# Patient Record
Sex: Female | Born: 1980 | Race: White | Hispanic: No | Smoking: Never smoker
Health system: Southern US, Community
[De-identification: ages and names within clinical notes are randomized; demographics above are authoritative.]

## PROBLEM LIST (undated history)

## (undated) DIAGNOSIS — F32A Depression, unspecified: Secondary | ICD-10-CM

## (undated) DIAGNOSIS — F329 Major depressive disorder, single episode, unspecified: Secondary | ICD-10-CM

## (undated) DIAGNOSIS — K219 Gastro-esophageal reflux disease without esophagitis: Secondary | ICD-10-CM

## (undated) DIAGNOSIS — R87619 Unspecified abnormal cytological findings in specimens from cervix uteri: Secondary | ICD-10-CM

## (undated) DIAGNOSIS — Z8742 Personal history of other diseases of the female genital tract: Secondary | ICD-10-CM

## (undated) DIAGNOSIS — IMO0002 Reserved for concepts with insufficient information to code with codable children: Secondary | ICD-10-CM

## (undated) HISTORY — DX: Gastro-esophageal reflux disease without esophagitis: K21.9

## (undated) HISTORY — DX: Depression, unspecified: F32.A

## (undated) HISTORY — DX: Major depressive disorder, single episode, unspecified: F32.9

## (undated) HISTORY — PX: CHOLECYSTECTOMY: SHX55

---

## 2010-08-02 ENCOUNTER — Other Ambulatory Visit (HOSPITAL_COMMUNITY)
Admission: RE | Admit: 2010-08-02 | Discharge: 2010-08-02 | Disposition: A | Discharge status: Home / Self Care | Payer: 59 | Source: Ambulatory Visit | Attending: Obstetrics and Gynecology | Admitting: Obstetrics and Gynecology

## 2010-08-02 DIAGNOSIS — Z113 Encounter for screening for infections with a predominantly sexual mode of transmission: Secondary | ICD-10-CM | POA: Insufficient documentation

## 2010-08-02 DIAGNOSIS — Z01419 Encounter for gynecological examination (general) (routine) without abnormal findings: Secondary | ICD-10-CM | POA: Insufficient documentation

## 2010-08-02 LAB — HIV ANTIBODY (ROUTINE TESTING W REFLEX): HIV: NONREACTIVE

## 2010-08-02 LAB — RPR: RPR: NONREACTIVE

## 2010-08-02 LAB — ABO/RH

## 2010-08-02 LAB — RUBELLA ANTIBODY, IGM: Rubella: IMMUNE

## 2011-02-21 ENCOUNTER — Other Ambulatory Visit: Payer: Self-pay | Admitting: Obstetrics and Gynecology

## 2011-02-23 ENCOUNTER — Encounter (HOSPITAL_COMMUNITY): Payer: Self-pay

## 2011-02-25 ENCOUNTER — Inpatient Hospital Stay (HOSPITAL_COMMUNITY): Payer: 59 | Admitting: Anesthesiology

## 2011-02-25 ENCOUNTER — Encounter (HOSPITAL_COMMUNITY): Payer: Self-pay

## 2011-02-25 ENCOUNTER — Inpatient Hospital Stay (HOSPITAL_COMMUNITY)
Admission: AD | Admit: 2011-02-25 | Discharge: 2011-03-01 | DRG: 765 | Disposition: A | Discharge status: Home / Self Care | Payer: 59 | Source: Ambulatory Visit | Attending: Obstetrics and Gynecology | Admitting: Obstetrics and Gynecology

## 2011-02-25 ENCOUNTER — Inpatient Hospital Stay (HOSPITAL_COMMUNITY): Admission: RE | Admit: 2011-02-25 | Payer: 59 | Source: Ambulatory Visit

## 2011-02-25 ENCOUNTER — Other Ambulatory Visit: Payer: Self-pay | Admitting: Obstetrics and Gynecology

## 2011-02-25 ENCOUNTER — Encounter (HOSPITAL_COMMUNITY)
Admission: AD | Disposition: A | Discharge status: Home / Self Care | Payer: Self-pay | Source: Ambulatory Visit | Attending: Obstetrics and Gynecology

## 2011-02-25 ENCOUNTER — Encounter (HOSPITAL_COMMUNITY): Payer: Self-pay | Admitting: *Deleted

## 2011-02-25 ENCOUNTER — Encounter (HOSPITAL_COMMUNITY): Payer: Self-pay | Admitting: Pharmacist

## 2011-02-25 ENCOUNTER — Encounter (HOSPITAL_COMMUNITY): Payer: Self-pay | Admitting: Anesthesiology

## 2011-02-25 DIAGNOSIS — O321XX0 Maternal care for breech presentation, not applicable or unspecified: Principal | ICD-10-CM | POA: Diagnosis present

## 2011-02-25 DIAGNOSIS — O139 Gestational [pregnancy-induced] hypertension without significant proteinuria, unspecified trimester: Secondary | ICD-10-CM | POA: Diagnosis present

## 2011-02-25 HISTORY — DX: Reserved for concepts with insufficient information to code with codable children: IMO0002

## 2011-02-25 HISTORY — DX: Personal history of other diseases of the female genital tract: Z87.42

## 2011-02-25 HISTORY — DX: Unspecified abnormal cytological findings in specimens from cervix uteri: R87.619

## 2011-02-25 LAB — CBC
HCT: 33.9 % — ABNORMAL LOW (ref 36.0–46.0)
Hemoglobin: 11.2 g/dL — ABNORMAL LOW (ref 12.0–15.0)
MCHC: 33 g/dL (ref 30.0–36.0)
Platelets: 242 10*3/uL (ref 150–400)
RBC: 3.92 MIL/uL (ref 3.87–5.11)
RDW: 14.6 % (ref 11.5–15.5)
WBC: 8.4 10*3/uL (ref 4.0–10.5)
WBC: 18.1 10*3/uL — ABNORMAL HIGH (ref 4.0–10.5)

## 2011-02-25 LAB — URINALYSIS, ROUTINE W REFLEX MICROSCOPIC
Bilirubin Urine: NEGATIVE
Ketones, ur: 40 mg/dL — AB
Nitrite: NEGATIVE
Urobilinogen, UA: 0.2 mg/dL (ref 0.0–1.0)

## 2011-02-25 LAB — URINE MICROSCOPIC-ADD ON

## 2011-02-25 LAB — COMPREHENSIVE METABOLIC PANEL
ALT: 69 U/L — ABNORMAL HIGH (ref 0–35)
Alkaline Phosphatase: 152 U/L — ABNORMAL HIGH (ref 39–117)
BUN: 12 mg/dL (ref 6–23)
CO2: 23 mEq/L (ref 19–32)
Chloride: 102 mEq/L (ref 96–112)
GFR calc Af Amer: >90 mL/min (ref >90)
GFR calc non Af Amer: >90 mL/min (ref >90)
Glucose, Bld: 92 mg/dL (ref 70–99)
Potassium: 3.7 mEq/L (ref 3.5–5.1)
Sodium: 135 mEq/L (ref 135–145)
Total Bilirubin: 0.3 mg/dL (ref 0.3–1.2)

## 2011-02-25 LAB — TYPE AND SCREEN: Antibody Screen: NEGATIVE

## 2011-02-25 LAB — URIC ACID: Uric Acid, Serum: 6.8 mg/dL (ref 2.4–7.0)

## 2011-02-25 SURGERY — Surgical Case
Anesthesia: Spinal | Site: Abdomen | Wound class: Clean Contaminated

## 2011-02-25 MED ORDER — METOCLOPRAMIDE HCL 5 MG/ML IJ SOLN: 10.0000 mg | Freq: Three times a day (TID) | INTRAMUSCULAR | Status: DC | PRN

## 2011-02-25 MED ORDER — CEFAZOLIN SODIUM 1-5 GM-% IV SOLN
1.0000 g | INTRAVENOUS | Status: AC
  Administered 2011-02-25: 1 g via INTRAVENOUS
  Filled 2011-02-25: qty 50

## 2011-02-25 MED ORDER — EPHEDRINE SULFATE 50 MG/ML IJ SOLN
INTRAMUSCULAR | Status: DC | PRN
  Administered 2011-02-25: 10 mg via INTRAVENOUS
  Administered 2011-02-25: 5 mg via INTRAVENOUS
  Administered 2011-02-25: 10 mg via INTRAVENOUS
  Administered 2011-02-25: 5 mg via INTRAVENOUS

## 2011-02-25 MED ORDER — ONDANSETRON HCL 4 MG/2ML IJ SOLN
INTRAMUSCULAR | Status: AC
  Filled 2011-02-25: qty 2

## 2011-02-25 MED ORDER — PHENYLEPHRINE HCL 10 MG/ML IJ SOLN
INTRAMUSCULAR | Status: DC | PRN
  Administered 2011-02-25: 80 ug via INTRAVENOUS
  Administered 2011-02-25 (×3): 40 ug via INTRAVENOUS

## 2011-02-25 MED ORDER — FENTANYL CITRATE 0.05 MG/ML IJ SOLN
INTRAMUSCULAR | Status: AC
  Administered 2011-02-25: 50 ug via INTRAVENOUS
  Filled 2011-02-25: qty 2

## 2011-02-25 MED ORDER — SCOPOLAMINE 1 MG/3DAYS TD PT72
MEDICATED_PATCH | TRANSDERMAL | Status: AC
  Filled 2011-02-25: qty 1

## 2011-02-25 MED ORDER — OXYTOCIN 10 UNIT/ML IJ SOLN
INTRAMUSCULAR | Status: AC
  Filled 2011-02-25: qty 2

## 2011-02-25 MED ORDER — DIPHENHYDRAMINE HCL 50 MG/ML IJ SOLN: 12.5000 mg | INTRAMUSCULAR | Status: DC | PRN

## 2011-02-25 MED ORDER — PHENYLEPHRINE 40 MCG/ML (10ML) SYRINGE FOR IV PUSH (FOR BLOOD PRESSURE SUPPORT)
PREFILLED_SYRINGE | INTRAVENOUS | Status: AC
  Filled 2011-02-25: qty 5

## 2011-02-25 MED ORDER — OXYCODONE-ACETAMINOPHEN 5-325 MG PO TABS
1.0000 | ORAL_TABLET | ORAL | Status: DC | PRN
  Administered 2011-02-26: 1 via ORAL
  Administered 2011-02-26: 2 via ORAL
  Administered 2011-02-26: 1 via ORAL
  Administered 2011-02-27 – 2011-02-28 (×6): 2 via ORAL
  Administered 2011-02-28 (×2): 1 via ORAL
  Administered 2011-03-01 (×2): 2 via ORAL
  Filled 2011-02-25: qty 1
  Filled 2011-02-25: qty 2
  Filled 2011-02-25: qty 1
  Filled 2011-02-25: qty 2
  Filled 2011-02-25: qty 1
  Filled 2011-02-25 (×2): qty 2
  Filled 2011-02-25: qty 1
  Filled 2011-02-25 (×2): qty 2
  Filled 2011-02-25 (×2): qty 1
  Filled 2011-02-25 (×2): qty 2

## 2011-02-25 MED ORDER — OXYTOCIN 20 UNITS IN LACTATED RINGERS INFUSION - SIMPLE
125.0000 mL/h | INTRAVENOUS | Status: AC
  Administered 2011-02-26: 125 mL/h via INTRAVENOUS
  Filled 2011-02-25 (×2): qty 1000

## 2011-02-25 MED ORDER — LACTATED RINGERS IV SOLN
INTRAVENOUS | Status: DC | PRN
  Administered 2011-02-25: 14:00:00 via INTRAVENOUS

## 2011-02-25 MED ORDER — KETOROLAC TROMETHAMINE 30 MG/ML IJ SOLN
30.0000 mg | Freq: Four times a day (QID) | INTRAMUSCULAR | Status: AC | PRN
  Filled 2011-02-25: qty 1

## 2011-02-25 MED ORDER — DIPHENHYDRAMINE HCL 25 MG PO CAPS
25.0000 mg | ORAL_CAPSULE | ORAL | Status: DC | PRN
  Administered 2011-02-26: 25 mg via ORAL
  Filled 2011-02-25: qty 1

## 2011-02-25 MED ORDER — HYDROMORPHONE HCL PF 1 MG/ML IJ SOLN
0.5000 mg | INTRAMUSCULAR | Status: AC
  Administered 2011-02-25 (×2): 0.5 mg via INTRAVENOUS

## 2011-02-25 MED ORDER — MEPERIDINE HCL 25 MG/ML IJ SOLN
INTRAMUSCULAR | Status: AC
  Administered 2011-02-25: 6.25 mg via INTRAVENOUS
  Filled 2011-02-25: qty 1

## 2011-02-25 MED ORDER — KETOROLAC TROMETHAMINE 60 MG/2ML IM SOLN
60.0000 mg | Freq: Once | INTRAMUSCULAR | Status: AC | PRN
  Administered 2011-02-25: 60 mg via INTRAMUSCULAR

## 2011-02-25 MED ORDER — SIMETHICONE 80 MG PO CHEW: 80.0000 mg | CHEWABLE_TABLET | ORAL | Status: DC | PRN

## 2011-02-25 MED ORDER — LACTATED RINGERS IV SOLN: INTRAVENOUS | Status: DC

## 2011-02-25 MED ORDER — LANOLIN HYDROUS EX OINT: 1.0000 "application " | TOPICAL_OINTMENT | CUTANEOUS | Status: DC | PRN

## 2011-02-25 MED ORDER — HYDROMORPHONE HCL PF 1 MG/ML IJ SOLN
INTRAMUSCULAR | Status: AC
  Administered 2011-02-25: 0.5 mg via INTRAVENOUS
  Filled 2011-02-25: qty 1

## 2011-02-25 MED ORDER — MORPHINE SULFATE (PF) 0.5 MG/ML IJ SOLN
INTRAMUSCULAR | Status: DC | PRN
  Administered 2011-02-25: .1 mg via INTRATHECAL

## 2011-02-25 MED ORDER — SCOPOLAMINE 1 MG/3DAYS TD PT72
1.0000 | MEDICATED_PATCH | Freq: Once | TRANSDERMAL | Status: AC
  Administered 2011-02-25: 1.5 mg via TRANSDERMAL

## 2011-02-25 MED ORDER — TETANUS-DIPHTH-ACELL PERTUSSIS 5-2.5-18.5 LF-MCG/0.5 IM SUSP
0.5000 mL | Freq: Once | INTRAMUSCULAR | Status: AC
  Administered 2011-02-26: 0.5 mL via INTRAMUSCULAR
  Filled 2011-02-25 (×2): qty 0.5

## 2011-02-25 MED ORDER — DIPHENHYDRAMINE HCL 50 MG/ML IJ SOLN: 25.0000 mg | INTRAMUSCULAR | Status: DC | PRN

## 2011-02-25 MED ORDER — BUPIVACAINE IN DEXTROSE 0.75-8.25 % IT SOLN
INTRATHECAL | Status: DC | PRN
  Administered 2011-02-25: 11.75 mg via INTRATHECAL

## 2011-02-25 MED ORDER — OXYTOCIN 20 UNITS IN LACTATED RINGERS INFUSION - SIMPLE
INTRAVENOUS | Status: DC | PRN
  Administered 2011-02-25: 20 [IU] via INTRAVENOUS

## 2011-02-25 MED ORDER — OXYTOCIN 20 UNITS IN LACTATED RINGERS INFUSION - SIMPLE
INTRAVENOUS | Status: AC
  Administered 2011-02-25: 20 [IU] via INTRAVENOUS
  Filled 2011-02-25: qty 1000

## 2011-02-25 MED ORDER — PRENATAL MULTIVITAMIN CH
1.0000 | ORAL_TABLET | Freq: Every day | ORAL | Status: DC
  Administered 2011-02-26 – 2011-03-01 (×4): 1 via ORAL
  Filled 2011-02-25 (×4): qty 1

## 2011-02-25 MED ORDER — SIMETHICONE 80 MG PO CHEW
80.0000 mg | CHEWABLE_TABLET | Freq: Three times a day (TID) | ORAL | Status: DC
  Administered 2011-02-25 – 2011-03-01 (×13): 80 mg via ORAL

## 2011-02-25 MED ORDER — DIBUCAINE 1 % RE OINT: 1.0000 "application " | TOPICAL_OINTMENT | RECTAL | Status: DC | PRN

## 2011-02-25 MED ORDER — KETOROLAC TROMETHAMINE 60 MG/2ML IM SOLN
INTRAMUSCULAR | Status: AC
  Administered 2011-02-25: 60 mg via INTRAMUSCULAR
  Filled 2011-02-25: qty 2

## 2011-02-25 MED ORDER — ONDANSETRON HCL 4 MG/2ML IJ SOLN: 4.0000 mg | INTRAMUSCULAR | Status: DC | PRN

## 2011-02-25 MED ORDER — IBUPROFEN 600 MG PO TABS
600.0000 mg | ORAL_TABLET | Freq: Four times a day (QID) | ORAL | Status: DC
  Administered 2011-02-26 – 2011-03-01 (×15): 600 mg via ORAL
  Filled 2011-02-25 (×11): qty 1
  Filled 2011-02-25: qty 2
  Filled 2011-02-25 (×2): qty 1

## 2011-02-25 MED ORDER — ONDANSETRON HCL 4 MG/2ML IJ SOLN: 4.0000 mg | Freq: Three times a day (TID) | INTRAMUSCULAR | Status: DC | PRN

## 2011-02-25 MED ORDER — SENNOSIDES-DOCUSATE SODIUM 8.6-50 MG PO TABS
2.0000 | ORAL_TABLET | Freq: Every day | ORAL | Status: DC
  Administered 2011-02-25 – 2011-02-28 (×4): 2 via ORAL

## 2011-02-25 MED ORDER — MEPERIDINE HCL 25 MG/ML IJ SOLN
6.2500 mg | INTRAMUSCULAR | Status: DC | PRN
  Administered 2011-02-25: 6.25 mg via INTRAVENOUS

## 2011-02-25 MED ORDER — CEFAZOLIN SODIUM 1-5 GM-% IV SOLN
1.0000 g | Freq: Four times a day (QID) | INTRAVENOUS | Status: AC
  Administered 2011-02-25 – 2011-02-26 (×2): 1 g via INTRAVENOUS
  Filled 2011-02-25 (×2): qty 50

## 2011-02-25 MED ORDER — EPHEDRINE 5 MG/ML INJ
INTRAVENOUS | Status: AC
  Filled 2011-02-25: qty 10

## 2011-02-25 MED ORDER — SODIUM CHLORIDE 0.9 % IJ SOLN
3.0000 mL | INTRAMUSCULAR | Status: DC | PRN
  Administered 2011-02-26: 3 mL via INTRAVENOUS

## 2011-02-25 MED ORDER — ONDANSETRON HCL 4 MG PO TABS: 4.0000 mg | ORAL_TABLET | ORAL | Status: DC | PRN

## 2011-02-25 MED ORDER — CITRIC ACID-SODIUM CITRATE 334-500 MG/5ML PO SOLN
30.0000 mL | Freq: Once | ORAL | Status: AC
  Administered 2011-02-25: 30 mL via ORAL
  Filled 2011-02-25: qty 15

## 2011-02-25 MED ORDER — CEFAZOLIN SODIUM 1-5 GM-% IV SOLN
1.0000 g | Freq: Four times a day (QID) | INTRAVENOUS | Status: DC
  Filled 2011-02-25: qty 50

## 2011-02-25 MED ORDER — FAMOTIDINE IN NACL 20-0.9 MG/50ML-% IV SOLN
20.0000 mg | Freq: Once | INTRAVENOUS | Status: AC
  Administered 2011-02-25: 20 mg via INTRAVENOUS
  Filled 2011-02-25: qty 50

## 2011-02-25 MED ORDER — ZOLPIDEM TARTRATE 5 MG PO TABS: 5.0000 mg | ORAL_TABLET | Freq: Every evening | ORAL | Status: DC | PRN

## 2011-02-25 MED ORDER — FENTANYL CITRATE 0.05 MG/ML IJ SOLN
INTRAMUSCULAR | Status: DC | PRN
  Administered 2011-02-25: 15 ug via INTRATHECAL

## 2011-02-25 MED ORDER — SODIUM CHLORIDE 0.9 % IV SOLN
1.0000 ug/kg/h | INTRAVENOUS | Status: DC | PRN
  Filled 2011-02-25: qty 2.5

## 2011-02-25 MED ORDER — ONDANSETRON HCL 4 MG/2ML IJ SOLN
INTRAMUSCULAR | Status: DC | PRN
  Administered 2011-02-25: 4 mg via INTRAVENOUS

## 2011-02-25 MED ORDER — MENTHOL 3 MG MT LOZG: 1.0000 | LOZENGE | OROMUCOSAL | Status: DC | PRN

## 2011-02-25 MED ORDER — DIPHENHYDRAMINE HCL 25 MG PO CAPS: 25.0000 mg | ORAL_CAPSULE | Freq: Four times a day (QID) | ORAL | Status: DC | PRN

## 2011-02-25 MED ORDER — NALOXONE HCL 0.4 MG/ML IJ SOLN: 0.4000 mg | INTRAMUSCULAR | Status: DC | PRN

## 2011-02-25 MED ORDER — IBUPROFEN 600 MG PO TABS: 600.0000 mg | ORAL_TABLET | Freq: Four times a day (QID) | ORAL | Status: DC | PRN

## 2011-02-25 MED ORDER — WITCH HAZEL-GLYCERIN EX PADS: 1.0000 "application " | MEDICATED_PAD | CUTANEOUS | Status: DC | PRN

## 2011-02-25 MED ORDER — NALBUPHINE HCL 10 MG/ML IJ SOLN: 5.0000 mg | INTRAMUSCULAR | Status: DC | PRN

## 2011-02-25 MED ORDER — FENTANYL CITRATE 0.05 MG/ML IJ SOLN
25.0000 ug | INTRAMUSCULAR | Status: DC | PRN
  Administered 2011-02-25 (×2): 50 ug via INTRAVENOUS

## 2011-02-25 MED ORDER — MAGNESIUM SULFATE 40 G IN LACTATED RINGERS - SIMPLE
2.0000 g/h | INTRAVENOUS | Status: DC
  Filled 2011-02-25 (×2): qty 500

## 2011-02-25 MED ORDER — MAGNESIUM SULFATE BOLUS VIA INFUSION
4.0000 g | Freq: Once | INTRAVENOUS | Status: AC
  Administered 2011-02-25: 4 g via INTRAVENOUS
  Filled 2011-02-25: qty 500

## 2011-02-25 SURGICAL SUPPLY — 27 items
CLOTH BEACON ORANGE TIMEOUT ST (SAFETY) ×2 IMPLANT
CONTAINER PREFILL 10% NBF 15ML (MISCELLANEOUS) IMPLANT
DRESSING TELFA 8X3 (GAUZE/BANDAGES/DRESSINGS) IMPLANT
DRSG COVADERM 4X10 (GAUZE/BANDAGES/DRESSINGS) ×2 IMPLANT
DRSG PAD ABDOMINAL 8X10 ST (GAUZE/BANDAGES/DRESSINGS) ×2 IMPLANT
ELECT REM PT RETURN 9FT ADLT (ELECTROSURGICAL) ×2
ELECTRODE REM PT RTRN 9FT ADLT (ELECTROSURGICAL) ×1 IMPLANT
EXTRACTOR VACUUM M CUP 4 TUBE (SUCTIONS) IMPLANT
GAUZE SPONGE 4X4 12PLY STRL LF (GAUZE/BANDAGES/DRESSINGS) ×2 IMPLANT
GLOVE BIO SURGEON STRL SZ8 (GLOVE) ×4 IMPLANT
GOWN PREVENTION PLUS LG XLONG (DISPOSABLE) ×2 IMPLANT
KIT ABG SYR 3ML LUER SLIP (SYRINGE) ×2 IMPLANT
NEEDLE HYPO 25X5/8 SAFETYGLIDE (NEEDLE) ×2 IMPLANT
NS IRRIG 1000ML POUR BTL (IV SOLUTION) ×2 IMPLANT
PACK C SECTION WH (CUSTOM PROCEDURE TRAY) ×2 IMPLANT
PAD ABD 7.5X8 STRL (GAUZE/BANDAGES/DRESSINGS) IMPLANT
SLEEVE SCD COMPRESS KNEE MED (MISCELLANEOUS) IMPLANT
STAPLER VISISTAT 35W (STAPLE) IMPLANT
STRIP CLOSURE SKIN 1/4X4 (GAUZE/BANDAGES/DRESSINGS) ×2 IMPLANT
SUT MNCRL 0 VIOLET CTX 36 (SUTURE) ×4 IMPLANT
SUT MONOCRYL 0 CTX 36 (SUTURE) ×4
SUT PDS AB 0 CTX 60 (SUTURE) ×2 IMPLANT
SUT PLAIN 0 NONE (SUTURE) IMPLANT
TAPE CLOTH SURG 4X10 WHT LF (GAUZE/BANDAGES/DRESSINGS) ×2 IMPLANT
TOWEL OR 17X24 6PK STRL BLUE (TOWEL DISPOSABLE) ×4 IMPLANT
TRAY FOLEY CATH 14FR (SET/KITS/TRAYS/PACK) ×2 IMPLANT
WATER STERILE IRR 1000ML POUR (IV SOLUTION) ×2 IMPLANT

## 2011-02-25 NOTE — Anesthesia Procedure Notes (Signed)
Spinal  Patient location during procedure: OR Start time: 02/25/2011 1:50 PM Staffing Performed by: anesthesiologist  Preanesthetic Checklist Completed: patient identified, site marked, surgical consent, pre-op evaluation, timeout performed, IV checked, risks and benefits discussed and monitors and equipment checked Spinal Block Patient position: sitting Prep: site prepped and draped and DuraPrep Patient monitoring: heart rate, cardiac monitor, continuous pulse ox and blood pressure Approach: midline Location: L3-4 Injection technique: single-shot Needle Needle type: Sprotte  Needle gauge: 24 G Needle length: 9 cm Assessment Sensory level: T4 Additional Notes Clear free flow CSF on first attempt.  Patient tolerated procedure well.  Jasmine December, MD

## 2011-02-25 NOTE — Addendum Note (Signed)
Addendum  created 02/25/11 1737 by Aadit Hagood L. Rodman Pickle, MD   Modules edited:Orders, PRL Based Order Sets

## 2011-02-25 NOTE — Brief Op Note (Signed)
02/25/2011  2:29 PM  PATIENT:  Jessica Barron  31 y.o. female  PRE-OPERATIVE DIAGNOSIS:  Breech in Labor  POST-OPERATIVE DIAGNOSIS:  Breech in Labor  PROCEDURE:  Procedure(s): Low Transverse CESAREAN SECTION  SURGEON:  Surgeon(s): Roselle Locus II, MD  PHYSICIAN ASSISTANT:   ASSISTANTS: none   ANESTHESIA:   spinal  EBL:  Total I/O In: 1200 [I.V.:1200] Out: 800 [Urine:50; Blood:750]  BLOOD ADMINISTERED:none  DRAINS: Urinary Catheter (Foley)   LOCAL MEDICATIONS USED:  NONE  SPECIMEN:  Source of Specimen:  placenta  DISPOSITION OF SPECIMEN:  PATHOLOGY  COUNTS:  YES  TOURNIQUET:  * No tourniquets in log *  DICTATION: .Other Dictation: Dictation Number [id number]  PLAN OF CARE: Admit to inpatient   PATIENT DISPOSITION:  PACU - hemodynamically stable.   Delay start of Pharmacological VTE agent (>24hrs) due to surgical blood loss or risk of bleeding:  {YES/NO/NOT APPLICABLE:20182

## 2011-02-25 NOTE — Transfer of Care (Signed)
Immediate Anesthesia Transfer of Care Note  Patient: Jessica Barron  Procedure(s) Performed:  CESAREAN SECTION  Patient Location: PACU  Anesthesia Type: Spinal  Level of Consciousness: awake, alert  and oriented  Airway & Oxygen Therapy: Patient Spontanous Breathing  Post-op Assessment: Report given to PACU RN and Post -op Vital signs reviewed and stable  Post vital signs: Reviewed and stable  Complications: No apparent anesthesia complications

## 2011-02-25 NOTE — Anesthesia Postprocedure Evaluation (Signed)
Anesthesia Post Note  Patient: Jessica Barron  Procedure(s) Performed:  CESAREAN SECTION  Anesthesia type: Spinal  Patient location: PACU  Post pain: Pain level controlled  Post assessment: Post-op Vital signs reviewed  Last Vitals:  Filed Vitals:   02/25/11 1500  BP: 138/74  Pulse: 73  Temp:   Resp: 16    Post vital signs: Reviewed  Level of consciousness: awake  Complications: No apparent anesthesia complications

## 2011-02-25 NOTE — H&P (Signed)
Jessica Barron is a 31 y.o. female presenting for cervix at 6 cm per Dr Lynnell Dike exam in office today.  No ROM/CNS change/bleeding/epigastric pain.  Pt known to be breech on multiple exams in office and today with Dr Vincente Poli. Maternal Medical History:  Contractions: Frequency: irregular.   Perceived severity is moderate.    Fetal activity: Perceived fetal activity is normal.      OB History    Grav Para Term Preterm Abortions TAB SAB Ect Mult Living   1 0 0 0 0 0 0 0 0 0      Past Medical History  Diagnosis Date  . Abnormal Pap smear     repeat WNL  . History of PCOS   . Breech presentation current preg edc 03/10/11   Past Surgical History  Procedure Date  . Cholecystectomy    Family History: family history is negative for Anesthesia problems. Social History:  reports that she has never smoked. She does not have any smokeless tobacco history on file. She reports that she does not drink alcohol or use illicit drugs.  Review of Systems  Eyes: Negative for blurred vision.  Gastrointestinal: Negative for abdominal pain.  Neurological: Negative for headaches.    Dilation: 6 Exam by:: in office by Dr. Eustaquio Boyden Blood pressure 147/86, pulse 88, last menstrual period 06/03/2010. Maternal Exam:  Uterine Assessment: Contraction strength is moderate.  Contraction frequency is irregular.   Abdomen: Patient reports no abdominal tenderness.   Fetal Exam Fetal Monitor Review: Pattern: accelerations present.       Physical Exam  Cardiovascular: Normal rate and regular rhythm.   Respiratory: Effort normal and breath sounds normal.  Neurological: She has normal reflexes.    Prenatal labs: ABO, Rh: O/Positive/-- (07/09 1049) Antibody:   Rubella: Immune (07/09 1049) RPR: Nonreactive (07/09 1049)  HBsAg: Negative (07/09 1049)  HIV: Non-reactive (07/09 1049)  GBS: Negative (01/11 0000)   Assessment/Plan: 31 yo G1P0 at 57 1/7 weeks with breech and 6 cm cervix.  D/W pt/husband  surgery and risks-infection, bleeding / transfusion-HIV/Hep, organ damage, DVT/PE, pneumonia.  All questions answered.  Infant Doane II,Elizabeht Suto E 02/25/2011, 1:29 PM

## 2011-02-25 NOTE — Anesthesia Preprocedure Evaluation (Signed)
Anesthesia Evaluation  Patient identified by MRN, date of birth, ID band Patient awake    Reviewed: Allergy & Precautions, H&P , NPO status , Patient's Chart, lab work & pertinent test results, reviewed documented beta blocker date and time   History of Anesthesia Complications Negative for: history of anesthetic complications  Airway Mallampati: II TM Distance: >3 FB Neck ROM: full    Dental  (+) Teeth Intact   Pulmonary sleep apnea ("mild". no CPAP) ,  clear to auscultation        Cardiovascular neg cardio ROS regular Normal    Neuro/Psych Negative Neurological ROS  Negative Psych ROS   GI/Hepatic negative GI ROS, Neg liver ROS,   Endo/Other  Morbid obesityPCOS  Renal/GU negative Renal ROS  Genitourinary negative   Musculoskeletal   Abdominal   Peds  Hematology negative hematology ROS (+)   Anesthesia Other Findings Ate at 7 am  Reproductive/Obstetrics (+) Pregnancy (breech, in labor)                           Anesthesia Physical Anesthesia Plan  ASA: III and Emergent  Anesthesia Plan: Spinal   Post-op Pain Management:    Induction:   Airway Management Planned:   Additional Equipment:   Intra-op Plan:   Post-operative Plan:   Informed Consent: I have reviewed the patients History and Physical, chart, labs and discussed the procedure including the risks, benefits and alternatives for the proposed anesthesia with the patient or authorized representative who has indicated his/her understanding and acceptance.   Dental Advisory Given  Plan Discussed with: Surgeon and CRNA  Anesthesia Plan Comments:         Anesthesia Quick Evaluation

## 2011-02-25 NOTE — Progress Notes (Signed)
No HA/vision change/epigastric pain  BPs steadily rising in PACU  Blood pressure 158/89, pulse 86, temperature 98.2 F (36.8 C), temperature source Oral, resp. rate 20, height 5\' 2"  (1.575 m), weight 124.286 kg (274 lb), last menstrual period 06/03/2010, SpO2 98.00%, unknown if currently breastfeeding.  Lungs CTA Abd no epigastric tenderness DTR  2+, no clonus  Results for orders placed during the hospital encounter of 02/25/11 (from the past 24 hour(s))  CBC     Status: Normal   Collection Time   02/25/11 12:39 PM      Component Value Range   WBC 8.4  4.0 - 10.5 (K/uL)   RBC 4.26  3.87 - 5.11 (MIL/uL)   Hemoglobin 12.3  12.0 - 15.0 (g/dL)   HCT 98.1  19.1 - 47.8 (%)   MCV 86.6  78.0 - 100.0 (fL)   MCH 28.9  26.0 - 34.0 (pg)   MCHC 33.3  30.0 - 36.0 (g/dL)   RDW 29.5  62.1 - 30.8 (%)   Platelets 242  150 - 400 (K/uL)  TYPE AND SCREEN     Status: Normal   Collection Time   02/25/11 12:39 PM      Component Value Range   ABO/RH(D) O POS     Antibody Screen NEG     Sample Expiration 02/28/2011    ABO/RH     Status: Normal   Collection Time   02/25/11 12:39 PM      Component Value Range   ABO/RH(D) O POS    URINALYSIS, ROUTINE W REFLEX MICROSCOPIC     Status: Abnormal   Collection Time   02/25/11  5:00 PM      Component Value Range   Color, Urine AMBER (*) YELLOW    APPearance HAZY (*) CLEAR    Specific Gravity, Urine 1.025  1.005 - 1.030    pH 6.5  5.0 - 8.0    Glucose, UA NEGATIVE  NEGATIVE (mg/dL)   Hgb urine dipstick MODERATE (*) NEGATIVE    Bilirubin Urine NEGATIVE  NEGATIVE    Ketones, ur 40 (*) NEGATIVE (mg/dL)   Protein, ur 657 (*) NEGATIVE (mg/dL)   Urobilinogen, UA 0.2  0.0 - 1.0 (mg/dL)   Nitrite NEGATIVE  NEGATIVE    Leukocytes, UA NEGATIVE  NEGATIVE   URINE MICROSCOPIC-ADD ON     Status: Abnormal   Collection Time   02/25/11  5:00 PM      Component Value Range   Squamous Epithelial / LPF MANY (*) RARE    WBC, UA 7-10  <3 (WBC/hpf)   RBC / HPF 21-50  <3  (RBC/hpf)   Bacteria, UA MANY (*) RARE    Urine-Other MUCOUS PRESENT    CBC     Status: Abnormal   Collection Time   02/25/11  5:01 PM      Component Value Range   WBC 18.1 (*) 4.0 - 10.5 (K/uL)   RBC 3.92  3.87 - 5.11 (MIL/uL)   Hemoglobin 11.2 (*) 12.0 - 15.0 (g/dL)   HCT 84.6 (*) 96.2 - 46.0 (%)   MCV 86.5  78.0 - 100.0 (fL)   MCH 28.6  26.0 - 34.0 (pg)   MCHC 33.0  30.0 - 36.0 (g/dL)   RDW 95.2  84.1 - 32.4 (%)   Platelets 219  150 - 400 (K/uL)  COMPREHENSIVE METABOLIC PANEL     Status: Abnormal   Collection Time   02/25/11  5:01 PM      Component Value Range  Sodium 135  135 - 145 (mEq/L)   Potassium 3.7  3.5 - 5.1 (mEq/L)   Chloride 102  96 - 112 (mEq/L)   CO2 23  19 - 32 (mEq/L)   Glucose, Bld 92  70 - 99 (mg/dL)   BUN 12  6 - 23 (mg/dL)   Creatinine, Ser 2.13  0.50 - 1.10 (mg/dL)   Calcium 9.3  8.4 - 08.6 (mg/dL)   Total Protein 5.6 (*) 6.0 - 8.3 (g/dL)   Albumin 2.4 (*) 3.5 - 5.2 (g/dL)   AST 48 (*) 0 - 37 (U/L)   ALT 69 (*) 0 - 35 (U/L)   Alkaline Phosphatase 152 (*) 39 - 117 (U/L)   Total Bilirubin 0.3  0.3 - 1.2 (mg/dL)   GFR calc non Af Amer >90  >90 (mL/min)   GFR calc Af Amer >90  >90 (mL/min)  URIC ACID     Status: Normal   Collection Time   02/25/11  5:01 PM      Component Value Range   Uric Acid, Serum 6.8  2.4 - 7.0 (mg/dL)  A: PP preeclampsia with elevated LFT and proteinuria  P: D/W pt/husband     Will begin magnesium sulfate seizure prophylaxis     Check labs in am

## 2011-02-26 LAB — COMPREHENSIVE METABOLIC PANEL
ALT: 65 U/L — ABNORMAL HIGH (ref 0–35)
ALT: 62 U/L — ABNORMAL HIGH (ref 0–35)
Albumin: 1.9 g/dL — ABNORMAL LOW (ref 3.5–5.2)
Alkaline Phosphatase: 133 U/L — ABNORMAL HIGH (ref 39–117)
Alkaline Phosphatase: 130 U/L — ABNORMAL HIGH (ref 39–117)
BUN: 8 mg/dL (ref 6–23)
CO2: 26 mEq/L (ref 19–32)
Calcium: 7.7 mg/dL — ABNORMAL LOW (ref 8.4–10.5)
Creatinine, Ser: 0.77 mg/dL (ref 0.50–1.10)
GFR calc Af Amer: >90 mL/min (ref >90)
Glucose, Bld: 76 mg/dL (ref 70–99)
Glucose, Bld: 88 mg/dL (ref 70–99)
Potassium: 4.1 mEq/L (ref 3.5–5.1)
Sodium: 134 mEq/L — ABNORMAL LOW (ref 135–145)
Total Bilirubin: 0.3 mg/dL (ref 0.3–1.2)
Total Protein: 4.6 g/dL — ABNORMAL LOW (ref 6.0–8.3)

## 2011-02-26 LAB — CBC
HCT: 29.8 % — ABNORMAL LOW (ref 36.0–46.0)
Hemoglobin: 9.9 g/dL — ABNORMAL LOW (ref 12.0–15.0)
Hemoglobin: 9.6 g/dL — ABNORMAL LOW (ref 12.0–15.0)
MCH: 28.3 pg (ref 26.0–34.0)
MCHC: 31.8 g/dL (ref 30.0–36.0)
MCV: 87.9 fL (ref 78.0–100.0)
RBC: 3.39 MIL/uL — ABNORMAL LOW (ref 3.87–5.11)

## 2011-02-26 LAB — URIC ACID: Uric Acid, Serum: 7 mg/dL (ref 2.4–7.0)

## 2011-02-26 MED ORDER — MAGNESIUM SULFATE 40 G IN LACTATED RINGERS - SIMPLE
2.0000 g/h | INTRAVENOUS | Status: DC
  Administered 2011-02-26: 2 g/h via INTRAVENOUS

## 2011-02-26 NOTE — Progress Notes (Signed)
Ambulated to restroom, pericare done, pads, panties and gown changed. Foley d/c'd. Assisted back to bed, encouraged pt to cough and deep breath. Husband and infant at bedside.

## 2011-02-26 NOTE — Op Note (Signed)
Jessica Barron, Jessica Barron             ACCOUNT NO.:  [id number]  MEDICAL RECORD NO.:  [id number]  LOCATION:  9156                          FACILITY:  [facility]  PHYSICIAN:  Guy Sandifer. Henderson Cloud, M.D. DATE OF BIRTH:  08-30-1980  DATE OF PROCEDURE:  02/25/2011 DATE OF DISCHARGE:                              OPERATIVE REPORT   PREOPERATIVE DIAGNOSES: 1. Breech. 2. In labor.  POSTOPERATIVE DIAGNOSES: 1. Breech. 2. In labor.  PROCEDURE:  Low-transverse cesarean section.  SURGEON:  Guy Sandifer. Henderson Cloud, MD  ANESTHESIA:  Spinal.  ANESTHESIOLOGIST:  Dana Allan, MD  ESTIMATED BLOOD LOSS:  750 mL.  SPECIMENS:  Placenta to pathology.  FINDINGS:  Viable female infant, Apgars of 9 and 9 at 1 and 5 minutes respectively.  Birth weight 9 pounds 8 ounces.  Arterial cord pH pending.  INDICATIONS AND CONSENT:  This patient is a 31 year old, G1, P0, at 62- 1/7th weeks with known breech presentation.  She has had some mild contractions.  Examination in the office today is consistent with 6 cm and breech.  She was transferred to the hospital.  She was seen to be contracting irregularly.  Recommendation for delivery was made. Cesarean section was discussed with the patient.  Potential risks and complications were discussed preoperatively including not limited to, infection, organ damage, bleeding requiring transfusion of blood products with HIV and hepatitis acquisition, DVT, PE, pneumonia.  All questions were answered and consent was signed on the chart.  PROCEDURE IN DETAIL:  The patient was taken to the operating room, where she was identified, spinal anesthetic was placed by Dr. Rodman Pickle, and she was placed in dorsal supine position with a 15-degree left lateral wedge.  Time-out undertaken.  She was prepped.  Foley catheter was placed and the bladder was drained.  She was draped in a sterile fashion.  After for adequate spinal anesthesia, skin was entered through a Pfannenstiel incision.  Dissection  carried out in layers to the peritoneum.  Peritoneum was incised and extended superiorly and inferiorly.  Vesicouterine peritoneum was taken down cephalolaterally. The bladder flap was developed.  The bladder blade was placed.  Uterus was incised in a low transverse manner and the uterine cavity was entered bluntly with a hemostat.  Uterine incision was extended cephalad with fingers.  Rupture of membranes reveals clear fluid.  Baby was then delivered from the double footling breech position without difficulty. Good cry and tone was noted.  Cord was clamped and cut.  The baby was handed to awaiting pediatrics team.  Placenta was manually delivered. Sent to pathology.  Uterus was clean.  Uterus was closed in 2 running locking imbricating layers of 0-Monocryl suture which achieves good hemostasis.  Tubes and ovaries were normal bilaterally.  Anterior peritoneum was closed in running fashion with 0-Monocryl suture which was also used to reapproximate the pyramidalis muscle in the midline. Anterior rectus fascia was closed in a running fashion with 0-looped PDS suture.  Subcutaneous tissue was reapproximated with interrupted plain suture and the skin was closed with clips.  All sponge, instrument, and needle counts were correct.  The patient was transferred to the recovery room in stable condition.     Guy Sandifer Henderson Cloud, M.D.  JET/MEDQ  D:  02/25/2011  T:  02/26/2011  Job:  [id number]

## 2011-02-26 NOTE — Progress Notes (Signed)
Transferred to Rm 135 via wheelchair, husband and infant with pt

## 2011-02-26 NOTE — Progress Notes (Signed)
POD #1 Good pain relief, Tolerating regular diet, no flatus yet  Blood pressure 131/71, pulse 81, temperature 98.1 F (36.7 C), temperature source Oral, resp. rate 20, height 5\' 2"  (1.575 m), weight 124.286 kg (274 lb), last menstrual period 06/03/2010, SpO2 98.00%, unknown if currently breastfeeding.  I/O 4495/2035  Lungs CTA Abd soft, some BS Ext PAS on DTR 1+  Magnesium Sulfate running  Results for orders placed during the hospital encounter of 02/25/11 (from the past 24 hour(s))  CBC     Status: Normal   Collection Time   02/25/11 12:39 PM      Component Value Range   WBC 8.4  4.0 - 10.5 (K/uL)   RBC 4.26  3.87 - 5.11 (MIL/uL)   Hemoglobin 12.3  12.0 - 15.0 (g/dL)   HCT 16.1  09.6 - 04.5 (%)   MCV 86.6  78.0 - 100.0 (fL)   MCH 28.9  26.0 - 34.0 (pg)   MCHC 33.3  30.0 - 36.0 (g/dL)   RDW 40.9  81.1 - 91.4 (%)   Platelets 242  150 - 400 (K/uL)  TYPE AND SCREEN     Status: Normal   Collection Time   02/25/11 12:39 PM      Component Value Range   ABO/RH(D) O POS     Antibody Screen NEG     Sample Expiration 02/28/2011    ABO/RH     Status: Normal   Collection Time   02/25/11 12:39 PM      Component Value Range   ABO/RH(D) O POS    URINALYSIS, ROUTINE W REFLEX MICROSCOPIC     Status: Abnormal   Collection Time   02/25/11  5:00 PM      Component Value Range   Color, Urine AMBER (*) YELLOW    APPearance HAZY (*) CLEAR    Specific Gravity, Urine 1.025  1.005 - 1.030    pH 6.5  5.0 - 8.0    Glucose, UA NEGATIVE  NEGATIVE (mg/dL)   Hgb urine dipstick MODERATE (*) NEGATIVE    Bilirubin Urine NEGATIVE  NEGATIVE    Ketones, ur 40 (*) NEGATIVE (mg/dL)   Protein, ur 782 (*) NEGATIVE (mg/dL)   Urobilinogen, UA 0.2  0.0 - 1.0 (mg/dL)   Nitrite NEGATIVE  NEGATIVE    Leukocytes, UA NEGATIVE  NEGATIVE   URINE MICROSCOPIC-ADD ON     Status: Abnormal   Collection Time   02/25/11  5:00 PM      Component Value Range   Squamous Epithelial / LPF MANY (*) RARE    WBC, UA 7-10  <3  (WBC/hpf)   RBC / HPF 21-50  <3 (RBC/hpf)   Bacteria, UA MANY (*) RARE    Urine-Other MUCOUS PRESENT    CBC     Status: Abnormal   Collection Time   02/25/11  5:01 PM      Component Value Range   WBC 18.1 (*) 4.0 - 10.5 (K/uL)   RBC 3.92  3.87 - 5.11 (MIL/uL)   Hemoglobin 11.2 (*) 12.0 - 15.0 (g/dL)   HCT 95.6 (*) 21.3 - 46.0 (%)   MCV 86.5  78.0 - 100.0 (fL)   MCH 28.6  26.0 - 34.0 (pg)   MCHC 33.0  30.0 - 36.0 (g/dL)   RDW 08.6  57.8 - 46.9 (%)   Platelets 219  150 - 400 (K/uL)  COMPREHENSIVE METABOLIC PANEL     Status: Abnormal   Collection Time   02/25/11  5:01 PM  Component Value Range   Sodium 135  135 - 145 (mEq/L)   Potassium 3.7  3.5 - 5.1 (mEq/L)   Chloride 102  96 - 112 (mEq/L)   CO2 23  19 - 32 (mEq/L)   Glucose, Bld 92  70 - 99 (mg/dL)   BUN 12  6 - 23 (mg/dL)   Creatinine, Ser 1.61  0.50 - 1.10 (mg/dL)   Calcium 9.3  8.4 - 09.6 (mg/dL)   Total Protein 5.6 (*) 6.0 - 8.3 (g/dL)   Albumin 2.4 (*) 3.5 - 5.2 (g/dL)   AST 48 (*) 0 - 37 (U/L)   ALT 69 (*) 0 - 35 (U/L)   Alkaline Phosphatase 152 (*) 39 - 117 (U/L)   Total Bilirubin 0.3  0.3 - 1.2 (mg/dL)   GFR calc non Af Amer >90  >90 (mL/min)   GFR calc Af Amer >90  >90 (mL/min)  URIC ACID     Status: Normal   Collection Time   02/25/11  5:01 PM      Component Value Range   Uric Acid, Serum 6.8  2.4 - 7.0 (mg/dL)  CBC     Status: Abnormal   Collection Time   02/26/11  5:07 AM      Component Value Range   WBC 8.7  4.0 - 10.5 (K/uL)   RBC 3.54 (*) 3.87 - 5.11 (MIL/uL)   Hemoglobin 9.9 (*) 12.0 - 15.0 (g/dL)   HCT 04.5 (*) 40.9 - 46.0 (%)   MCV 87.9  78.0 - 100.0 (fL)   MCH 28.0  26.0 - 34.0 (pg)   MCHC 31.8  30.0 - 36.0 (g/dL)   RDW 81.1  91.4 - 78.2 (%)   Platelets 199  150 - 400 (K/uL)  COMPREHENSIVE METABOLIC PANEL     Status: Abnormal   Collection Time   02/26/11  5:07 AM      Component Value Range   Sodium 134 (*) 135 - 145 (mEq/L)   Potassium 4.1  3.5 - 5.1 (mEq/L)   Chloride 101  96 - 112 (mEq/L)    CO2 25  19 - 32 (mEq/L)   Glucose, Bld 76  70 - 99 (mg/dL)   BUN 9  6 - 23 (mg/dL)   Creatinine, Ser 9.56  0.50 - 1.10 (mg/dL)   Calcium 8.1 (*) 8.4 - 10.5 (mg/dL)   Total Protein 4.6 (*) 6.0 - 8.3 (g/dL)   Albumin 1.9 (*) 3.5 - 5.2 (g/dL)   AST 50 (*) 0 - 37 (U/L)   ALT 65 (*) 0 - 35 (U/L)   Alkaline Phosphatase 133 (*) 39 - 117 (U/L)   Total Bilirubin 0.3  0.3 - 1.2 (mg/dL)   GFR calc non Af Amer >90  >90 (mL/min)   GFR calc Af Amer >90  >90 (mL/min)  URIC ACID     Status: Abnormal   Collection Time   02/26/11  5:07 AM      Component Value Range   Uric Acid, Serum 7.2 (*) 2.4 - 7.0 (mg/dL)  MAGNESIUM     Status: Abnormal   Collection Time   02/26/11  5:07 AM      Component Value Range   Magnesium 4.3 (*) 1.5 - 2.5 (mg/dL)  A: PP preeclampsia P: continue magnesium sulfate     Check labs this afternoon

## 2011-02-26 NOTE — Anesthesia Postprocedure Evaluation (Signed)
  Anesthesia Post-op Note  Patient: Jessica Barron  Procedure(s) Performed:  CESAREAN SECTION  Patient Location: Antenatal  Anesthesia Type: Spinal  Level of Consciousness: awake, alert  and oriented  Airway and Oxygen Therapy: Patient Spontanous Breathing  Post-op Pain: mild  Post-op Assessment: Patient's Cardiovascular Status Stable, Respiratory Function Stable, Patent Airway, No signs of Nausea or vomiting and Pain level controlled  Post-op Vital Signs: stable  Complications: No apparent anesthesia complications

## 2011-02-26 NOTE — Addendum Note (Signed)
Addendum  created 02/26/11 1322 by Lincoln Brigham, CRNA   Modules edited:Notes Section

## 2011-02-26 NOTE — Progress Notes (Signed)
Ambulated in room to doorway and back to chair, encouragement given. Husband and infant at bedside.

## 2011-02-26 NOTE — Progress Notes (Signed)
No HA  Blood pressure 144/80, pulse 93, temperature 98.2 F (36.8 C), temperature source Oral, resp. rate 20, height 5\' 2"  (1.575 m), weight 124.286 kg (274 lb), last menstrual period 06/03/2010, SpO2 97.00%, unknown if currently breastfeeding.  Excellent UO  Lungs CTA  Results for orders placed during the hospital encounter of 02/25/11 (from the past 24 hour(s))  CBC     Status: Abnormal   Collection Time   02/26/11  5:07 AM      Component Value Range   WBC 8.7  4.0 - 10.5 (K/uL)   RBC 3.54 (*) 3.87 - 5.11 (MIL/uL)   Hemoglobin 9.9 (*) 12.0 - 15.0 (g/dL)   HCT 72.5 (*) 36.6 - 46.0 (%)   MCV 87.9  78.0 - 100.0 (fL)   MCH 28.0  26.0 - 34.0 (pg)   MCHC 31.8  30.0 - 36.0 (g/dL)   RDW 44.0  34.7 - 42.5 (%)   Platelets 199  150 - 400 (K/uL)  COMPREHENSIVE METABOLIC PANEL     Status: Abnormal   Collection Time   02/26/11  5:07 AM      Component Value Range   Sodium 134 (*) 135 - 145 (mEq/L)   Potassium 4.1  3.5 - 5.1 (mEq/L)   Chloride 101  96 - 112 (mEq/L)   CO2 25  19 - 32 (mEq/L)   Glucose, Bld 76  70 - 99 (mg/dL)   BUN 9  6 - 23 (mg/dL)   Creatinine, Ser 9.56  0.50 - 1.10 (mg/dL)   Calcium 8.1 (*) 8.4 - 10.5 (mg/dL)   Total Protein 4.6 (*) 6.0 - 8.3 (g/dL)   Albumin 1.9 (*) 3.5 - 5.2 (g/dL)   AST 50 (*) 0 - 37 (U/L)   ALT 65 (*) 0 - 35 (U/L)   Alkaline Phosphatase 133 (*) 39 - 117 (U/L)   Total Bilirubin 0.3  0.3 - 1.2 (mg/dL)   GFR calc non Af Amer >90  >90 (mL/min)   GFR calc Af Amer >90  >90 (mL/min)  URIC ACID     Status: Abnormal   Collection Time   02/26/11  5:07 AM      Component Value Range   Uric Acid, Serum 7.2 (*) 2.4 - 7.0 (mg/dL)  MAGNESIUM     Status: Abnormal   Collection Time   02/26/11  5:07 AM      Component Value Range   Magnesium 4.3 (*) 1.5 - 2.5 (mg/dL)  CBC     Status: Abnormal   Collection Time   02/26/11  3:00 PM      Component Value Range   WBC 9.0  4.0 - 10.5 (K/uL)   RBC 3.39 (*) 3.87 - 5.11 (MIL/uL)   Hemoglobin 9.6 (*) 12.0 - 15.0 (g/dL)   HCT 38.7 (*) 56.4 - 46.0 (%)   MCV 87.9  78.0 - 100.0 (fL)   MCH 28.3  26.0 - 34.0 (pg)   MCHC 32.2  30.0 - 36.0 (g/dL)   RDW 33.2  95.1 - 88.4 (%)   Platelets 217  150 - 400 (K/uL)  COMPREHENSIVE METABOLIC PANEL     Status: Abnormal   Collection Time   02/26/11  3:00 PM      Component Value Range   Sodium 131 (*) 135 - 145 (mEq/L)   Potassium 4.0  3.5 - 5.1 (mEq/L)   Chloride 98  96 - 112 (mEq/L)   CO2 26  19 - 32 (mEq/L)   Glucose, Bld 88  70 -  99 (mg/dL)   BUN 8  6 - 23 (mg/dL)   Creatinine, Ser 1.61  0.50 - 1.10 (mg/dL)   Calcium 7.7 (*) 8.4 - 10.5 (mg/dL)   Total Protein 4.7 (*) 6.0 - 8.3 (g/dL)   Albumin 1.9 (*) 3.5 - 5.2 (g/dL)   AST 43 (*) 0 - 37 (U/L)   ALT 62 (*) 0 - 35 (U/L)   Alkaline Phosphatase 130 (*) 39 - 117 (U/L)   Total Bilirubin 0.3  0.3 - 1.2 (mg/dL)   GFR calc non Af Amer >90  >90 (mL/min)   GFR calc Af Amer >90  >90 (mL/min)  URIC ACID     Status: Normal   Collection Time   02/26/11  3:00 PM      Component Value Range   Uric Acid, Serum 7.0  2.4 - 7.0 (mg/dL)  A: preeclampsia resolving P: D/C magnesium at 7 pm     Check labs in am

## 2011-02-27 LAB — COMPREHENSIVE METABOLIC PANEL
ALT: 49 U/L — ABNORMAL HIGH (ref 0–35)
AST: 31 U/L (ref 0–37)
Alkaline Phosphatase: 117 U/L (ref 39–117)
CO2: 25 mEq/L (ref 19–32)
Calcium: 8 mg/dL — ABNORMAL LOW (ref 8.4–10.5)
Chloride: 106 mEq/L (ref 96–112)
GFR calc Af Amer: >90 mL/min (ref >90)
GFR calc non Af Amer: >90 mL/min (ref >90)
Glucose, Bld: 73 mg/dL (ref 70–99)
Potassium: 3.9 mEq/L (ref 3.5–5.1)
Sodium: 138 mEq/L (ref 135–145)
Total Bilirubin: 0.2 mg/dL — ABNORMAL LOW (ref 0.3–1.2)

## 2011-02-27 LAB — CBC
HCT: 29.4 % — ABNORMAL LOW (ref 36.0–46.0)
Hemoglobin: 9.3 g/dL — ABNORMAL LOW (ref 12.0–15.0)
MCH: 28.1 pg (ref 26.0–34.0)
MCHC: 31.6 g/dL (ref 30.0–36.0)
MCV: 88.8 fL (ref 78.0–100.0)

## 2011-02-27 MED ORDER — LABETALOL HCL 100 MG PO TABS
100.0000 mg | ORAL_TABLET | Freq: Two times a day (BID) | ORAL | Status: DC
  Administered 2011-02-27: 100 mg via ORAL
  Filled 2011-02-27 (×2): qty 1

## 2011-02-27 NOTE — Progress Notes (Signed)
Good pain control Tolerating regular diet, voiding, ambulating No flatus yet Baby in NICU for hypoglycemia  Blood pressure 155/92, pulse 93, temperature 98.8 F (37.1 C), temperature source Oral, resp. rate 20, height 5\' 2"  (1.575 m), weight 124.286 kg (274 lb), last menstrual period 06/03/2010, SpO2 97.00%, unknown if currently breastfeeding.  Abd soft, good BS     Dressing C&D  Results for orders placed during the hospital encounter of 02/25/11 (from the past 24 hour(s))  CBC     Status: Abnormal   Collection Time   02/26/11  3:00 PM      Component Value Range   WBC 9.0  4.0 - 10.5 (K/uL)   RBC 3.39 (*) 3.87 - 5.11 (MIL/uL)   Hemoglobin 9.6 (*) 12.0 - 15.0 (g/dL)   HCT 40.9 (*) 81.1 - 46.0 (%)   MCV 87.9  78.0 - 100.0 (fL)   MCH 28.3  26.0 - 34.0 (pg)   MCHC 32.2  30.0 - 36.0 (g/dL)   RDW 91.4  78.2 - 95.6 (%)   Platelets 217  150 - 400 (K/uL)  COMPREHENSIVE METABOLIC PANEL     Status: Abnormal   Collection Time   02/26/11  3:00 PM      Component Value Range   Sodium 131 (*) 135 - 145 (mEq/L)   Potassium 4.0  3.5 - 5.1 (mEq/L)   Chloride 98  96 - 112 (mEq/L)   CO2 26  19 - 32 (mEq/L)   Glucose, Bld 88  70 - 99 (mg/dL)   BUN 8  6 - 23 (mg/dL)   Creatinine, Ser 2.13  0.50 - 1.10 (mg/dL)   Calcium 7.7 (*) 8.4 - 10.5 (mg/dL)   Total Protein 4.7 (*) 6.0 - 8.3 (g/dL)   Albumin 1.9 (*) 3.5 - 5.2 (g/dL)   AST 43 (*) 0 - 37 (U/L)   ALT 62 (*) 0 - 35 (U/L)   Alkaline Phosphatase 130 (*) 39 - 117 (U/L)   Total Bilirubin 0.3  0.3 - 1.2 (mg/dL)   GFR calc non Af Amer >90  >90 (mL/min)   GFR calc Af Amer >90  >90 (mL/min)  URIC ACID     Status: Normal   Collection Time   02/26/11  3:00 PM      Component Value Range   Uric Acid, Serum 7.0  2.4 - 7.0 (mg/dL)  CBC     Status: Abnormal   Collection Time   02/27/11  5:22 AM      Component Value Range   WBC 9.8  4.0 - 10.5 (K/uL)   RBC 3.31 (*) 3.87 - 5.11 (MIL/uL)   Hemoglobin 9.3 (*) 12.0 - 15.0 (g/dL)   HCT 08.6 (*) 57.8 - 46.0 (%)     MCV 88.8  78.0 - 100.0 (fL)   MCH 28.1  26.0 - 34.0 (pg)   MCHC 31.6  30.0 - 36.0 (g/dL)   RDW 46.9  62.9 - 52.8 (%)   Platelets 222  150 - 400 (K/uL)  COMPREHENSIVE METABOLIC PANEL     Status: Abnormal   Collection Time   02/27/11  5:22 AM      Component Value Range   Sodium 138  135 - 145 (mEq/L)   Potassium 3.9  3.5 - 5.1 (mEq/L)   Chloride 106  96 - 112 (mEq/L)   CO2 25  19 - 32 (mEq/L)   Glucose, Bld 73  70 - 99 (mg/dL)   BUN 10  6 - 23 (mg/dL)   Creatinine,  Ser 0.82  0.50 - 1.10 (mg/dL)   Calcium 8.0 (*) 8.4 - 10.5 (mg/dL)   Total Protein 4.6 (*) 6.0 - 8.3 (g/dL)   Albumin 1.9 (*) 3.5 - 5.2 (g/dL)   AST 31  0 - 37 (U/L)   ALT 49 (*) 0 - 35 (U/L)   Alkaline Phosphatase 117  39 - 117 (U/L)   Total Bilirubin 0.2 (*) 0.3 - 1.2 (mg/dL)   GFR calc non Af Amer >90  >90 (mL/min)   GFR calc Af Amer >90  >90 (mL/min)  A: Preeclampsia resolving P: check LFT in am

## 2011-02-27 NOTE — Progress Notes (Signed)
Dr Henderson Cloud notified of pt elevated bp readings at 0524 and 0600; states to get repeat blood pressure at 0630 and call results  Jessica Barron

## 2011-02-27 NOTE — Progress Notes (Signed)
Patients infant transferred to NICU due to continued hypoglycemia Kimber Relic

## 2011-02-27 NOTE — Progress Notes (Signed)
Call from nurse  BP 165/102, repeat 153/92  No CNS change, no epigastric pain  Will begin labetolol 100mg , po bid

## 2011-02-27 NOTE — Progress Notes (Signed)
Dr Henderson Cloud notified of pt 0630 blood pressure results and pt denies c/o headache/abdominal pain; states vision is same as has been since admission. Jessica Barron

## 2011-02-28 ENCOUNTER — Encounter (HOSPITAL_COMMUNITY): Payer: Self-pay | Admitting: Obstetrics and Gynecology

## 2011-02-28 LAB — HEPATIC FUNCTION PANEL
Albumin: 2.1 g/dL — ABNORMAL LOW (ref 3.5–5.2)
Alkaline Phosphatase: 108 U/L (ref 39–117)
Bilirubin, Direct: <0.1 mg/dL (ref 0.0–0.3)
Total Bilirubin: 0.2 mg/dL — ABNORMAL LOW (ref 0.3–1.2)

## 2011-02-28 MED ORDER — LABETALOL HCL 200 MG PO TABS: 200.0000 mg | ORAL_TABLET | Freq: Two times a day (BID) | ORAL | Status: DC

## 2011-02-28 MED ORDER — LABETALOL HCL 200 MG PO TABS
200.0000 mg | ORAL_TABLET | Freq: Two times a day (BID) | ORAL | Status: DC
  Administered 2011-02-28 – 2011-03-01 (×3): 200 mg via ORAL
  Filled 2011-02-28 (×5): qty 1

## 2011-02-28 NOTE — Progress Notes (Signed)
Agree w/ abve.  Labetalol increased to 200mg  bid.

## 2011-02-28 NOTE — Progress Notes (Signed)
Subjective: Postpartum Day 3: Cesarean Delivery Patient reports tolerating PO, + flatus and no problems voiding.  Denies Ha, reports jerking intermittantly, thinks related to falling asleep. Denies ROQ pain, less pedal edema  Objective: Vital signs in last 24 hours: Temp:  [97.4 F (36.3 C)-98.3 F (36.8 C)] 97.4 F (36.3 C) (02/04 0500) Pulse Rate:  [78-98] 82  (02/04 0500) Resp:  [18-20] 18  (02/04 0500) BP: (138-178)/(86-102) 178/92 mmHg (02/04 0500) SpO2:  [97 %] 97 % (02/03 1004)  Physical Exam:  General: alert and cooperative Lochia: appropriate Uterine Fundus: firm Incision: healing well DVT Evaluation: No evidence of DVT seen on physical exam. DTR'S 1+ no clonus, no significant pedal edema   Basename 02/27/11 0522 02/26/11 1500  HGB 9.3* 9.6*  HCT 29.4* 29.8*    Assessment/Plan: Status post Cesarean section. Postoperative course complicated by pp [facility]  Continue current care.  Jayshawn Colston G 02/28/2011, 8:12 AM

## 2011-03-01 MED ORDER — LABETALOL HCL 200 MG PO TABS: 200.0000 mg | ORAL_TABLET | Freq: Two times a day (BID) | ORAL | Status: DC

## 2011-03-01 MED ORDER — IBUPROFEN 600 MG PO TABS: 600.0000 mg | ORAL_TABLET | Freq: Four times a day (QID) | ORAL | Status: AC

## 2011-03-01 MED ORDER — OXYCODONE-ACETAMINOPHEN 5-325 MG PO TABS: 1.0000 | ORAL_TABLET | ORAL | Status: AC | PRN

## 2011-03-01 NOTE — Progress Notes (Signed)
Subjective: Postpartum Day 4: Cesarean Delivery Patient reports incisional pain, tolerating PO, + BM and no problems voiding. Continues to have burning on R margin of the incisional site. No evidence of ecchymosis, erythema, or seroma.. Denies HA, blurred vision  Objective: Vital signs in last 24 hours: Temp:  [97.9 F (36.6 C)-98.3 F (36.8 C)] 97.9 F (36.6 C) (02/05 0549) Pulse Rate:  [90-96] 90  (02/05 0549) Resp:  [20] 20  (02/05 0549) BP: (139-159)/(82-95) 139/90 mmHg (02/05 0549) SpO2:  [96 %] 96 % (02/04 1400)  Physical Exam:  General: alert and cooperative Lochia: appropriate Uterine Fundus: firm Incision: healing well, staples removed and steristrips applied DVT Evaluation: No evidence of DVT seen on physical exam.   Basename 02/27/11 0522 02/26/11 1500  HGB 9.3* 9.6*  HCT 29.4* 29.8*    Assessment/Plan: Status post Cesarean section. Postoperative course complicated by pih  Discharge home with standard precautions and return to clinic in 2-3 days for bp check  CURTIS,CAROL G 03/01/2011, 9:01 AM

## 2011-03-01 NOTE — Discharge Summary (Signed)
Obstetric Discharge Summary Reason for Admission: onset of labor Prenatal Procedures: ultrasound Intrapartum Procedures: cesarean: low cervical, transverse Postpartum Procedures: none Complications-Operative and Postpartum: PIH Hemoglobin  Date Value Range Status  02/27/2011 9.3* 12.0-15.0 (Barron/dL) Final     HCT  Date Value Range Status  02/27/2011 29.4* 36.0-46.0 (%) Final    Discharge Diagnoses: Term Pregnancy-delivered  Discharge Information: Date: 03/01/2011 Activity: pelvic rest Diet: routine Medications: PNV, Ibuprofen, Percocet and labetalol Condition: stable Instructions: refer to practice specific booklet Discharge to: home   Newborn Data: Live born female  Birth Weight: 9 lb 8.6 oz (4325 Barron) APGAR: 9, 9  Home with mother.  Jessica Barron,Jessica Barron 03/01/2011, 9:22 AM

## 2011-03-03 ENCOUNTER — Encounter (HOSPITAL_COMMUNITY): Admission: RE | Payer: Self-pay | Source: Ambulatory Visit

## 2011-03-03 ENCOUNTER — Inpatient Hospital Stay (HOSPITAL_COMMUNITY): Admission: RE | Admit: 2011-03-03 | Payer: 59 | Source: Ambulatory Visit | Admitting: Obstetrics and Gynecology

## 2011-03-03 SURGERY — Surgical Case
Anesthesia: Regional

## 2013-03-19 ENCOUNTER — Emergency Department (HOSPITAL_COMMUNITY)
Admission: EM | Admit: 2013-03-19 | Discharge: 2013-03-20 | Disposition: A | Discharge status: Home / Self Care | Payer: BC Managed Care – PPO | Attending: Emergency Medicine | Admitting: Emergency Medicine

## 2013-03-19 ENCOUNTER — Encounter (HOSPITAL_COMMUNITY): Payer: Self-pay | Admitting: Emergency Medicine

## 2013-03-19 ENCOUNTER — Emergency Department (HOSPITAL_COMMUNITY): Payer: BC Managed Care – PPO

## 2013-03-19 DIAGNOSIS — W010XXA Fall on same level from slipping, tripping and stumbling without subsequent striking against object, initial encounter: Secondary | ICD-10-CM | POA: Insufficient documentation

## 2013-03-19 DIAGNOSIS — Y939 Activity, unspecified: Secondary | ICD-10-CM | POA: Insufficient documentation

## 2013-03-19 DIAGNOSIS — Y929 Unspecified place or not applicable: Secondary | ICD-10-CM | POA: Insufficient documentation

## 2013-03-19 DIAGNOSIS — S9000XA Contusion of unspecified ankle, initial encounter: Secondary | ICD-10-CM | POA: Insufficient documentation

## 2013-03-19 DIAGNOSIS — S93409A Sprain of unspecified ligament of unspecified ankle, initial encounter: Secondary | ICD-10-CM | POA: Insufficient documentation

## 2013-03-19 DIAGNOSIS — S93401A Sprain of unspecified ligament of right ankle, initial encounter: Secondary | ICD-10-CM

## 2013-03-19 DIAGNOSIS — Z79899 Other long term (current) drug therapy: Secondary | ICD-10-CM | POA: Insufficient documentation

## 2013-03-19 DIAGNOSIS — X500XXA Overexertion from strenuous movement or load, initial encounter: Secondary | ICD-10-CM | POA: Insufficient documentation

## 2013-03-19 DIAGNOSIS — Z8742 Personal history of other diseases of the female genital tract: Secondary | ICD-10-CM | POA: Insufficient documentation

## 2013-03-19 MED ORDER — IBUPROFEN 600 MG PO TABS: 600.0000 mg | ORAL_TABLET | Freq: Four times a day (QID) | ORAL | Status: DC | PRN

## 2013-03-19 MED ORDER — IBUPROFEN 400 MG PO TABS
600.0000 mg | ORAL_TABLET | Freq: Once | ORAL | Status: AC
  Administered 2013-03-19: 600 mg via ORAL
  Filled 2013-03-19: qty 2

## 2013-03-19 NOTE — ED Provider Notes (Signed)
CSN: [id number]     Arrival date & time 03/19/13  2238 History   First MD Initiated Contact with Patient 03/19/13 2241     Chief Complaint  Patient presents with  . Ankle Injury     (Consider location/radiation/quality/duration/timing/severity/associated sxs/prior Treatment) HPI Comments: Jessica Barron is a 33 y.o. Female presenting with right ankle pain which occurred suddenly when the patient tripped and fell, landing on her inverted right ankle just prior to arrival.   Pain is aching, constant and worse with palpation, movement and weight bearing.  The patient was able to weight bear immediately after the event.  There is no radiation of pain and the patient denies numbness distal to the injury site.  She has had no medicines or other treatment prior to arrival.     The history is provided by the patient.    Past Medical History  Diagnosis Date  . Abnormal Pap smear     repeat WNL  . History of PCOS   . Breech presentation current preg edc 03/10/11   Past Surgical History  Procedure Laterality Date  . Cholecystectomy    . Cesarean section  02/25/2011    Procedure: CESAREAN SECTION;  Surgeon: Leslie AndreaJames E Tomblin II, MD;  Location: [facility];  Service: Gynecology;  Laterality: N/A;   Family History  Problem Relation Age of Onset  . Anesthesia problems Neg Hx    History  Substance Use Topics  . Smoking status: Never Smoker   . Smokeless tobacco: Not on file  . Alcohol Use: No   OB History   Grav Para Term Preterm Abortions TAB SAB Ect Mult Living   1 1 1  0 0 0 0 0 0 1     Review of Systems  Musculoskeletal: Positive for arthralgias and joint swelling.  Skin: Negative for wound.  Neurological: Negative for weakness and numbness.      Allergies  Review of patient's allergies indicates no known allergies.  Home Medications   Current Outpatient Rx  Name  Route  Sig  Dispense  Refill  . Docosahexaenoic Acid (DHA OMEGA 3 PO)   Oral   Take 1 capsule by mouth daily.          Marland Kitchen. ibuprofen (ADVIL,MOTRIN) 600 MG tablet   Oral   Take 1 tablet (600 mg total) by mouth every 6 (six) hours as needed.   30 tablet   0   . EXPIRED: labetalol (NORMODYNE) 200 MG tablet   Oral   Take 1 tablet (200 mg total) by mouth 2 (two) times daily.   60 tablet   1   . Prenatal Vit-Fe Fumarate-FA (PRENATAL MULTIVITAMIN) TABS   Oral   Take 1 tablet by mouth daily.         . traMADol (ULTRAM) 50 MG tablet   Oral   Take 1 tablet (50 mg total) by mouth every 6 (six) hours as needed.   20 tablet   0    BP 135/61  Pulse 107  Temp(Src) 98.7 F (37.1 C) (Oral)  Resp 18  SpO2 100%  LMP 03/02/2013  Breastfeeding? No Physical Exam  Nursing note and vitals reviewed. Constitutional: She appears well-developed and well-nourished.  HENT:  Head: Normocephalic.  Cardiovascular: Normal rate and intact distal pulses.  Exam reveals no decreased pulses.   Pulses:      Dorsalis pedis pulses are 2+ on the right side, and 2+ on the left side.       Posterior tibial pulses are 2+  on the right side, and 2+ on the left side.  Musculoskeletal: She exhibits edema and tenderness.       Right ankle: She exhibits decreased range of motion, swelling and ecchymosis. She exhibits normal pulse. Tenderness. Lateral malleolus tenderness found. No head of 5th metatarsal and no proximal fibula tenderness found. Achilles tendon normal.  Neurological: She is alert. No sensory deficit.  Skin: Skin is warm, dry and intact.    ED Course  Procedures (including critical care time) Labs Review Labs Reviewed - No data to display Imaging Review Dg Ankle Complete Right  03/19/2013   CLINICAL DATA:  Right ankle injury and pain with swelling.  EXAM: RIGHT ANKLE - COMPLETE 3+ VIEW  COMPARISON:  None.  FINDINGS: A possible small bony fragment off of the lateral talus noted.  No other fracture, subluxation or dislocation noted.  Lateral soft tissue swelling is identified.  The ankle mortise is intact.   IMPRESSION: Possible tiny fracture of the lateral talus.   Electronically Signed   By: Laveda Abbe M.D.   On: 03/19/2013 23:31    EKG Interpretation   None       MDM   Final diagnoses:  Right ankle sprain    Patients labs and/or radiological studies were viewed and considered during the medical decision making and disposition process. Pt was placed in aso,  Crutches given.  Advised RICE,  Ibuprofen,  Tramadol prescribed.  Referral to ortho for recheck this week.    Burgess Amor, PA-C 03/20/13 0022

## 2013-03-19 NOTE — ED Notes (Signed)
Pt c/o right ankle pain. Pt states "I fell down on my ankle and I felt popping".

## 2013-03-19 NOTE — Discharge Instructions (Signed)

## 2013-03-20 MED ORDER — TRAMADOL HCL 50 MG PO TABS: 50.0000 mg | ORAL_TABLET | Freq: Four times a day (QID) | ORAL | Status: DC | PRN

## 2013-03-20 NOTE — ED Provider Notes (Signed)
Medical screening examination/treatment/procedure(s) were performed by non-physician practitioner and as supervising physician I was immediately available for consultation/collaboration.  Dione Boozeavid Anoushka Divito, MD 03/20/13 [phone]

## 2013-03-26 ENCOUNTER — Ambulatory Visit (INDEPENDENT_AMBULATORY_CARE_PROVIDER_SITE_OTHER): Payer: BC Managed Care – PPO | Admitting: Orthopedic Surgery

## 2013-03-26 VITALS — Ht 62.0 in | Wt 253.0 lb

## 2013-03-26 DIAGNOSIS — S93409A Sprain of unspecified ligament of unspecified ankle, initial encounter: Secondary | ICD-10-CM

## 2013-03-26 MED ORDER — TRAMADOL HCL 50 MG PO TABS: 50.0000 mg | ORAL_TABLET | Freq: Four times a day (QID) | ORAL | Status: DC | PRN

## 2013-03-26 MED ORDER — IBUPROFEN 600 MG PO TABS: 600.0000 mg | ORAL_TABLET | Freq: Four times a day (QID) | ORAL | Status: DC | PRN

## 2013-03-26 NOTE — Patient Instructions (Signed)
WBAT PROGRESSIVELY LOSE CRUTCHES

## 2013-03-27 ENCOUNTER — Encounter: Payer: Self-pay | Admitting: Orthopedic Surgery

## 2013-03-27 DIAGNOSIS — S93409A Sprain of unspecified ligament of unspecified ankle, initial encounter: Secondary | ICD-10-CM | POA: Insufficient documentation

## 2013-03-27 NOTE — Progress Notes (Signed)
Patient ID: Jessica OpitzRebecca Jakubiak, female   DOB: 11/29/1980, 33 y.o.   MRN: [id number]  Chief Complaint  Patient presents with  . Ankle Pain     ER Follow up Right ankle Sprain Possible tiny fracture of the lateral talus. Date of injury February 24    33 year old female stepped off of a small step rolled her right ankle heard a loud pop had immediate pain swelling and inability to weight-bear she was seen in the emergency room for x-rays. X-rays were negative for fracture. There is questionable tiny avulsion from the lateral process of the talus Ankle joint itself intact  She was placed in an ASO brace given crutches. She complains of severe pain and still can't weight-bear now that she is approximately a week out Review of systems is recorded in the scan documents, reviewed and signed The past, family history and social history have been reviewed and are recorded in the corresponding sections of epic   Vital signs:  Ht 5\' 2"  (1.575 m)  Wt 253 lb (114.76 kg)  BMI 46.26 kg/m2  LMP 03/02/2013  General the patient is well-developed and well-nourished grooming and hygiene are normal Oriented x3 Mood and affect normal Ambulation normal  Inspection of the right ankle severe pain swelling medial side nontender decreased range of motion especially dorsiflexion tour test inconclusive eversion strength weak secondary to pain retest on next visit Skin clean dry and intact  Cardiovascular exam is normal Sensory exam normal  X-ray show no fracture  Recommend Cam Walker weightbearing as tolerated with crutches, progressive weaning of the crutches as tolerated. Ankle exercises daily to prevent Achilles contracture  Followup 4-6 weeks

## 2013-03-28 ENCOUNTER — Ambulatory Visit: Payer: 59 | Admitting: Orthopedic Surgery

## 2013-04-01 LAB — HM PAP SMEAR

## 2013-05-07 ENCOUNTER — Ambulatory Visit: Payer: BC Managed Care – PPO | Admitting: Orthopedic Surgery

## 2013-05-27 ENCOUNTER — Other Ambulatory Visit: Payer: Self-pay | Admitting: Gynecology

## 2013-05-28 ENCOUNTER — Ambulatory Visit (INDEPENDENT_AMBULATORY_CARE_PROVIDER_SITE_OTHER): Payer: BC Managed Care – PPO | Admitting: Orthopedic Surgery

## 2013-05-28 ENCOUNTER — Encounter: Payer: Self-pay | Admitting: Orthopedic Surgery

## 2013-05-28 VITALS — BP 132/96 | Ht 62.0 in | Wt 256.0 lb

## 2013-05-28 DIAGNOSIS — S93409A Sprain of unspecified ligament of unspecified ankle, initial encounter: Secondary | ICD-10-CM

## 2013-05-28 DIAGNOSIS — M25579 Pain in unspecified ankle and joints of unspecified foot: Secondary | ICD-10-CM

## 2013-05-28 NOTE — Patient Instructions (Addendum)
You have received a steroid shot. 15% of patients experience increased pain at the injection site with in the next 24 hours. This is best treated with ice and tylenol extra strength 2 tabs every 8 hours. If you are still having pain please call the office.   Use ice as needed for swelling

## 2013-05-28 NOTE — Progress Notes (Signed)
Patient ID: Jessica OpitzRebecca Barron, female   DOB: 02/24/1980, 33 y.o.   MRN: [id number]  Chief Complaint  Patient presents with  . Follow-up    6 week recheck right ankle sprain DOI 03/19/13    HISTORY: 33 year old female had grade 2 ankle sprain back in February of this year still not resolved after almost 3 months. We placed her in a Cam Walker. She took medication. She did not have any therapy to this point. She's gradually wean himself out of the Cam Walker but if she walks on it or stays on it too long she has increased pain and swelling along the lateral gutter.  Review of systems negative  Exam this tenderness over the lateral gutter ankle drawer test is normal Achilles tendon is intact ankle range of motion equals the left side. She may have some mild weakness in the uterus. Her gait pattern is sluggish. Vital signs are stable. General appearance is normal, the patient is alert and oriented x3 with normal mood and affect. BP 132/96  Ht 5\' 2"  (1.575 m)  Wt 256 lb (116.121 kg)  BMI 46.81 kg/m2   Encounter Diagnoses  Name Primary?  . Sprain of ankle, unspecified site Yes  . 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 Time out confirmation. The point of maximum tenderness was identified and marked.  After a sterile alcohol prep, the site was anesthetized.  A 25 gauge needle was advanced into the area without difficulty.  The area was then injected with 1 ml of depomedrol. The area was cleansed with isopropyl alcohol and a dressing was applied.    Complications:  None; patient tolerated the procedure well.

## 2013-09-10 ENCOUNTER — Ambulatory Visit (INDEPENDENT_AMBULATORY_CARE_PROVIDER_SITE_OTHER): Payer: BC Managed Care – PPO | Admitting: Family Medicine

## 2013-09-10 ENCOUNTER — Encounter: Payer: Self-pay | Admitting: Family Medicine

## 2013-09-10 ENCOUNTER — Encounter (INDEPENDENT_AMBULATORY_CARE_PROVIDER_SITE_OTHER): Payer: Self-pay

## 2013-09-10 VITALS — BP 142/102 | HR 84 | Temp 98.2°F | Ht 62.0 in | Wt 256.8 lb

## 2013-09-10 DIAGNOSIS — Z6379 Other stressful life events affecting family and household: Secondary | ICD-10-CM

## 2013-09-10 DIAGNOSIS — E282 Polycystic ovarian syndrome: Secondary | ICD-10-CM

## 2013-09-10 NOTE — Progress Notes (Signed)
Pre visit review using our clinic review tool, if applicable. No additional management support is needed unless otherwise documented below in the visit note.  New patient.    Family member illness. Father with bladder cancer, recurrent.  Just had the f/u with ONC at [facility] and was told about recent developments. She is tearful episodically about this, worried.  dw pt.   H/o PCOS.  On OCPS per Gyn.  H/o obesity.  Now with regular menses.  Declined IUD prev.  Requesting records.    PMH and SH reviewed  ROS: See HPI, otherwise noncontributory.  Meds, vitals, and allergies reviewed.   GEN: nad, alert and oriented, obese, acne noted on face, hyperpigmented  Skin tag noted on L axilla HEENT: mucous membranes moist NECK: supple w/o LA CV: rrr.  PULM: ctab, no inc wob ABD: soft, +bs EXT: no edema SKIN: no acute rash

## 2013-09-10 NOTE — Patient Instructions (Signed)
I would get back on the multivitamin, then start the probiotic.  See about getting a list of counselors and I'll look at it.  We'll request your records.  Take care.  Glad to see you.

## 2013-09-11 ENCOUNTER — Encounter: Payer: Self-pay | Admitting: Family Medicine

## 2013-09-11 DIAGNOSIS — E282 Polycystic ovarian syndrome: Secondary | ICD-10-CM

## 2013-09-11 DIAGNOSIS — Z6379 Other stressful life events affecting family and household: Secondary | ICD-10-CM | POA: Insufficient documentation

## 2013-09-11 HISTORY — DX: Polycystic ovarian syndrome: E28.2

## 2013-09-11 NOTE — Assessment & Plan Note (Signed)
She'll check on covered counseling clinics.  Okay for outpatient f/u.

## 2013-09-11 NOTE — Assessment & Plan Note (Signed)
Requesting records.  D/w pt about gradual weight loss, continue OCPs for now.  >25 minutes spent in face to face time with patient, >50% spent in counselling or coordination of care.

## 2013-09-13 ENCOUNTER — Encounter: Payer: Self-pay | Admitting: Family Medicine

## 2013-09-15 ENCOUNTER — Encounter: Payer: Self-pay | Admitting: Family Medicine

## 2013-09-24 ENCOUNTER — Encounter: Payer: Self-pay | Admitting: Family Medicine

## 2013-10-08 ENCOUNTER — Encounter: Payer: Self-pay | Admitting: Family Medicine

## 2013-10-20 ENCOUNTER — Encounter: Payer: Self-pay | Admitting: Family Medicine

## 2013-10-20 DIAGNOSIS — E559 Vitamin D deficiency, unspecified: Secondary | ICD-10-CM

## 2013-11-25 ENCOUNTER — Encounter: Payer: Self-pay | Admitting: Family Medicine

## 2013-12-12 ENCOUNTER — Encounter: Payer: Self-pay | Admitting: Family Medicine

## 2013-12-13 ENCOUNTER — Encounter: Payer: Self-pay | Admitting: Family Medicine

## 2014-01-20 ENCOUNTER — Encounter: Payer: Self-pay | Admitting: Family Medicine

## 2014-02-11 ENCOUNTER — Encounter: Payer: Self-pay | Admitting: Family Medicine

## 2014-02-11 ENCOUNTER — Telehealth: Payer: Self-pay | Admitting: Family Medicine

## 2014-02-11 NOTE — Telephone Encounter (Signed)
Call pt. visit, sooner than she scheduled.  Thanks.

## 2014-02-11 NOTE — Telephone Encounter (Signed)
Appt re-scheduled 02/13/14 @ 4pm

## 2014-02-11 NOTE — Telephone Encounter (Signed)
LVM for pt to call back and ask for me to get scheduled at an earlier time as well as make 30 min per Dr. Para Marchuncan.  Also sent pt MyChart email.

## 2014-02-12 NOTE — Telephone Encounter (Signed)
Thanks

## 2014-02-13 ENCOUNTER — Encounter: Payer: Self-pay | Admitting: Family Medicine

## 2014-02-13 ENCOUNTER — Ambulatory Visit (INDEPENDENT_AMBULATORY_CARE_PROVIDER_SITE_OTHER): Payer: BLUE CROSS/BLUE SHIELD | Admitting: Family Medicine

## 2014-02-13 VITALS — BP 142/80 | HR 105 | Temp 98.6°F | Wt 257.0 lb

## 2014-02-13 DIAGNOSIS — F4321 Adjustment disorder with depressed mood: Secondary | ICD-10-CM

## 2014-02-13 MED ORDER — BUPROPION HCL ER (SR) 150 MG PO TB12: ORAL_TABLET | ORAL | Status: DC

## 2014-02-13 NOTE — Patient Instructions (Signed)
I would try the wellbutrin once a day for a few days then increase to twice a day.  Please update me in a few days.   Take care.  Glad to see you.

## 2014-02-13 NOTE — Progress Notes (Signed)
Pre visit review using our clinic review tool, if applicable. No additional management support is needed unless otherwise documented below in the visit note.  Grief.  Father died from cancer.  "No good days anymore."  "I cannot stop thinking about him."  No SI/HI.  She has things to look forward to (her kids), but she is clearly depressed.  She feels empty, meaningless.   Concentration changes- yes Slowed executive functions- yes Sleep changes- yes, insomnia, not getting to sleep until 3AM Tearful- yes Low mood- yes Anhedonia- yes Father would have had a birthday recently.   Was prev on lexapro in the past w/o over effect- she was blunted to the point of having less concern and accountability She has been more depressed than anxious.  Meds, vitals, and allergies reviewed.   ROS: See HPI.  Otherwise, noncontributory.  nad but flat affect ncat Speech wnl, judgement wnl No tremor

## 2014-02-14 ENCOUNTER — Encounter: Payer: Self-pay | Admitting: Family Medicine

## 2014-02-15 DIAGNOSIS — F4321 Adjustment disorder with depressed mood: Secondary | ICD-10-CM | POA: Insufficient documentation

## 2014-02-15 NOTE — Assessment & Plan Note (Signed)
Vs adjustment vs depression vs adjustment reaction.  Either way, she has not SI/HI and is still okay for outpatient f/u.  She has noted dec in day to day function and would need treatment.  She'll continue with journaling, consider counceling and start wellbutrin.  Routine cautions given.  No contraindication.  Okay to stat with 1 a day for 4 days, then inc to BID dosing.  She'll update me.  She agrees with plan. >25 minutes spent in face to face time with patient, >50% spent in counselling or coordination of care.

## 2014-02-21 ENCOUNTER — Ambulatory Visit: Payer: Self-pay | Admitting: Family Medicine

## 2014-02-21 ENCOUNTER — Encounter: Payer: Self-pay | Admitting: Family Medicine

## 2014-02-22 ENCOUNTER — Encounter: Payer: Self-pay | Admitting: Family Medicine

## 2014-03-11 ENCOUNTER — Encounter: Payer: Self-pay | Admitting: Family Medicine

## 2014-03-23 ENCOUNTER — Encounter: Payer: Self-pay | Admitting: Family Medicine

## 2014-04-17 ENCOUNTER — Encounter: Payer: Self-pay | Admitting: Family Medicine

## 2014-04-17 ENCOUNTER — Other Ambulatory Visit: Payer: Self-pay | Admitting: Family Medicine

## 2014-04-17 MED ORDER — BUPROPION HCL ER (SR) 150 MG PO TB12: 150.0000 mg | ORAL_TABLET | Freq: Every day | ORAL | Status: DC

## 2014-08-05 ENCOUNTER — Other Ambulatory Visit: Payer: Self-pay | Admitting: Family Medicine

## 2014-08-05 NOTE — Telephone Encounter (Signed)
Electronic refill request. Last Filled:   04/17/2014     #60 with 3 RF.  Does patient need recheck?  Please advise.

## 2014-08-06 ENCOUNTER — Encounter: Payer: Self-pay | Admitting: Family Medicine

## 2014-08-06 NOTE — Telephone Encounter (Signed)
Okay to continue, sent.  Thanks.  

## 2014-08-08 ENCOUNTER — Other Ambulatory Visit: Payer: Self-pay | Admitting: Family Medicine

## 2014-08-08 MED ORDER — CHOLECALCIFEROL 25 MCG (1000 UT) PO CAPS: 2000.0000 [IU] | ORAL_CAPSULE | Freq: Every day | ORAL | Status: DC

## 2014-08-08 MED ORDER — NORGESTIM-ETH ESTRAD TRIPHASIC 0.18/0.215/0.25 MG-35 MCG PO TABS: 1.0000 | ORAL_TABLET | Freq: Every day | ORAL | Status: DC

## 2014-08-29 ENCOUNTER — Ambulatory Visit (INDEPENDENT_AMBULATORY_CARE_PROVIDER_SITE_OTHER): Payer: 59 | Admitting: Family Medicine

## 2014-08-29 ENCOUNTER — Encounter: Payer: Self-pay | Admitting: Family Medicine

## 2014-08-29 VITALS — BP 142/94 | HR 103 | Temp 98.9°F | Wt 258.0 lb

## 2014-08-29 DIAGNOSIS — E559 Vitamin D deficiency, unspecified: Secondary | ICD-10-CM | POA: Diagnosis not present

## 2014-08-29 DIAGNOSIS — F4321 Adjustment disorder with depressed mood: Secondary | ICD-10-CM

## 2014-08-29 LAB — VITAMIN D 25 HYDROXY (VIT D DEFICIENCY, FRACTURES): VITD: 27.72 ng/mL — AB (ref 30.00–100.00)

## 2014-08-29 NOTE — Patient Instructions (Addendum)
Keep going with the higher dose of wellbutrin and update me as needed.  We may need to consider a low dose of a SSRI with it.   Go to the lab on the way out.  We'll contact you with your lab report. Take care.  Glad to see you.

## 2014-08-29 NOTE — Progress Notes (Signed)
Pre visit review using our clinic review tool, if applicable. No additional management support is needed unless otherwise documented below in the visit note.  Grief f/u.  She was noting that she was "miserable" when still on 1 tab of wellbutrin a day.  She is still working through the loss of her Dad.  Less enjoyment overall, since he died.  More worry, overly concerned about safety, less tolerance for uncertainty.  Still caring for her mother and extended family.   "My dad made me feel likely family and now he's gone."   She recently started back on the higher dose of wellbutrin, hasn't had time for effect yet.  No SI/HI.   She still wants to care for her daughter and that orients her day.   Due for recheck vit D.  See notes on labs.    Meds, vitals, and allergies reviewed.   ROS: See HPI.  Otherwise, noncontributory.  nad ncat Tearful but regains composure Mmm Neck supple, no LA rrr ctab Speech and judgement intact.

## 2014-09-01 ENCOUNTER — Other Ambulatory Visit: Payer: Self-pay | Admitting: Family Medicine

## 2014-09-01 DIAGNOSIS — E559 Vitamin D deficiency, unspecified: Secondary | ICD-10-CM | POA: Insufficient documentation

## 2014-09-02 NOTE — Assessment & Plan Note (Addendum)
Continue with BID wellbutrin and update me as needed. We may need to consider a low dose of a SSRI with it.  D/w pt.  She agrees.  No SI/HI, still okay for outpatient f/u.  >25 minutes spent in face to face time with patient, >50% spent in counselling or coordination of care.

## 2014-09-02 NOTE — Assessment & Plan Note (Signed)
See notes on labs. 

## 2014-09-10 ENCOUNTER — Encounter: Payer: Self-pay | Admitting: Family Medicine

## 2014-09-11 ENCOUNTER — Other Ambulatory Visit: Payer: Self-pay | Admitting: Family Medicine

## 2014-09-11 MED ORDER — ESCITALOPRAM OXALATE 5 MG PO TABS: 5.0000 mg | ORAL_TABLET | Freq: Every day | ORAL | Status: DC

## 2014-11-07 ENCOUNTER — Encounter: Payer: Self-pay | Admitting: Family Medicine

## 2014-11-07 ENCOUNTER — Encounter: Payer: BLUE CROSS/BLUE SHIELD | Admitting: Family Medicine

## 2014-11-11 ENCOUNTER — Other Ambulatory Visit: Payer: Self-pay | Admitting: Family Medicine

## 2014-11-13 ENCOUNTER — Other Ambulatory Visit: Payer: Self-pay | Admitting: Family Medicine

## 2014-11-13 MED ORDER — NORGESTIM-ETH ESTRAD TRIPHASIC 0.18/0.215/0.25 MG-25 MCG PO TABS: 1.0000 | ORAL_TABLET | Freq: Every day | ORAL | Status: DC

## 2015-01-09 ENCOUNTER — Encounter: Payer: Self-pay | Admitting: Family Medicine

## 2015-02-11 ENCOUNTER — Encounter: Payer: Self-pay | Admitting: Family Medicine

## 2015-02-12 ENCOUNTER — Other Ambulatory Visit: Payer: Self-pay | Admitting: Family Medicine

## 2015-02-12 MED ORDER — LEXAPRO 5 MG PO TABS: 7.5000 mg | ORAL_TABLET | Freq: Every day | ORAL | Status: DC

## 2015-02-22 ENCOUNTER — Other Ambulatory Visit: Payer: Self-pay | Admitting: Family Medicine

## 2015-02-22 ENCOUNTER — Encounter: Payer: Self-pay | Admitting: Family Medicine

## 2015-02-23 ENCOUNTER — Other Ambulatory Visit: Payer: Self-pay | Admitting: Family Medicine

## 2015-02-23 MED ORDER — NORGESTIM-ETH ESTRAD TRIPHASIC 0.18/0.215/0.25 MG-35 MCG PO TABS: 1.0000 | ORAL_TABLET | Freq: Every day | ORAL | Status: DC

## 2015-03-06 ENCOUNTER — Other Ambulatory Visit: Payer: 59

## 2015-03-18 ENCOUNTER — Other Ambulatory Visit: Payer: Self-pay | Admitting: Family Medicine

## 2015-03-18 ENCOUNTER — Encounter: Payer: Self-pay | Admitting: Family Medicine

## 2015-03-18 NOTE — Telephone Encounter (Signed)
See note from patient/pharmacy Last office visit 08/29/14 See allergy/contraindication Is it okay to refill?

## 2015-03-19 ENCOUNTER — Other Ambulatory Visit: Payer: Self-pay | Admitting: Family Medicine

## 2015-03-19 MED ORDER — BUPROPION HCL ER (SR) 150 MG PO TB12
150.0000 mg | ORAL_TABLET | Freq: Every day | ORAL | Status: DC
Start: 2015-03-19 — End: 2015-04-18

## 2015-03-19 NOTE — Telephone Encounter (Signed)
Sent. Thanks.   

## 2015-03-19 NOTE — Telephone Encounter (Signed)
Done

## 2015-03-19 NOTE — Telephone Encounter (Signed)
Please see patient emails (3) before approving this.  I'm not sure what to do.

## 2015-03-23 ENCOUNTER — Telehealth: Payer: Self-pay | Admitting: Family Medicine

## 2015-03-23 NOTE — Telephone Encounter (Signed)
Pt has missed a dose of her medication that she needed to take last night.  Her insurance company needs an appeal filed to get her med filled.  They have changed the formulary and she cannot afford the med. Please call pt.

## 2015-03-24 NOTE — Telephone Encounter (Signed)
Left message on patient's voicemail to return call on Monday afternoon.  Left detailed message on voicemaill Tuesday morning to return call with info as to what is on the formulary that may be equivalent.

## 2015-03-26 ENCOUNTER — Encounter: Payer: Self-pay | Admitting: Family Medicine

## 2015-03-26 NOTE — Telephone Encounter (Signed)
Patient returned Lugene's call.  Patient said she has tried the generic bcp before.  They upset her stomach and she felt bad . Patient would like for you to try and get it covered again. Patient can be reached at [phone].

## 2015-03-26 NOTE — Telephone Encounter (Signed)
Left detailed message on voicemail again (3rd time in 4 days) to return call.

## 2015-03-27 ENCOUNTER — Other Ambulatory Visit: Payer: Self-pay | Admitting: Family Medicine

## 2015-03-27 MED ORDER — BUPROPION HCL 75 MG PO TABS: 75.0000 mg | ORAL_TABLET | Freq: Every day | ORAL | Status: DC

## 2015-03-27 NOTE — Telephone Encounter (Signed)
PA for [facility] (DAW) submitted thru [employer], awaiting response.  Notification was given that the patient may have a separate Rx card and left message on patient's VM that if that is the case, please submit that ID # to us.

## 2015-04-06 NOTE — Telephone Encounter (Signed)
Approval letter received and scanned. 

## 2015-04-18 ENCOUNTER — Other Ambulatory Visit: Payer: Self-pay | Admitting: Family Medicine

## 2015-04-20 NOTE — Telephone Encounter (Signed)
Electronic refill request. Escitalopram 5 mg refilled #15 on 03/19/15, not on patient's current medication list.  Please advise.  Bupropion 150 mg  Last Filled:   On 08/06/14 #60 with 3 RF.  Please advise.

## 2015-04-21 NOTE — Telephone Encounter (Signed)
Should be off lexapro.  Other rx sent.  Thanks.

## 2015-05-04 ENCOUNTER — Ambulatory Visit (INDEPENDENT_AMBULATORY_CARE_PROVIDER_SITE_OTHER): Payer: 59 | Admitting: Family Medicine

## 2015-05-04 ENCOUNTER — Ambulatory Visit (INDEPENDENT_AMBULATORY_CARE_PROVIDER_SITE_OTHER)
Admission: RE | Admit: 2015-05-04 | Discharge: 2015-05-04 | Disposition: A | Discharge status: Home / Self Care | Payer: 59 | Source: Ambulatory Visit | Attending: Family Medicine | Admitting: Family Medicine

## 2015-05-04 ENCOUNTER — Encounter: Payer: Self-pay | Admitting: Family Medicine

## 2015-05-04 VITALS — BP 140/98 | HR 84 | Temp 98.3°F | Wt 264.0 lb

## 2015-05-04 DIAGNOSIS — S61452A Open bite of left hand, initial encounter: Secondary | ICD-10-CM

## 2015-05-04 DIAGNOSIS — L089 Local infection of the skin and subcutaneous tissue, unspecified: Secondary | ICD-10-CM

## 2015-05-04 DIAGNOSIS — W5501XA Bitten by cat, initial encounter: Principal | ICD-10-CM

## 2015-05-04 MED ORDER — DOXYCYCLINE HYCLATE 100 MG PO TABS: 100.0000 mg | ORAL_TABLET | Freq: Two times a day (BID) | ORAL | Status: DC

## 2015-05-04 MED ORDER — OXYCODONE-ACETAMINOPHEN 5-325 MG PO TABS: 1.0000 | ORAL_TABLET | Freq: Three times a day (TID) | ORAL | Status: DC | PRN

## 2015-05-04 NOTE — Progress Notes (Signed)
Subjective:   Patient ID: Jessica Barron, female    DOB: Jun 07, 1980, 35 y.o.   MRN: [id number]  Jessica Barron is a pleasant 35 y.o. year old female pt of Dr. Para March, new to me,  who presents to clinic today with Animal Bite  on 05/04/2015  HPI:  Father's cat bit her twice on her left hand yesterday.  She and her husband irrigated it with saline.  Has not been bleeding but very swollen.  Cannot extend her 2-4th digits on her hand because of the swelling and pain.  Cat has had it's rabies vaccinations.  Abriana had her last tetanus shot in 2013.  Motrin is not helping with pain- could not sleep last night. Current Outpatient Prescriptions on File Prior to Visit  Medication Sig Dispense Refill  . buPROPion (WELLBUTRIN SR) 150 MG 12 hr tablet Take 1 tablet (150 mg total) by mouth daily. 90 tablet 1  . buPROPion (WELLBUTRIN) 75 MG tablet Take 1 tablet (75 mg total) by mouth daily. Take 12 hours after the  tab 30 tablet 1  . Cholecalciferol ([employer]S VITAMIN D3) 1000 UNITS capsule Take 2 capsules (2,000 Units total) by mouth daily.    . Norgestimate-Ethinyl Estradiol Triphasic (ORTHO TRI-CYCLEN, 28,) 0.18/0.215/0.25 MG-35 MCG tablet Take 1 tablet by mouth daily. 1 Package 2   No current facility-administered medications on file prior to visit.    Allergies  Allergen Reactions  . Lexapro [Escitalopram Oxalate] Other (See Comments)    Blunted affect    Past Medical History  Diagnosis Date  . Abnormal Pap smear     repeat WNL  . History of PCOS   . Breech presentation   . GERD (gastroesophageal reflux disease)     Past Surgical History  Procedure Laterality Date  . Cholecystectomy    . Cesarean section  02/25/2011    Procedure: CESAREAN SECTION;  Surgeon: Leslie Andrea, MD;  Location: [facility];  Service: Gynecology;  Laterality: N/A;    Family History  Problem Relation Age of Onset  . Anesthesia problems Neg Hx   . Colon cancer Neg Hx   . Bladder Cancer Mother   .  COPD Mother   . Bladder Cancer Father   . Breast cancer Paternal Grandmother     Social History   Social History  . Marital Status: Married    Spouse Name: N/A  . Number of Children: N/A  . Years of Education: N/A   Occupational History  . Not on file.   Social History Main Topics  . Smoking status: Never Smoker   . Smokeless tobacco: Never Used  . Alcohol Use: Yes  . Drug Use: No  . Sexual Activity: Yes    Birth Control/ Protection: None, Pill   Other Topics Concern  . Not on file   Social History Narrative   Married 2010   1 daughter   Stay at home mom   The PMH, PSH, Social History, Family History, Medications, and allergies have been reviewed in [facility], and have been updated if relevant.   Review of Systems  Constitutional: Negative.   Respiratory: Negative.   Cardiovascular: Negative.   Skin: Positive for color change.  Neurological: Negative.   Hematological: Negative.   All other systems reviewed and are negative.      Objective:    BP 140/98 mmHg  Pulse 84  Temp(Src) 98.3 F (36.8 C)  Wt 264 lb (119.75 kg)  LMP 04/10/2015   Physical Exam  Constitutional: She is  oriented to person, place, and time. She appears well-developed and well-nourished. No distress.  HENT:  Head: Normocephalic and atraumatic.  Eyes: Conjunctivae are normal.  Cardiovascular: Normal rate.   Pulmonary/Chest: Effort normal.  Musculoskeletal:       Hands: Neurological: She is alert and oriented to person, place, and time. No cranial nerve deficit.  Skin: Skin is warm. She is not diaphoretic.  Psychiatric: She has a normal mood and affect. Her behavior is normal. Judgment and thought content normal.  Nursing note and vitals reviewed.         Assessment & Plan:   Cat bite of left hand including fingers with infection, initial encounter - Plan: DG Hand Complete Left, Ambulatory referral to Hand Surgery No Follow-up on file.

## 2015-05-04 NOTE — Patient Instructions (Signed)
Good to see you. Take doxycyline as directed- 1 tablet twice daily x 7 days. I will call you with your xray results.  Please stop by to see Shirlee LimerickMarion on your way out.

## 2015-05-04 NOTE — Progress Notes (Signed)
Pre visit review using our clinic review tool, if applicable. No additional management support is needed unless otherwise documented below in the visit note. 

## 2015-05-04 NOTE — Assessment & Plan Note (Signed)
New- Doxycyline 100 mg twice daily x 7 days. No open wounds to irrigate. Xray today to rule out fracture and urgent referral to hand surgeon as I am concerned about soft tissue swelling and limited ROM. Percocet _#20 rx given to pt today for severe pain. The patient indicates understanding of these issues and agrees with the plan.

## 2015-05-06 ENCOUNTER — Encounter: Payer: Self-pay | Admitting: Family Medicine

## 2015-05-14 ENCOUNTER — Other Ambulatory Visit: Payer: Self-pay | Admitting: Family Medicine

## 2015-05-14 NOTE — Telephone Encounter (Signed)
Electronic refill request. Last Filled:   Triprevifem  1 Package 2 02/23/2015  Last Filled:   Bupropion  90 tablet 1 04/21/2015  Please advise.

## 2015-05-15 NOTE — Telephone Encounter (Signed)
Sent!

## 2015-05-27 ENCOUNTER — Encounter: Payer: Self-pay | Admitting: Family Medicine

## 2015-05-27 ENCOUNTER — Other Ambulatory Visit: Payer: Self-pay | Admitting: Family Medicine

## 2015-05-27 MED ORDER — BUPROPION HCL ER (SR) 200 MG PO TB12: 200.0000 mg | ORAL_TABLET | Freq: Two times a day (BID) | ORAL | Status: DC

## 2015-07-14 ENCOUNTER — Encounter: Payer: Self-pay | Admitting: Family Medicine

## 2015-11-04 ENCOUNTER — Encounter: Payer: Self-pay | Admitting: Family Medicine

## 2015-11-05 ENCOUNTER — Telehealth: Payer: Self-pay | Admitting: Family Medicine

## 2015-11-05 NOTE — Telephone Encounter (Signed)
Please call pt.  It wasn't clear to me if she was near the beginning or end of her current pack of OCPs.   If she is near the end of her pack of pills, then I would take it until her menses start and then resume the new pack after her period. If she is near the beginning of her pack of pills, then I would just take it daily until the pack is used up and then get the next refill.  Thanks.

## 2015-11-05 NOTE — Telephone Encounter (Signed)
LMTRC

## 2015-11-09 NOTE — Telephone Encounter (Signed)
Patient advised.

## 2015-11-17 ENCOUNTER — Other Ambulatory Visit: Payer: Self-pay | Admitting: Family Medicine

## 2015-11-23 ENCOUNTER — Encounter: Payer: Self-pay | Admitting: Family Medicine

## 2015-11-25 ENCOUNTER — Other Ambulatory Visit: Payer: Self-pay | Admitting: Family Medicine

## 2015-12-04 ENCOUNTER — Encounter: Payer: Self-pay | Admitting: Family Medicine

## 2015-12-04 ENCOUNTER — Encounter: Payer: 59 | Admitting: Family Medicine

## 2015-12-28 ENCOUNTER — Encounter: Payer: 59 | Admitting: Family Medicine

## 2015-12-31 ENCOUNTER — Other Ambulatory Visit: Payer: Self-pay | Admitting: Family Medicine

## 2015-12-31 NOTE — Telephone Encounter (Signed)
Electronic refill request. Last Filled:    60 tablet 0 11/17/2015  Last office visit:   CPE 11/07/14   Canceled 2 CPE appts one on 12/04/15 and another on 12/28/15.  No upcoming appts scheduled.  Please advise.

## 2016-01-01 NOTE — Telephone Encounter (Signed)
Sent. Thanks.   

## 2016-01-04 ENCOUNTER — Other Ambulatory Visit: Payer: Self-pay | Admitting: Family Medicine

## 2016-01-04 NOTE — Telephone Encounter (Signed)
Electronic refill request. Last Filled:    28 tablet 0 11/25/2015  Last office visit:   CPE 11/07/14 Scheduled CPE on 12/04/15 and 12/28/15 and canceled both appts.   Not rescheduled. Please advise.

## 2016-01-05 NOTE — Telephone Encounter (Signed)
Needs to make/keep OV.  Sent.

## 2016-01-05 NOTE — Telephone Encounter (Signed)
Patient advised.

## 2016-01-28 ENCOUNTER — Other Ambulatory Visit: Payer: Self-pay | Admitting: Family Medicine

## 2016-01-28 NOTE — Telephone Encounter (Signed)
Received refill electronically Last refill 01/01/16 #60 Last office visit 05/04/15/acute

## 2016-01-29 ENCOUNTER — Encounter: Payer: Self-pay | Admitting: Family Medicine

## 2016-01-29 ENCOUNTER — Telehealth: Payer: Self-pay

## 2016-01-29 ENCOUNTER — Encounter (INDEPENDENT_AMBULATORY_CARE_PROVIDER_SITE_OTHER): Payer: Self-pay | Admitting: Family Medicine

## 2016-01-29 DIAGNOSIS — Z0289 Encounter for other administrative examinations: Secondary | ICD-10-CM

## 2016-01-29 NOTE — Telephone Encounter (Signed)
Pt called to apologize for being late/ missing her appt. Pt states she wasn't aware of the the time policy she thought it was 15 minutes instead of 10 she said she did eveything she could to make it on time. She said she even got up early to allow her to make it on time but "there were other circumstances outside of her control". She said she's due to start her birth control this Sunday. She also wanted to let you know that her "mom cut her oxygen tubing with scissors". She said she respects you and she understands that you have a "schedule to follow". Requesting a call back.

## 2016-01-29 NOTE — Telephone Encounter (Signed)
Pt called to ck on status of bupropion and BC pill; advised pt bupropion was already sent to [facility] in [place] and Dr Para Marchuncan would [facility] the mychart notes pt had already sent today. Pt voiced understanding.

## 2016-01-29 NOTE — Telephone Encounter (Signed)
Sent. Thanks.   

## 2016-01-29 NOTE — Telephone Encounter (Signed)
She has send [employer] messages and I'll address this via mychart.

## 2016-02-09 ENCOUNTER — Encounter: Payer: Self-pay | Admitting: Family Medicine

## 2016-02-09 ENCOUNTER — Ambulatory Visit (INDEPENDENT_AMBULATORY_CARE_PROVIDER_SITE_OTHER): Payer: 59 | Admitting: Family Medicine

## 2016-02-09 VITALS — BP 160/102 | HR 101 | Temp 98.4°F | Wt 249.5 lb

## 2016-02-09 DIAGNOSIS — Z6379 Other stressful life events affecting family and household: Secondary | ICD-10-CM

## 2016-02-09 DIAGNOSIS — E559 Vitamin D deficiency, unspecified: Secondary | ICD-10-CM | POA: Diagnosis not present

## 2016-02-09 DIAGNOSIS — Z0001 Encounter for general adult medical examination with abnormal findings: Secondary | ICD-10-CM | POA: Diagnosis not present

## 2016-02-09 DIAGNOSIS — Z124 Encounter for screening for malignant neoplasm of cervix: Secondary | ICD-10-CM

## 2016-02-09 DIAGNOSIS — R030 Elevated blood-pressure reading, without diagnosis of hypertension: Secondary | ICD-10-CM

## 2016-02-09 DIAGNOSIS — Z23 Encounter for immunization: Secondary | ICD-10-CM

## 2016-02-09 LAB — COMPREHENSIVE METABOLIC PANEL
ALT: 19 U/L (ref 0–35)
AST: 15 U/L (ref 0–37)
Albumin: 4.3 g/dL (ref 3.5–5.2)
Alkaline Phosphatase: 48 U/L (ref 39–117)
BUN: 12 mg/dL (ref 6–23)
CALCIUM: 9.6 mg/dL (ref 8.4–10.5)
CO2: 28 mEq/L (ref 19–32)
CREATININE: 0.83 mg/dL (ref 0.40–1.20)
Chloride: 102 mEq/L (ref 96–112)
GFR: 82.51 mL/min (ref >60.00)
GLUCOSE: 107 mg/dL — AB (ref 70–99)
Potassium: 4.3 mEq/L (ref 3.5–5.1)
Sodium: 138 mEq/L (ref 135–145)
Total Bilirubin: 0.4 mg/dL (ref 0.2–1.2)
Total Protein: 7.1 g/dL (ref 6.0–8.3)

## 2016-02-09 LAB — LIPID PANEL
Cholesterol: 259 mg/dL — ABNORMAL HIGH (ref 0–200)
HDL: 63.5 mg/dL (ref >39.00)
LDL CALC: 160 mg/dL — AB (ref 0–99)
NonHDL: 195.2
Total CHOL/HDL Ratio: 4
Triglycerides: 176 mg/dL — ABNORMAL HIGH (ref 0.0–149.0)
VLDL: 35.2 mg/dL (ref 0.0–40.0)

## 2016-02-09 LAB — VITAMIN D 25 HYDROXY (VIT D DEFICIENCY, FRACTURES): VITD: 51.42 ng/mL (ref 30.00–100.00)

## 2016-02-09 MED ORDER — NORGESTIM-ETH ESTRAD TRIPHASIC 0.18/0.215/0.25 MG-35 MCG PO TABS: 1.0000 | ORAL_TABLET | Freq: Every day | ORAL | 3 refills | Status: DC

## 2016-02-09 MED ORDER — BUPROPION HCL ER (SR) 200 MG PO TB12: 200.0000 mg | ORAL_TABLET | Freq: Two times a day (BID) | ORAL | 12 refills | Status: DC

## 2016-02-09 NOTE — Progress Notes (Signed)
Pre visit review using our clinic review tool, if applicable. No additional management support is needed unless otherwise documented below in the visit note. 

## 2016-02-09 NOTE — Progress Notes (Signed)
CPE- See plan.  Routine anticipatory guidance given to patient.  See health maintenance.  Tetanus 2013 Flu encouraged.  D/w pt.  Done today at OV.  PNA not due Shingles not due Pap 2015 needs referral.  D/w pt.  Mammogram not due DXA not due Colon cancer screening not due.   Living will d/w pt.  Husband designated if patient were incapacitated.   Diet and exercise d/w pt.  She is working on weight loss.  D/w pt.    Recheck BP 150/90.  D/w pt.  See AVS.   Family stressors noted.  She is looking after her mother.  Still on wellbutrin 200mg  BID.  She had missed a few early AM doses and she has noted changes on those days, she does better on the day with BID dosing.  No SI/HI.    PMH and SH reviewed  Meds, vitals, and allergies reviewed.   ROS: Per HPI.  Unless specifically indicated otherwise in HPI, the patient denies:  General: fever. Eyes: acute vision changes ENT: sore throat Cardiovascular: chest pain Respiratory: SOB GI: vomiting GU: dysuria Musculoskeletal: acute back pain Derm: acute rash Neuro: acute motor dysfunction Psych: worsening mood Endocrine: polydipsia Heme: bleeding Allergy: hayfever  GEN: nad, alert and oriented HEENT: mucous membranes moist NECK: supple w/o LA CV: rrr. PULM: ctab, no inc wob ABD: soft, +bs EXT: no edema SKIN: no acute rash

## 2016-02-09 NOTE — Patient Instructions (Addendum)
Go to the lab on the way out.  We'll contact you with your lab report. Shirlee LimerickMarion will call about your referral. Take care.  Glad to see you.  Update me if your BP is consistently >140/>90.

## 2016-02-11 ENCOUNTER — Other Ambulatory Visit: Payer: Self-pay | Admitting: Family Medicine

## 2016-02-11 DIAGNOSIS — E785 Hyperlipidemia, unspecified: Secondary | ICD-10-CM

## 2016-02-11 DIAGNOSIS — R739 Hyperglycemia, unspecified: Secondary | ICD-10-CM | POA: Insufficient documentation

## 2016-02-11 DIAGNOSIS — R030 Elevated blood-pressure reading, without diagnosis of hypertension: Secondary | ICD-10-CM | POA: Insufficient documentation

## 2016-02-11 DIAGNOSIS — Z0000 Encounter for general adult medical examination without abnormal findings: Secondary | ICD-10-CM | POA: Insufficient documentation

## 2016-02-11 HISTORY — DX: Hyperlipidemia, unspecified: E78.5

## 2016-02-11 NOTE — Assessment & Plan Note (Signed)
Tetanus 2013 Flu encouraged.  D/w pt.  Done today at OV.  PNA not due Shingles not due Pap 2015 needs referral.  D/w pt. See orders.   Mammogram not due DXA not due Colon cancer screening not due.   Living will d/w pt.  Husband designated if patient were incapacitated.   Diet and exercise d/w pt.  She is working on weight loss.  D/w pt.

## 2016-02-11 NOTE — Assessment & Plan Note (Signed)
Would continue with wellbutrin BID.  She does better with that med, okay for outpatient f/u.  D/w pt about the chronic stress of caring for her mother.

## 2016-02-11 NOTE — Assessment & Plan Note (Signed)
H/o, see notes on labs.   

## 2016-02-11 NOTE — Assessment & Plan Note (Signed)
See notes on labs.  She'll update me if BP is consistently >140/>90.  Okay for outpatient f/u.

## 2016-02-15 ENCOUNTER — Encounter: Payer: Self-pay | Admitting: *Deleted

## 2016-02-22 ENCOUNTER — Encounter: Payer: 59 | Admitting: Obstetrics and Gynecology

## 2016-03-01 ENCOUNTER — Other Ambulatory Visit: Payer: Self-pay | Admitting: Family Medicine

## 2016-03-01 NOTE — Telephone Encounter (Signed)
Okay to fill?  Patient had CPE 02/09/16.  Please advise.

## 2016-03-07 ENCOUNTER — Encounter: Payer: 59 | Admitting: Obstetrics & Gynecology

## 2016-03-28 ENCOUNTER — Other Ambulatory Visit: Payer: Self-pay | Admitting: Family Medicine

## 2016-04-04 ENCOUNTER — Encounter: Payer: 59 | Admitting: Obstetrics & Gynecology

## 2016-05-09 ENCOUNTER — Encounter: Payer: Self-pay | Admitting: Obstetrics & Gynecology

## 2016-05-09 ENCOUNTER — Ambulatory Visit (INDEPENDENT_AMBULATORY_CARE_PROVIDER_SITE_OTHER): Payer: 59 | Admitting: Obstetrics & Gynecology

## 2016-05-09 VITALS — BP 153/94 | HR 106 | Resp 18 | Ht 62.0 in | Wt 255.0 lb

## 2016-05-09 DIAGNOSIS — Z01419 Encounter for gynecological examination (general) (routine) without abnormal findings: Secondary | ICD-10-CM

## 2016-05-09 DIAGNOSIS — E282 Polycystic ovarian syndrome: Secondary | ICD-10-CM

## 2016-05-09 DIAGNOSIS — N920 Excessive and frequent menstruation with regular cycle: Secondary | ICD-10-CM

## 2016-05-09 NOTE — Patient Instructions (Signed)
Preventive Care 18-39 Years, Female Preventive care refers to lifestyle choices and visits with your health care provider that can promote health and wellness. What does preventive care include?  A yearly physical exam. This is also called an annual well check.  Dental exams once or twice a year.  Routine eye exams. Ask your health care provider how often you should have your eyes checked.  Personal lifestyle choices, including:  Daily care of your teeth and gums.  Regular physical activity.  Eating a healthy diet.  Avoiding tobacco and drug use.  Limiting alcohol use.  Practicing safe sex.  Taking vitamin and mineral supplements as recommended by your health care provider. What happens during an annual well check? The services and screenings done by your health care provider during your annual well check will depend on your age, overall health, lifestyle risk factors, and family history of disease. Counseling  Your health care provider may ask you questions about your:  Alcohol use.  Tobacco use.  Drug use.  Emotional well-being.  Home and relationship well-being.  Sexual activity.  Eating habits.  Work and work environment.  Method of birth control.  Menstrual cycle.  Pregnancy history. Screening  You may have the following tests or measurements:  Height, weight, and BMI.  Diabetes screening. This is done by checking your blood sugar (glucose) after you have not eaten for a while (fasting).  Blood pressure.  Lipid and cholesterol levels. These may be checked every 5 years starting at age 20.  Skin check.  Hepatitis C blood test.  Hepatitis B blood test.  Sexually transmitted disease (STD) testing.  BRCA-related cancer screening. This may be done if you have a family history of breast, ovarian, tubal, or peritoneal cancers.  Pelvic exam and Pap test. This may be done every 3 years starting at age 21. Starting at age 30, this may be done every 5  years if you have a Pap test in combination with an HPV test. Discuss your test results, treatment options, and if necessary, the need for more tests with your health care provider. Vaccines  Your health care provider may recommend certain vaccines, such as:  Influenza vaccine. This is recommended every year.  Tetanus, diphtheria, and acellular pertussis (Tdap, Td) vaccine. You may need a Td booster every 10 years.  Varicella vaccine. You may need this if you have not been vaccinated.  HPV vaccine. If you are 26 or younger, you may need three doses over 6 months.  Measles, mumps, and rubella (MMR) vaccine. You may need at least one dose of MMR. You may also need a second dose.  Pneumococcal 13-valent conjugate (PCV13) vaccine. You may need this if you have certain conditions and were not previously vaccinated.  Pneumococcal polysaccharide (PPSV23) vaccine. You may need one or two doses if you smoke cigarettes or if you have certain conditions.  Meningococcal vaccine. One dose is recommended if you are age 19-21 years and a first-year college student living in a residence hall, or if you have one of several medical conditions. You may also need additional booster doses.  Hepatitis A vaccine. You may need this if you have certain conditions or if you travel or work in places where you may be exposed to hepatitis A.  Hepatitis B vaccine. You may need this if you have certain conditions or if you travel or work in places where you may be exposed to hepatitis B.  Haemophilus influenzae type b (Hib) vaccine. You may need this   if you have certain risk factors. Talk to your health care provider about which screenings and vaccines you need and how often you need them. This information is not intended to replace advice given to you by your health care provider. Make sure you discuss any questions you have with your health care provider. Document Released: 03/08/2001 Document Revised: 09/30/2015  Document Reviewed: 11/11/2014 Elsevier Interactive Patient Education  2017 [employer].

## 2016-05-09 NOTE — Progress Notes (Signed)
GYNECOLOGY ANNUAL PREVENTATIVE CARE ENCOUNTER NOTE  Subjective:   Jessica Barron is a 36 y.o. G56P1001 female here for a routine annual gynecologic exam and to establish care.  Current complaints: had a very heavy menstrual period last month, normal period this month. Has history of PCOS and is on OCPs but reports having thickened endometrial stripe in the past requiring biopsy; pathology was benign.  She is concerned about possible endometrial pathology and desires ultrasound; also wants to follow up her history of polycystic ovaries.    Denies abnormal vaginal bleeding, discharge, pelvic pain, problems with intercourse or other gynecologic concerns.    Gynecologic History Patient's last menstrual period was 04/25/2016. Contraception: OCP (estrogen/progesterone) Last Pap: 2015. Results were: normal  Obstetric History OB History  Gravida Para Term Preterm AB Living  0 0 1  SAB TAB Ectopic Multiple Live Births  0 0 0 0 1    # Outcome Date GA Lbr Len/2nd Weight Sex Delivery Anes PTL Lv  1 Term 02/25/11 [redacted]w[redacted]d  9 lb 8.6 oz (4.325 kg) F CS-LTranv Spinal  LIV     Complications: Breech presentation of fetus     Birth Comments: NICU stay for persistent hypoglycemia Passed hearing screen      Past Medical History:  Diagnosis Date  . Abnormal Pap smear    repeat WNL  . Depression   . GERD (gastroesophageal reflux disease)   . History of PCOS     Past Surgical History:  Procedure Laterality Date  . CESAREAN SECTION  02/25/2011   Procedure: CESAREAN SECTION;  Surgeon: Leslie Andrea, MD;  Location: [facility];  Service: Gynecology;  Laterality: N/A;  . CHOLECYSTECTOMY      Current Outpatient Prescriptions on File Prior to Visit  Medication Sig Dispense Refill  . buPROPion (WELLBUTRIN SR) 200 MG 12 hr tablet Take 1 tablet (200 mg total) by mouth 2 (two) times daily. 60 tablet 12  . Cholecalciferol ([employer]S VITAMIN D3) 1000 UNITS capsule Take 2 capsules (2,000 Units total) by  mouth daily.    . Norgestimate-Ethinyl Estradiol Triphasic (TRINESSA, 28,) 0.18/0.215/0.25 MG-35 MCG tablet Take 1 tablet by mouth daily. 28 tablet 3   No current facility-administered medications on file prior to visit.     Allergies  Allergen Reactions  . Lexapro [Escitalopram Oxalate] Other (See Comments)    Blunted affect    Social History   Social History  . Marital status: Married    Spouse name: N/A  . Number of children: N/A  . Years of education: N/A   Occupational History  . Not on file.   Social History Main Topics  . Smoking status: Never Smoker  . Smokeless tobacco: Never Used  . Alcohol use Yes     Comment: rare  . Drug use: No  . Sexual activity: Yes    Birth control/ protection: Pill   Other Topics Concern  . Not on file   Social History Narrative   Married 2010   1 daughter   Stay at home mom    Family History  Problem Relation Age of Onset  . Bladder Cancer Mother   . COPD Mother   . Cancer Mother     bladder  . Bladder Cancer Father   . Cancer Father     bladder  . Breast cancer Paternal Grandmother   . Anesthesia problems Neg Hx   . Colon cancer Neg Hx     The following portions of the patient's  history were reviewed and updated as appropriate: allergies, current medications, past family history, past medical history, past social history, past surgical history and problem list.  Review of Systems Pertinent items noted in HPI and remainder of comprehensive ROS otherwise negative.   Objective:  BP (!) 153/94 (BP Location: Left Arm, Patient Position: Sitting, Cuff Size: Normal)   Pulse (!) 106   Resp 18   Ht  (1.575 m)   Wt 255 lb (115.7 kg)   LMP 04/25/2016   BMI 46.64 kg/m  CONSTITUTIONAL: Well-developed, well-nourished female in no acute distress.  HENT:  Normocephalic, atraumatic, External right and left ear normal. Oropharynx is clear and moist EYES: Conjunctivae and EOM are normal. Pupils are equal, round, and reactive  to light. No scleral icterus.  NECK: Normal range of motion, supple, no masses.  Normal thyroid.  SKIN: Skin is warm and dry. No rash noted. Not diaphoretic. No erythema. No pallor. NEUROLOGIC: Alert and oriented to person, place, and time. Normal reflexes, muscle tone coordination. No cranial nerve deficit noted. PSYCHIATRIC: Normal mood and affect. Normal behavior. Normal judgment and thought content. CARDIOVASCULAR: Normal heart rate noted, regular rhythm RESPIRATORY: Clear to auscultation bilaterally. Effort and breath sounds normal, no problems with respiration noted. BREASTS: Symmetric in size. No masses, skin changes, nipple drainage, or lymphadenopathy. ABDOMEN: Soft, obese, normal bowel sounds, no distention appreciated.  No tenderness, rebound or guarding.  PELVIC: Normal appearing external genitalia; normal appearing vaginal mucosa and cervix.  Normal appearing discharge.  Pap smear obtained.  Unable to palpate uterus or adnexa secondary to habitus.  MUSCULOSKELETAL: Normal range of motion. No tenderness.  No cyanosis, clubbing, or edema.  2+ distal pulses.   Assessment and Plan :  1. Encounter for gynecological examination with Papanicolaou smear of cervix Annual gynecologic examination with pap smear  Will follow up results of pap smear and manage accordingly. - Cytology - PAP  2. Menorrhagia with regular cycle Will check labs and ultrasound, and manage accordingly.  - TSH - US Pelvis Complete; Future - US Transvaginal Non-OB; Future - CBC  3. PCOS (polycystic ovarian syndrome) Ultrasound will also help with PCOS surveillance - US Pelvis Complete; Future - US Transvaginal Non-OB; Future  Routine preventative health maintenance measures emphasized. Please refer to After Visit Summary for other counseling recommendations.    Jaynie Collins, MD, FACOG Attending Obstetrician & Gynecologist, [facility]

## 2016-05-10 LAB — CBC
HEMATOCRIT: 37.3 % (ref 34.0–46.6)
HEMOGLOBIN: 12.1 g/dL (ref 11.1–15.9)
MCH: 27.6 pg (ref 26.6–33.0)
MCHC: 32.4 g/dL (ref 31.5–35.7)
MCV: 85 fL (ref 79–97)
Platelets: 411 10*3/uL — ABNORMAL HIGH (ref 150–379)
RBC: 4.39 x10E6/uL (ref 3.77–5.28)
RDW: 14.3 % (ref 12.3–15.4)
WBC: 7.7 10*3/uL (ref 3.4–10.8)

## 2016-05-10 LAB — TSH: TSH: 2.12 u[IU]/mL (ref 0.450–4.500)

## 2016-05-11 LAB — CYTOLOGY - PAP
DIAGNOSIS: NEGATIVE
HPV (WINDOPATH): NOT DETECTED

## 2016-05-12 ENCOUNTER — Ambulatory Visit
Admission: RE | Admit: 2016-05-12 | Discharge: 2016-05-12 | Disposition: A | Discharge status: Home / Self Care | Payer: 59 | Source: Ambulatory Visit | Attending: Obstetrics & Gynecology | Admitting: Obstetrics & Gynecology

## 2016-05-12 DIAGNOSIS — E282 Polycystic ovarian syndrome: Secondary | ICD-10-CM | POA: Diagnosis not present

## 2016-05-12 DIAGNOSIS — N920 Excessive and frequent menstruation with regular cycle: Secondary | ICD-10-CM | POA: Diagnosis present

## 2016-05-18 ENCOUNTER — Encounter: Payer: Self-pay | Admitting: Obstetrics & Gynecology

## 2016-06-20 ENCOUNTER — Other Ambulatory Visit: Payer: Self-pay | Admitting: Family Medicine

## 2016-06-22 ENCOUNTER — Emergency Department (HOSPITAL_COMMUNITY)
Admission: EM | Admit: 2016-06-22 | Discharge: 2016-06-22 | Disposition: A | Discharge status: Home / Self Care | Payer: 59 | Attending: Emergency Medicine | Admitting: Emergency Medicine

## 2016-06-22 ENCOUNTER — Encounter (HOSPITAL_COMMUNITY): Payer: Self-pay | Admitting: Emergency Medicine

## 2016-06-22 ENCOUNTER — Telehealth: Payer: Self-pay | Admitting: Family Medicine

## 2016-06-22 DIAGNOSIS — R112 Nausea with vomiting, unspecified: Secondary | ICD-10-CM | POA: Insufficient documentation

## 2016-06-22 DIAGNOSIS — R000 Tachycardia, unspecified: Secondary | ICD-10-CM | POA: Insufficient documentation

## 2016-06-22 DIAGNOSIS — R197 Diarrhea, unspecified: Secondary | ICD-10-CM | POA: Diagnosis not present

## 2016-06-22 LAB — URINALYSIS, ROUTINE W REFLEX MICROSCOPIC
Bacteria, UA: NONE SEEN
Bilirubin Urine: NEGATIVE
Glucose, UA: NEGATIVE mg/dL
Ketones, ur: 5 mg/dL — AB
Leukocytes, UA: NEGATIVE
NITRITE: NEGATIVE
PROTEIN: NEGATIVE mg/dL
SPECIFIC GRAVITY, URINE: 1.029 (ref 1.005–1.030)
pH: 5 (ref 5.0–8.0)

## 2016-06-22 LAB — CBC
HEMATOCRIT: 42.7 % (ref 36.0–46.0)
Hemoglobin: 13.5 g/dL (ref 12.0–15.0)
MCH: 27.8 pg (ref 26.0–34.0)
MCHC: 31.6 g/dL (ref 30.0–36.0)
MCV: 88 fL (ref 78.0–100.0)
Platelets: 364 10*3/uL (ref 150–400)
RBC: 4.85 MIL/uL (ref 3.87–5.11)
RDW: 13.2 % (ref 11.5–15.5)
WBC: 15.3 10*3/uL — ABNORMAL HIGH (ref 4.0–10.5)

## 2016-06-22 LAB — COMPREHENSIVE METABOLIC PANEL
ALBUMIN: 3.9 g/dL (ref 3.5–5.0)
ALK PHOS: 58 U/L (ref 38–126)
ALT: 26 U/L (ref 14–54)
ANION GAP: 10 (ref 5–15)
AST: 27 U/L (ref 15–41)
BUN: 13 mg/dL (ref 6–20)
CALCIUM: 8.9 mg/dL (ref 8.9–10.3)
CO2: 24 mmol/L (ref 22–32)
Chloride: 103 mmol/L (ref 101–111)
Creatinine, Ser: 0.95 mg/dL (ref 0.44–1.00)
GFR calc Af Amer: >60 mL/min (ref >60)
GFR calc non Af Amer: >60 mL/min (ref >60)
GLUCOSE: 124 mg/dL — AB (ref 65–99)
Potassium: 4.2 mmol/L (ref 3.5–5.1)
SODIUM: 137 mmol/L (ref 135–145)
TOTAL PROTEIN: 7.2 g/dL (ref 6.5–8.1)
Total Bilirubin: 0.8 mg/dL (ref 0.3–1.2)

## 2016-06-22 LAB — LIPASE, BLOOD: Lipase: 24 U/L (ref 11–51)

## 2016-06-22 LAB — PREGNANCY, URINE: PREG TEST UR: NEGATIVE

## 2016-06-22 MED ORDER — DIPHENHYDRAMINE HCL 50 MG/ML IJ SOLN
12.5000 mg | Freq: Once | INTRAMUSCULAR | Status: AC
  Administered 2016-06-22: 12.5 mg via INTRAVENOUS
  Filled 2016-06-22: qty 1

## 2016-06-22 MED ORDER — ONDANSETRON 4 MG PO TBDP
ORAL_TABLET | ORAL | Status: AC
  Filled 2016-06-22: qty 1

## 2016-06-22 MED ORDER — ONDANSETRON 8 MG PO TBDP: 8.0000 mg | ORAL_TABLET | Freq: Three times a day (TID) | ORAL | 0 refills | Status: DC | PRN

## 2016-06-22 MED ORDER — SODIUM CHLORIDE 0.9 % IV BOLUS (SEPSIS)
1000.0000 mL | Freq: Once | INTRAVENOUS | Status: AC
  Administered 2016-06-22: 1000 mL via INTRAVENOUS

## 2016-06-22 MED ORDER — ONDANSETRON 4 MG PO TBDP
4.0000 mg | ORAL_TABLET | Freq: Once | ORAL | Status: AC | PRN
  Administered 2016-06-22: 4 mg via ORAL

## 2016-06-22 MED ORDER — METOCLOPRAMIDE HCL 5 MG/ML IJ SOLN
10.0000 mg | Freq: Once | INTRAMUSCULAR | Status: AC
  Administered 2016-06-22: 10 mg via INTRAVENOUS
  Filled 2016-06-22: qty 2

## 2016-06-22 MED ORDER — ONDANSETRON 4 MG PO TBDP
4.0000 mg | ORAL_TABLET | Freq: Once | ORAL | Status: AC
  Administered 2016-06-22: 4 mg via ORAL

## 2016-06-22 NOTE — ED Triage Notes (Signed)
Pt to Er for nausea vomiting with 10+ episodes and 1 episode of diarrhea onset this morning at 4:30. Pt denies abd pain.

## 2016-06-22 NOTE — Telephone Encounter (Signed)
Per chart review tab pt is at [place]. 

## 2016-06-22 NOTE — Discharge Instructions (Signed)
Take zofran as prescribed as needed for nausea and vomiting. Drink plenty of fluids. If symptoms improve but heart rate remain high, follow up with primary care doctor for further evaluation. Return if worsening symptoms.

## 2016-06-22 NOTE — Telephone Encounter (Signed)
Please check on her tomorrow if not admitted

## 2016-06-22 NOTE — ED Notes (Signed)
Pt given crackers and gingerale.

## 2016-06-22 NOTE — Telephone Encounter (Signed)
Patient Name: Nathalee Lafavor DOB: 05/23/1980 Initial Comment Caller states that his wife has been vomiting and he wants to know if he should bring her to the office or urgent care. When she first woke up she would vomit every 10 minutes but now she vomits every hour. Nurse Assessment Nurse: Charna Elizabethrumbull, RN, Cathy Date/Time (Eastern Time): 06/22/2016 11:56:42 AM Confirm and document reason for call. If symptomatic, describe symptoms. ---Caller states his wife developed vomiting this morning (about 12 episodes since about 4am this morning). One episode of diarrhea this morning. No fever. Alert and responsive. Does the patient have any new or worsening symptoms? ---Yes Will a triage be completed? ---Yes Related visit to physician within the last 2 weeks? ---Yes Does the PT have any chronic conditions? (i.e. diabetes, asthma, etc.) ---Yes List chronic conditions. ---PCOS, Depression Is the patient pregnant or possibly pregnant? (Ask all females between the ages of 5912-55) ---No Is this a behavioral health or substance abuse call? ---No Guidelines Guideline Title Affirmed Question Affirmed Notes Vomiting [1] SEVERE vomiting (e.g., 6 or more times/day) AND [2] present > 8 hours Final Disposition User Go to ED Now (or PCP triage) Charna Elizabethrumbull, RN, Cathy [facility] Disagree/Comply: Comply

## 2016-06-22 NOTE — ED Provider Notes (Signed)
[facility]-EMERGENCY DEPT Provider Note   CSN: [id number] Arrival date & time: 06/22/16  1247     History   Chief Complaint Chief Complaint  Patient presents with  . Emesis    HPI Jessica Barron is a 36 y.o. female.  HPI Jessica OpitzRebecca Barron is a 36 y.o. female with history of GERD, depression, polycystic ovarian syndrome, presents to emergency department complaining of nausea, vomiting, diarrhea. Patient states her symptoms started at 4 AM this morning. She has had greater than 15 episodes of emesis. One episode of watery diarrhea. Denies abdominal pain, but states it feels "like cramping and bloating." She is unable to keep anything down. History of cholecystectomy when she was [age] years old. Denies pregnancy. States that her niece and her daughter have similar symptoms that started today as well, reports that they all ate at a cook out 2 days ago. Denies any fever or chills. Denies any blood in her stool or emesis. Patient received 2 doses of Zofran in triage and states she feels slightly improved.  Past Medical History:  Diagnosis Date  . Abnormal Pap smear    repeat WNL  . Depression   . GERD (gastroesophageal reflux disease)   . History of PCOS     Patient Active Problem List   Diagnosis Date Noted  . Encounter for general adult medical examination with abnormal findings 02/11/2016  . Elevated BP without diagnosis of hypertension 02/11/2016  . Hyperglycemia 02/11/2016  . Hyperlipidemia 02/11/2016  . Grief 02/15/2014  . PCOS (polycystic ovarian syndrome) 09/11/2013  . Stress due to illness of family member 09/11/2013    Past Surgical History:  Procedure Laterality Date  . CESAREAN SECTION  02/25/2011   Procedure: CESAREAN SECTION;  Surgeon: Leslie AndreaJames E Tomblin II, MD;  Location: [facility];  Service: Gynecology;  Laterality: N/A;  . CHOLECYSTECTOMY      OB History    Gravida Para Term Preterm AB Living   1 1 1  0 0 1   SAB TAB Ectopic Multiple Live Births   0 0 0 0 1        Home Medications    Prior to Admission medications   Medication Sig Start Date End Date Taking? Authorizing Provider  buPROPion (WELLBUTRIN SR) 200 MG 12 hr tablet Take 1 tablet (200 mg total) by mouth 2 (two) times daily. 02/09/16   Joaquim Namuncan, Graham S, MD  Cholecalciferol ([employer]S VITAMIN D3) 1000 UNITS capsule Take 2 capsules (2,000 Units total) by mouth daily. 08/08/14   Joaquim Namuncan, Graham S, MD  TRINESSA, 28, 0.18/0.215/0.25 MG-35 MCG tablet TAKE 1 TABLET BY MOUTH DAILY 06/21/16   Constant, Gigi GinPeggy, MD    Family History Family History  Problem Relation Age of Onset  . Bladder Cancer Mother   . COPD Mother   . Cancer Mother        bladder  . Bladder Cancer Father   . Cancer Father        bladder  . Breast cancer Paternal Grandmother   . Anesthesia problems Neg Hx   . Colon cancer Neg Hx     Social History Social History  Substance Use Topics  . Smoking status: Never Smoker  . Smokeless tobacco: Never Used  . Alcohol use Yes     Comment: rare     Allergies   Lexapro [escitalopram oxalate]   Review of Systems Review of Systems  Constitutional: Negative for chills and fever.  Respiratory: Negative for cough, chest tightness and shortness of breath.   Cardiovascular: Negative  for chest pain, palpitations and leg swelling.  Gastrointestinal: Positive for diarrhea, nausea and vomiting. Negative for abdominal pain.  Genitourinary: Negative for dysuria, flank pain, pelvic pain, vaginal bleeding, vaginal discharge and vaginal pain.  Musculoskeletal: Negative for arthralgias, myalgias, neck pain and neck stiffness.  Skin: Negative for rash.  Neurological: Negative for dizziness, weakness and headaches.  All other systems reviewed and are negative.    Physical Exam Updated Vital Signs BP 130/81 (BP Location: Right Arm)   Pulse (!) 115   Temp 98.7 F (37.1 C) (Oral)   Resp (!) 36   SpO2 100%   Physical Exam  Constitutional: She appears well-developed and well-nourished.  No distress.  HENT:  Head: Normocephalic.  Eyes: Conjunctivae are normal.  Neck: Neck supple.  Cardiovascular: Regular rhythm and normal heart sounds.   Tachycardic  Pulmonary/Chest: Effort normal and breath sounds normal. No respiratory distress. She has no wheezes. She has no rales.  Abdominal: Soft. Bowel sounds are normal. She exhibits no distension. There is no tenderness. There is no rebound.  Musculoskeletal: She exhibits no edema.  Neurological: She is alert.  Skin: Skin is warm and dry.  Psychiatric: She has a normal mood and affect. Her behavior is normal.  Nursing note and vitals reviewed.    ED Treatments / Results  Labs (all labs ordered are listed, but only abnormal results are displayed) Labs Reviewed  COMPREHENSIVE METABOLIC PANEL - Abnormal; Notable for the following:       Result Value   Glucose, Bld 124 (*)    All other components within normal limits  CBC - Abnormal; Notable for the following:    WBC 15.3 (*)    All other components within normal limits  URINALYSIS, ROUTINE W REFLEX MICROSCOPIC - Abnormal; Notable for the following:    APPearance HAZY (*)    Hgb urine dipstick SMALL (*)    Ketones, ur 5 (*)    Squamous Epithelial / LPF 0-5 (*)    All other components within normal limits  LIPASE, BLOOD  PREGNANCY, URINE    EKG  EKG Interpretation None       Radiology No results found.  Procedures Procedures (including critical care time)  Medications Ordered in ED Medications  ondansetron (ZOFRAN-ODT) 4 MG disintegrating tablet (not administered)  ondansetron (ZOFRAN-ODT) 4 MG disintegrating tablet (not administered)  ondansetron (ZOFRAN-ODT) disintegrating tablet 4 mg (4 mg Oral Given 06/22/16 1317)  ondansetron (ZOFRAN-ODT) disintegrating tablet 4 mg (4 mg Oral Given 06/22/16 1622)     Initial Impression / Assessment and Plan / ED Course  I have reviewed the triage vital signs and the nursing notes.  Pertinent labs & imaging results  that were available during my care of the patient were reviewed by me and considered in my medical decision making (see chart for details).     Patient in emergency department nausea, vomiting, diarrhea, onset for this morning. Patient is nontoxic-appearing, abdomen is benign, nontender. She is tachycardic and appears to be dehydrated with dry oral mucosa. We will start IV fluids, patient already had total of 8 mg of Zofran ODT in triage, will try Reglan. Blood work is significant for a blood cell count of 15.3, otherwise unremarkable. Will add on pregnancy test. Will hydrate.   Patient was given 3 L of IV fluids. Her white blood cell count is 15.3, otherwise normal labs. She is not pregnant. Patient was found to be tachycardic with heart rate around 110, that did not improve with IV fluids. I  rechecked her temperature and it was 99.6. A question of whether patient may have a low-grade temperature which could be causing some of the tachycardia. Also reviewed her prior visits during which she has had elevated heart rate into the low 100s as well. She denies any palpitations or chest pain. She states her abdominal pain improved. She is no longer having nausea or vomiting. She is tolerating oral fluids. Sinus tachycardia on the monitor. She would like to try to go home. Will prescribe Zofran for nausea and vomiting. Advised to return to emergency department if worsening pain, or any new concerning symptoms. Otherwise follow with primary care doctor. Advised to recheck her heart rate, if still elevated she will need further workup.  Vitals:   06/22/16 2145 06/22/16 2200 06/22/16 2215 06/22/16 2225  BP: 112/61 (!) 115/57 (!) 118/58 (!) 108/58  Pulse: (!) 108 (!) 110 (!) 110 (!) 111  Resp:    18  Temp:      TempSrc:      SpO2: 99% 100% 100% 100%     Final Clinical Impressions(s) / ED Diagnoses   Final diagnoses:  Nausea vomiting and diarrhea  Tachycardia    New Prescriptions New Prescriptions    ONDANSETRON (ZOFRAN ODT) 8 MG DISINTEGRATING TABLET    Take 1 tablet (8 mg total) by mouth every 8 (eight) hours as needed for nausea or vomiting.     Jaynie Crumble, PA-C 06/22/16 2340    Tegeler, Canary Brim, MD 06/23/16 [phone]

## 2016-06-23 NOTE — Telephone Encounter (Signed)
Spoke to pt. Said she is feeling better today, but still feels pretty rough. It is considered to be a stomach virus because other people in the family have it. She was given IV fluids.

## 2016-06-23 NOTE — Telephone Encounter (Signed)
Left message to call office

## 2016-07-22 ENCOUNTER — Encounter: Payer: Self-pay | Admitting: Family Medicine

## 2016-10-03 ENCOUNTER — Other Ambulatory Visit: Payer: Self-pay | Admitting: Obstetrics and Gynecology

## 2016-11-07 ENCOUNTER — Encounter: Payer: Self-pay | Admitting: Family Medicine

## 2016-11-07 ENCOUNTER — Other Ambulatory Visit: Payer: Self-pay

## 2016-11-07 ENCOUNTER — Other Ambulatory Visit: Payer: Self-pay | Admitting: Family Medicine

## 2016-11-07 ENCOUNTER — Other Ambulatory Visit: Payer: Self-pay | Admitting: Obstetrics and Gynecology

## 2016-11-07 NOTE — Telephone Encounter (Signed)
Pt left v/m at 2:46 pm today requesting refill for [facility] pill; trinessa was denied by Dr Jolayne Panther who was last provider that refilled med. Pt missed pill yesterday and must have refill today. Pt last pap by OB GYN on 05/09/16. I called and spoke to pt and she said Dr Para March is supposed to fill her BC pill. Pt said it is not acceptable for pt to be without her BC Pill. Pt will ck with [facility] later this evening. Pt had annual with Dr Para March on 02/09/16.Please advise.

## 2016-11-08 MED ORDER — NORGESTIM-ETH ESTRAD TRIPHASIC 0.18/0.215/0.25 MG-35 MCG PO TABS: 1.0000 | ORAL_TABLET | Freq: Every day | ORAL | 5 refills | Status: DC

## 2016-11-08 NOTE — Telephone Encounter (Signed)
I need input from OBGYN on this, for clarity.  Dr. Jolayne Panther and Dr. Macon Large: is there a reason she can't get this filled?  Does the rx need to come through your office? I can fill if needed but I wanted to know your preference.    Many thanks.  GSD.   [place]- notify pt I am checking with ob gyn.  I didn't fill yet.  We'll work on this when I have more info.  I reserve the right to check and fill the rx correctly, especially since I didn't get this request until yesterday afternoon.

## 2016-11-08 NOTE — Telephone Encounter (Signed)
Looks like this was handled by obgyn.  I am not the rx'ing MD.  It is up to her to give the rx'ing MD time to handle the refills in the future.

## 2016-12-19 ENCOUNTER — Encounter: Payer: Self-pay | Admitting: Family Medicine

## 2016-12-20 ENCOUNTER — Ambulatory Visit: Payer: Self-pay | Admitting: *Deleted

## 2016-12-20 ENCOUNTER — Encounter: Payer: Self-pay | Admitting: Family Medicine

## 2016-12-20 NOTE — Telephone Encounter (Signed)
Spoke to patient and was advised that she is doing better now. Patient stated that she took some Zantac last night, but it did not help.  Patient stated that the pain lasted about 4 hours and then it got better,  Patient stated that when she woke up this morning most of the symptoms were gone, but has not eaten today. Advised patient that she should be careful on what she eats and try to stick to a bland diet. Advised patient that if symptoms return before her appointment tomorrow to go to the ER.

## 2016-12-20 NOTE — Telephone Encounter (Signed)
Patient took husband's Zantac without relief of symptoms- this concerned her. Reason for Disposition . [1] Chest pain lasting <= 5 minutes AND [2] NO chest pain or cardiac symptoms now(Exceptions: pains lasting a few seconds)  Answer Assessment - Initial Assessment Questions 1. LOCATION: "Where does it hurt?"       Chest pain,back pain, jaw pain on left side 2. RADIATION: "Does the pain go anywhere else?" (e.g., into neck, jaw, arms, back)     Radiated to upper back and jaw- patient states she had swelling in chin area- cyst like area. Bump on scalp swelled as well- pressure  3. ONSET: "When did the chest pain begin?" (Minutes, hours or days)      Yesterday- 9:30-10        Lasted until 2 am 4. PATTERN "Does the pain come and go, or has it been constant since it started?"  "Does it get worse with exertion?"      Today patient feels tired- she has rested- feels things have "dialed down" 5. DURATION: "How long does it last" (e.g., seconds, minutes, hours)     4 hours 6. SEVERITY: "How bad is the pain?"  (e.g., Scale 1-10; mild, moderate, or severe)    - MILD (1-3): doesn't interfere with normal activities     - MODERATE (4-7): interferes with normal activities or awakens from sleep    - SEVERE (8-10): excruciating pain, unable to do any normal activities       6 7. CARDIAC RISK FACTORS: "Do you have any history of heart problems or risk factors for heart disease?" (e.g., prior heart attack, angina; high blood pressure, diabetes, being overweight, high cholesterol, smoking, or strong family history of heart disease)     Overweight, BP borderline at office, high cholesterol 8. PULMONARY RISK FACTORS: "Do you have any history of lung disease?"  (e.g., blood clots in lung, asthma, emphysema, birth control pills)     no 9. CAUSE: "What do you think is causing the chest pain?"     Onions on hamburger 10. OTHER SYMPTOMS: "Do you have any other symptoms?" (e.g., dizziness, nausea, vomiting, sweating,  fever, difficulty breathing, cough)       Nausea, cough 11. PREGNANCY: "Is there any chance you are pregnant?" "When was your last menstrual period?"       OCP  Protocols used: CHEST PAIN-A-AH

## 2016-12-21 ENCOUNTER — Encounter: Payer: Self-pay | Admitting: *Deleted

## 2016-12-21 ENCOUNTER — Ambulatory Visit: Payer: 59 | Admitting: Family Medicine

## 2016-12-21 ENCOUNTER — Encounter: Payer: Self-pay | Admitting: Family Medicine

## 2016-12-21 ENCOUNTER — Other Ambulatory Visit: Payer: Self-pay

## 2016-12-21 VITALS — BP 110/90 | HR 94 | Temp 98.7°F | Ht 62.0 in | Wt 249.5 lb

## 2016-12-21 DIAGNOSIS — R0789 Other chest pain: Secondary | ICD-10-CM | POA: Diagnosis not present

## 2016-12-21 DIAGNOSIS — Z6379 Other stressful life events affecting family and household: Secondary | ICD-10-CM

## 2016-12-21 LAB — CBC WITH DIFFERENTIAL/PLATELET
Basophils Absolute: 0 10*3/uL (ref 0.0–0.1)
Basophils Relative: 0.2 % (ref 0.0–3.0)
EOS ABS: 0.1 10*3/uL (ref 0.0–0.7)
Eosinophils Relative: 0.7 % (ref 0.0–5.0)
HCT: 39 % (ref 36.0–46.0)
Hemoglobin: 12.6 g/dL (ref 12.0–15.0)
LYMPHS ABS: 1.5 10*3/uL (ref 0.7–4.0)
Lymphocytes Relative: 18.2 % (ref 12.0–46.0)
MCHC: 32.4 g/dL (ref 30.0–36.0)
MCV: 88.9 fl (ref 78.0–100.0)
Monocytes Absolute: 0.4 10*3/uL (ref 0.1–1.0)
Monocytes Relative: 4.7 % (ref 3.0–12.0)
NEUTROS ABS: 6.3 10*3/uL (ref 1.4–7.7)
NEUTROS PCT: 76.2 % (ref 43.0–77.0)
PLATELETS: 397 10*3/uL (ref 150.0–400.0)
RBC: 4.39 Mil/uL (ref 3.87–5.11)
RDW: 14.1 % (ref 11.5–15.5)
WBC: 8.3 10*3/uL (ref 4.0–10.5)

## 2016-12-21 LAB — TROPONIN I: TNIDX: 0 ug/l (ref 0.00–0.06)

## 2016-12-21 LAB — HEPATIC FUNCTION PANEL
ALT: 21 U/L (ref 0–35)
AST: 15 U/L (ref 0–37)
Albumin: 4.2 g/dL (ref 3.5–5.2)
Alkaline Phosphatase: 53 U/L (ref 39–117)
BILIRUBIN DIRECT: 0 mg/dL (ref 0.0–0.3)
BILIRUBIN TOTAL: 0.4 mg/dL (ref 0.2–1.2)
Total Protein: 7.3 g/dL (ref 6.0–8.3)

## 2016-12-21 LAB — BASIC METABOLIC PANEL
BUN: 11 mg/dL (ref 6–23)
CO2: 29 mEq/L (ref 19–32)
CREATININE: 0.84 mg/dL (ref 0.40–1.20)
Calcium: 9.5 mg/dL (ref 8.4–10.5)
Chloride: 104 mEq/L (ref 96–112)
GFR: 80.99 mL/min (ref >60.00)
Glucose, Bld: 105 mg/dL — ABNORMAL HIGH (ref 70–99)
POTASSIUM: 5.2 meq/L — AB (ref 3.5–5.1)
Sodium: 139 mEq/L (ref 135–145)

## 2016-12-21 MED ORDER — BUPROPION HCL ER (SR) 100 MG PO TB12: 100.0000 mg | ORAL_TABLET | Freq: Two times a day (BID) | ORAL | 0 refills | Status: DC

## 2016-12-21 NOTE — Telephone Encounter (Signed)
Agreed.  Thanks.  

## 2016-12-21 NOTE — Progress Notes (Signed)
Dr. Karleen HampshireSpencer T. Rinda Rollyson, MD, CAQ [facility] [address] [place], [address] Phone: [phone] Fax: [phone]  12/21/2016  Patient: Jessica OpitzRebecca Beber, MRN: [id number], DOB: 05/26/1980, 36 y.o.  Primary Physician:  Joaquim Namuncan, Graham S, MD   Chief Complaint  Patient presents with  . Gastroesophageal Reflux     with jaw pain-Concerned about heart issues  . Facial Swelling  . Bump on Head    Tender   Subjective:   Jessica OpitzRebecca Bolyard is a 36 y.o. very pleasant female patient who presents with the following:  Monday night had some indigestion and then got some jaw pain. Did not go away for several hours.  She had symptoms for about 4 hours.  This was not relieved after she took some Zantac, and she became concerned.  She does have a very strong family history of coronary disease and other cardiac issues.  She had pain in her chest in the substernal region as well as some get Pain radiated to the jaw.  She also had some bumps or acne on her face that were swollen some degree.  Also had a bump on the posterior aspect of her and that was also tender.  CAD in the family.  Father, in his 3350's MGF, mi at 2452  Cardiac risk factors.  Hyperlipidemia Body mass index is 45.63 kg/m.  No smoker.   Past Medical History, Surgical History, Social History, Family History, Problem List, Medications, and Allergies have been reviewed and updated if relevant.  Patient Active Problem List   Diagnosis Date Noted  . Encounter for general adult medical examination with abnormal findings 02/11/2016  . Elevated BP without diagnosis of hypertension 02/11/2016  . Hyperglycemia 02/11/2016  . Hyperlipidemia 02/11/2016  . Grief 02/15/2014  . PCOS (polycystic ovarian syndrome) 09/11/2013  . Stress due to illness of family member 09/11/2013    Past Medical History:  Diagnosis Date  . Abnormal Pap smear    repeat WNL  . Depression   . GERD (gastroesophageal  reflux disease)   . History of PCOS     Past Surgical History:  Procedure Laterality Date  . CESAREAN SECTION  02/25/2011   Procedure: CESAREAN SECTION;  Surgeon: Leslie AndreaJames E Tomblin II, MD;  Location: [facility];  Service: Gynecology;  Laterality: N/A;  . CHOLECYSTECTOMY      Social History   Socioeconomic History  . Marital status: Married    Spouse name: Not on file  . Number of children: Not on file  . Years of education: Not on file  . Highest education level: Not on file  Social Needs  . Financial resource strain: Not on file  . Food insecurity - worry: Not on file  . Food insecurity - inability: Not on file  . Transportation needs - medical: Not on file  . Transportation needs - non-medical: Not on file  Occupational History  . Not on file  Tobacco Use  . Smoking status: Never Smoker  . Smokeless tobacco: Never Used  Substance and Sexual Activity  . Alcohol use: Yes    Comment: rare  . Drug use: No  . Sexual activity: Yes    Birth control/protection: Pill  Other Topics Concern  . Not on file  Social History Narrative   Married 2010   1 daughter   Stay at home mom    Family History  Problem Relation Age of Onset  . Bladder Cancer Mother   . COPD Mother   .  Cancer Mother        bladder  . Bladder Cancer Father   . Cancer Father        bladder  . Breast cancer Paternal Grandmother   . Anesthesia problems Neg Hx   . Colon cancer Neg Hx     Allergies  Allergen Reactions  . Lexapro [Escitalopram Oxalate] Other (See Comments)    Blunted affect    Medication list reviewed and updated in full in [facility].   GEN: No acute illnesses, no fevers, chills. GI: No n/v/d, eating normally Pulm: No SOB Interactive and getting along well at home.  Otherwise, ROS is as per the HPI.  Objective:   BP 110/90   Pulse 94   Temp 98.7 F (37.1 C) (Oral)   Ht 5\' 2"  (1.575 m)   Wt 249 lb 8 oz (113.2 kg)   LMP 11/03/2016   BMI 45.63 kg/m   GEN: WDWN, NAD,  Non-toxic, A & O x 3 HEENT: Atraumatic, Normocephalic. Neck supple. No masses, No LAD. Ears and Nose: No external deformity. CV: RRR, No M/G/R. No JVD. No thrill. No extra heart sounds. PULM: CTA B, no wheezes, crackles, rhonchi. No retractions. No resp. distress. No accessory muscle use. EXTR: No c/c/e NEURO Normal gait.  PSYCH: Normally interactive. Conversant. Not depressed or anxious appearing.  Calm demeanor.   Laboratory and Imaging Data:  Assessment and Plan:   Other chest pain - Plan: EKG 12-Lead, Troponin I, CBC with Differential/Platelet, Basic metabolic panel, Hepatic function panel  Stress due to illness of family member  EKG: Normal sinus rhythm. Normal axis, normal R wave progression, No acute ST elevation or depression.   We will go ahead and check a troponin as well as some other basic labs.  The patient does not have any chest pain right now.  Both EKG and troponin are negative, I think that likely this is a noncardiac source of chest pain.  If she has recurrence of symptoms at all, then further evaluation should be indicated.  She also wants to titrate off of her Wellbutrin, so I am going to decrease her to 100 mg twice daily, then stop after 2 weeks.  Follow-up: No Follow-up on file.  No future appointments.  Meds ordered this encounter  Medications  . buPROPion (WELLBUTRIN SR) 100 MG 12 hr tablet    Sig: Take 1 tablet (100 mg total) by mouth 2 (two) times daily.    Dispense:  60 tablet    Refill:  0   Medications Discontinued During This Encounter  Medication Reason  . buPROPion (WELLBUTRIN SR) 200 MG 12 hr tablet    Orders Placed This Encounter  Procedures  . Troponin I  . CBC with Differential/Platelet  . Basic metabolic panel  . Hepatic function panel  . EKG 12-Lead    Signed,  Ameera Tigue T. Quanell Loughney, MD   Allergies as of 12/21/2016      Reactions   Lexapro [escitalopram Oxalate] Other (See Comments)   Blunted affect      Medication List          Accurate as of 12/21/16 11:16 AM. Always use your most recent med list.          buPROPion 100 MG 12 hr tablet Commonly known as:  WELLBUTRIN SR Take 1 tablet (100 mg total) by mouth 2 (two) times daily.   Cholecalciferol 1000 units capsule Commonly known as:  [employer]S VITAMIN D3 Take 2 capsules (2,000 Units total) by mouth  daily.   Norgestimate-Ethinyl Estradiol Triphasic 0.18/0.215/0.25 MG-35 MCG tablet Commonly known as:  TRINESSA (28) Take 1 tablet by mouth daily.   ondansetron 8 MG disintegrating tablet Commonly known as:  ZOFRAN ODT Take 1 tablet (8 mg total) by mouth every 8 (eight) hours as needed for nausea or vomiting.

## 2017-01-11 ENCOUNTER — Encounter: Payer: Self-pay | Admitting: Family Medicine

## 2017-03-27 ENCOUNTER — Encounter: Payer: Self-pay | Admitting: Radiology

## 2017-04-05 ENCOUNTER — Encounter: Payer: Self-pay | Admitting: Family Medicine

## 2017-04-07 ENCOUNTER — Telehealth: Payer: Self-pay

## 2017-04-07 NOTE — Telephone Encounter (Signed)
Irving Burtonmily at front desk said pt had scheduled mychart CPX on 04/13/17 but mentioned possible angina pain and I was asked to triage pt.per pts recent email;Pt is concerned possible angina with following symptoms; low back pain, ache at core of body and lower lt jaw pain, Pt last seen 12/21/16 and cardiac enzymes looked normal. Unable to reach pt or her husband by any contact #. I spoke with Lanora ManisElizabeth, pts sister and she will have pt call [facility].

## 2017-04-07 NOTE — Telephone Encounter (Signed)
Noted. Thanks. Will see at OV.  

## 2017-04-07 NOTE — Telephone Encounter (Signed)
Pt has not called back and still unable to reach pt by phone.

## 2017-04-07 NOTE — Telephone Encounter (Signed)
I spoke with pt and no pain now; last lower back pain,core pain under rib cage in center went to shoulder on lt side and lt lower jaw pain was on 04/01/17. Pt had same previous symptoms in the past and pt thought was gas. Prior to pain on Sat pt had mopped floors at her home and went on errand. Pt is also recovering from cold symptoms. Dr Para Marchuncan advised pt can keep 04/13/17 appt but if develops any pain or symptoms prior to appt to go to ED. Pt voiced understanding and promised she would follow Dr Armanda Heritageuncans advice. FYI to Dr Para Marchuncan.

## 2017-04-13 ENCOUNTER — Encounter: Payer: Self-pay | Admitting: Family Medicine

## 2017-04-13 ENCOUNTER — Ambulatory Visit: Payer: 59 | Admitting: Family Medicine

## 2017-04-13 VITALS — BP 152/92 | HR 109 | Temp 98.8°F | Wt 251.5 lb

## 2017-04-13 DIAGNOSIS — R079 Chest pain, unspecified: Secondary | ICD-10-CM | POA: Diagnosis not present

## 2017-04-13 NOTE — Progress Notes (Signed)
Patient has stressors with caring for her mother, who is chronically ill.  She has a supportive husband.  D/w pt.  She is trying to work through the situation.    D/w pt about wellbutrin taper.  She was able to taper off the medicine and she felt better off med, with less when nausea off the medicine.  Mood is okay in the meantime.    She is off OCPs in the meantime.  Husband is checking on vasectomy options.  She changed over to turmeric with some relief of hip pain.  She was asking about trying a calcium/magnesium supplement; this may be okay to use.  D/w pt.    She had one episode where she got panicked and had central chest pain, to the L shoulder and then the L jaw.  No CP now.  No exertional chest pain.  She is able to get up and walk w/o sx.   Meds, vitals, and allergies reviewed.   ROS: Per HPI unless specifically indicated in ROS section   GEN: nad, alert and oriented HEENT: mucous membranes moist NECK: supple w/o LA CV: rrr PULM: ctab, no inc wob ABD: soft, +bs EXT: no edema  EKG d/w pt.  Sinus tach with shorter PR.

## 2017-04-13 NOTE — Patient Instructions (Signed)
Add in some cardio.  If your pulse if consistently >100 at rest then let me know.  Take care.  Glad to see you.  Update me as needed .

## 2017-04-14 DIAGNOSIS — R079 Chest pain, unspecified: Secondary | ICD-10-CM | POA: Insufficient documentation

## 2017-04-14 NOTE — Assessment & Plan Note (Addendum)
I would presume that at least some of her symptoms are related to chronic stressors related to caring for her mother.  Discussed with patient about options.  She is trying to make arrangements at home to make the situation more tolerable.  No suicidal or homicidal intent.  Her husband is supportive.  She does have an elevated resting heart rate, at the upper end of normal.  She has a mildly short PR interval.  She does not have runs of significant tachypalpitations that she has noted.  Discussed with patient about options.  Gradual increase in cardiovascular exercise would be beneficial for multiple reasons.  I want her to monitor her pulse in the meantime.  If she has persistent tachycardia then beta-blocker at a low dose may be reasonable.  Discussed with patient.  She will monitor her pulse and update me.  Her EKG is otherwise unremarkable without acute changes and there is no reason to think that she had an acute coronary event that was causing the pain that she had.  It could have been a combination of panic/reflux, but I suspect it is noncardiac.  She does not have exertional symptoms in the meantime.  At this point okay for outpatient follow-up. >25 minutes spent in face to face time with patient, >50% spent in counselling or coordination of care.

## 2017-09-14 ENCOUNTER — Encounter: Payer: Self-pay | Admitting: Family Medicine

## 2017-09-17 ENCOUNTER — Other Ambulatory Visit: Payer: Self-pay | Admitting: Family Medicine

## 2017-09-17 DIAGNOSIS — G4730 Sleep apnea, unspecified: Secondary | ICD-10-CM

## 2017-10-02 ENCOUNTER — Ambulatory Visit (INDEPENDENT_AMBULATORY_CARE_PROVIDER_SITE_OTHER): Payer: 59 | Admitting: Internal Medicine

## 2017-10-02 ENCOUNTER — Encounter: Payer: Self-pay | Admitting: Internal Medicine

## 2017-10-02 VITALS — BP 118/86 | HR 88 | Resp 16 | Ht 62.0 in | Wt 255.0 lb

## 2017-10-02 DIAGNOSIS — R400 Somnolence: Secondary | ICD-10-CM

## 2017-10-02 NOTE — Patient Instructions (Signed)

## 2017-10-02 NOTE — Progress Notes (Signed)
[facility] Medicine Consultation      Assessment and Plan:  Excessive daytime sleepiness. - Symptoms and signs of obstructive sleep apnea -We will send for sleep study.  Orders Placed This Encounter  Procedures  . Home sleep test   Return in about 3 months (around 01/01/2018).    Date: 10/02/2017  MRN# [id number] Jessica Barron [birth year]    Jessica Barron is a 37 y.o. old female seen in consultation for chief complaint of:    Chief Complaint  Patient presents with  . Consult    referred by Crawford Givens for eval of OSA:    HPI:   The patient is a 37 year old female, who was seen with complaints of loud snoring, excessive daytime sleepiness.  She typically goes to bed at 10 PM, falls asleep within an hour.  She usually gets out of bed at 8 AM.  She has had previous sleep studies in [facility] in [place], she was last on CPAP about 10 years ago but then stopped after a brief amount of time. Her father had severe OSA. She was still snoring with it on and it was waking her up.  When she wakes in the am she feels very tired.   She has had sleep paralysis episodes. No sleep walking. Denies jaw pain, denies TMJ.  Patient notes occasional cough, though this is not bothersome.  She has significant secondhand smoke exposure from her mother who is a heavy smoker.   PMHX:   Past Medical History:  Diagnosis Date  . Abnormal Pap smear    repeat WNL  . Depression   . GERD (gastroesophageal reflux disease)   . History of PCOS    Surgical Hx:  Past Surgical History:  Procedure Laterality Date  . CESAREAN SECTION  02/25/2011   Procedure: CESAREAN SECTION;  Surgeon: Leslie Andrea, MD;  Location: [facility];  Service: Gynecology;  Laterality: N/A;  . CHOLECYSTECTOMY     Family Hx:  Family History  Problem Relation Age of Onset  . Bladder Cancer Mother   . COPD Mother   . Cancer Mother        bladder  . Bladder Cancer Father   . Cancer Father        bladder    . Breast cancer Paternal Grandmother   . Anesthesia problems Neg Hx   . Colon cancer Neg Hx    Social Hx:   Social History   Tobacco Use  . Smoking status: Never Smoker  . Smokeless tobacco: Never Used  Substance Use Topics  . Alcohol use: Yes    Comment: rare  . Drug use: No   Medication:    Current Outpatient Medications:  .  Cholecalciferol (VITAMIN D3) 5000 units TABS, Take 5,000 Units by mouth., Disp: , Rfl:  .  Magnesium Gluconate (MAGNESIUM 27) 500 (27 Mg) MG TABS, Take 1 tablet by mouth daily., Disp: , Rfl:  .  TURMERIC PO, Take by mouth. Taking daily., Disp: , Rfl:    Allergies:  Lexapro [escitalopram oxalate] and Wellbutrin [bupropion]  Review of Systems: Gen:  Denies  fever, sweats, chills HEENT: Denies blurred vision, double vision. bleeds, sore throat Cvc:  No dizziness, chest pain. Resp:   Denies cough or sputum production, shortness of breath Gi: Denies swallowing difficulty, stomach pain. Gu:  Denies bladder incontinence, burning urine Ext:   No Joint pain, stiffness. Skin: No skin rash,  hives  Endoc:  No polyuria, polydipsia. Psych: No depression, insomnia. Other:  All other systems were reviewed with the patient and were negative other that what is mentioned in the HPI.   Physical Examination:   VS: BP 118/86 (BP Location: Left Arm, Cuff Size: Large)   Pulse 88   Resp 16   Ht 5\' 2"  (1.575 m)   Wt 255 lb (115.7 kg)   SpO2 100%   BMI 46.64 kg/m   General Appearance: No distress  Neuro:without focal findings,  speech normal,  HEENT: PERRLA, EOM intact.   Pulmonary: normal breath sounds, No wheezing.  CardiovascularNormal S1,S2.  No m/r/g.   Abdomen: Benign, Soft, non-tender. Renal:  No costovertebral tenderness  GU:  No performed at this time. Endoc: No evident thyromegaly, no signs of acromegaly. Skin:   warm, no rashes, no ecchymosis  Extremities: normal, no cyanosis, clubbing.  Other findings:    LABORATORY PANEL:   CBC No  results for input(s): WBC, HGB, HCT, PLT in the last 168 hours. ------------------------------------------------------------------------------------------------------------------  Chemistries  No results for input(s): NA, K, CL, CO2, GLUCOSE, BUN, CREATININE, CALCIUM, MG, AST, ALT, ALKPHOS, BILITOT in the last 168 hours.  Invalid input(s): GFRCGP ------------------------------------------------------------------------------------------------------------------  Cardiac Enzymes No results for input(s): TROPONINI in the last 168 hours. ------------------------------------------------------------  RADIOLOGY:  No results found.     Thank  you for the consultation and for allowing [facility] to assist in the care of your patient. Our recommendations are noted above.  Please contact us if we can be of further service.   Wells Guiles, M.D., F.C.C.P.  Board Certified in Internal Medicine, Pulmonary Medicine, Critical Care Medicine, and Sleep Medicine.  [facility] Office Number: [phone]   10/02/2017

## 2017-10-03 ENCOUNTER — Telehealth: Payer: Self-pay | Admitting: Internal Medicine

## 2017-10-03 NOTE — Telephone Encounter (Signed)
Called and spoke with patient. Advised that insurance approved HST and scheduled patient to pick up the device on Friday 10/27/17 between 3:30-4:30.  Pt voiced understanding and is aware of appointment. Nothing else needed at this time. Rhonda J Cobb

## 2017-10-03 NOTE — Telephone Encounter (Signed)
Per Patient call home when ready to schedule hst

## 2017-10-27 DIAGNOSIS — G4733 Obstructive sleep apnea (adult) (pediatric): Secondary | ICD-10-CM | POA: Diagnosis not present

## 2017-10-28 ENCOUNTER — Encounter: Payer: Self-pay | Admitting: Internal Medicine

## 2017-10-28 DIAGNOSIS — R400 Somnolence: Secondary | ICD-10-CM

## 2017-10-30 ENCOUNTER — Telehealth: Payer: Self-pay | Admitting: *Deleted

## 2017-10-30 DIAGNOSIS — G4733 Obstructive sleep apnea (adult) (pediatric): Secondary | ICD-10-CM | POA: Diagnosis not present

## 2017-10-30 NOTE — Telephone Encounter (Signed)
Pt aware of result Orders placed Nothing further needed.

## 2018-02-09 ENCOUNTER — Encounter: Payer: Self-pay | Admitting: Family Medicine

## 2018-02-09 NOTE — Telephone Encounter (Addendum)
Patient called to follow-up on the my chart message that she sent you. Patient stated that something is not right. Patient sated that she is still having some abdominal discomfort. Patient stated that her bottom lip is tingling and twitching off and on for several days.. Patient stated that she is having some weird pain throw out her torso.

## 2018-02-16 ENCOUNTER — Encounter: Payer: Self-pay | Admitting: Family Medicine

## 2018-02-16 ENCOUNTER — Ambulatory Visit (INDEPENDENT_AMBULATORY_CARE_PROVIDER_SITE_OTHER): Payer: 59 | Admitting: Family Medicine

## 2018-02-16 VITALS — BP 124/88 | HR 92 | Temp 98.8°F | Ht 62.0 in | Wt 257.0 lb

## 2018-02-16 DIAGNOSIS — Z8052 Family history of malignant neoplasm of bladder: Secondary | ICD-10-CM | POA: Diagnosis not present

## 2018-02-16 DIAGNOSIS — Z0000 Encounter for general adult medical examination without abnormal findings: Secondary | ICD-10-CM

## 2018-02-16 DIAGNOSIS — K5900 Constipation, unspecified: Secondary | ICD-10-CM

## 2018-02-16 DIAGNOSIS — R739 Hyperglycemia, unspecified: Secondary | ICD-10-CM

## 2018-02-16 DIAGNOSIS — E785 Hyperlipidemia, unspecified: Secondary | ICD-10-CM

## 2018-02-16 LAB — GLUCOSE, RANDOM: Glucose, Bld: 88 mg/dL (ref 65–99)

## 2018-02-16 LAB — LIPID PANEL
CHOL/HDL RATIO: 5 (calc) — AB (ref <5.0)
Cholesterol: 204 mg/dL — ABNORMAL HIGH (ref <200)
HDL: 41 mg/dL — AB (ref >50)
LDL Cholesterol (Calc): 140 mg/dL (calc) — ABNORMAL HIGH
NON-HDL CHOLESTEROL (CALC): 163 mg/dL — AB (ref <130)
TRIGLYCERIDES: 112 mg/dL (ref <150)

## 2018-02-16 NOTE — Progress Notes (Signed)
CPE- See plan.  Routine anticipatory guidance given to patient.  See health maintenance.  The possibility exists that previously documented standard health maintenance information may have been brought forward from a previous encounter into this note.  If needed, that same information has been updated to reflect the current situation based on today's encounter.    Tetanus 2013 Flu encouraged.  Declined.  PNA not due Shingles not due Pap 2018 Mammogram not due DXA not due Colon cancer screening not due.   Living will d/w pt.  Husband designated if patient were incapacitated.   Diet and exercise d/w pt.    She is still caring for her mother.  D/w pt. this is a clear social stressor.  The patient is trying to decide about options, in terms of long-term care for her mother.  Still okay for outpatient follow-up.  She clearly wasn't feeling well last week with nausea.  She had a large BM last week and felt better after that.  Her child had similar sx, with resolution.    She didn't have tongue swelling.  She had some tingling on her lower lip but that resolved in the meantime.  This may have been incidental to the other sx.    FH bladder cancer.  No gross hematuria.  D/w pt about urology referral.    PMH and SH reviewed  Meds, vitals, and allergies reviewed.   ROS: Per HPI.  Unless specifically indicated otherwise in HPI, the patient denies:  General: fever. Eyes: acute vision changes ENT: sore throat Cardiovascular: chest pain Respiratory: SOB GI: vomiting GU: dysuria Musculoskeletal: acute back pain Derm: acute rash Neuro: acute motor dysfunction Psych: worsening mood Endocrine: polydipsia Heme: bleeding Allergy: hayfever  GEN: nad, alert and oriented HEENT: mucous membranes moist NECK: supple w/o LA CV: rrr. PULM: ctab, no inc wob ABD: soft, +bs EXT: no edema SKIN: no acute rash

## 2018-02-16 NOTE — Patient Instructions (Addendum)
We make arrangements for referrals, extra imaging, and other appointments based on the urgency of the situation. Referrals are handled based on the clinical situation, not in the order that they are placed. If you do not see one of our referral coordinators on the way out of the clinic today, then you should expect a call in the next 1 to 2 weeks. We work diligently to process all referrals as quickly as possible.    Go to the lab on the way out.  We'll contact you with your lab report. Take care.  Glad to see you.

## 2018-02-16 NOTE — Addendum Note (Signed)
Addended by: Kyrollos Cordell on: 02/16/2018 03:21 PM   Modules accepted: Orders  

## 2018-02-16 NOTE — Addendum Note (Signed)
Addended by: Wendi Maya on: 02/16/2018 03:21 PM   Modules accepted: Orders

## 2018-02-18 DIAGNOSIS — K5900 Constipation, unspecified: Secondary | ICD-10-CM | POA: Insufficient documentation

## 2018-02-18 DIAGNOSIS — Z8052 Family history of malignant neoplasm of bladder: Secondary | ICD-10-CM

## 2018-02-18 HISTORY — DX: Constipation, unspecified: K59.00

## 2018-02-18 HISTORY — DX: Family history of malignant neoplasm of bladder: Z80.52

## 2018-02-18 NOTE — Assessment & Plan Note (Signed)
Both parents had bladder cancer.  She has no known urinary symptoms at this point.  Refer to urology.  We did not check her urine today since we were going to refer her if she had a normal or abnormal urine sample.  She agrees.

## 2018-02-18 NOTE — Assessment & Plan Note (Signed)
Tetanus 2013 Flu encouraged.  Declined.  PNA not due Shingles not due Pap 2018 Mammogram not due DXA not due Colon cancer screening not due.   Living will d/w pt.  Husband designated if patient were incapacitated.   Diet and exercise d/w pt.    See notes on labs.  She is still caring for her mother.  D/w pt. this is a clear social stressor.  The patient is trying to decide about options, in terms of long-term care for her mother.  Still okay for outpatient follow-up.

## 2018-02-18 NOTE — Assessment & Plan Note (Signed)
She feels better in the meantime after significantly large recent bowel movement.  Discussed diet.  Monitor for now.  She agrees.

## 2018-03-12 ENCOUNTER — Encounter: Payer: Self-pay | Admitting: Family Medicine

## 2018-03-14 ENCOUNTER — Encounter: Payer: Self-pay | Admitting: Family Medicine

## 2018-03-27 ENCOUNTER — Ambulatory Visit: Payer: Self-pay | Admitting: Urology

## 2018-04-11 ENCOUNTER — Ambulatory Visit: Payer: Self-pay | Admitting: Urology

## 2018-05-25 ENCOUNTER — Encounter: Payer: Self-pay | Admitting: Family Medicine

## 2018-06-03 ENCOUNTER — Encounter: Payer: Self-pay | Admitting: Family Medicine

## 2018-06-04 ENCOUNTER — Ambulatory Visit (INDEPENDENT_AMBULATORY_CARE_PROVIDER_SITE_OTHER): Payer: 59 | Admitting: Family Medicine

## 2018-06-04 ENCOUNTER — Encounter: Payer: Self-pay | Admitting: Family Medicine

## 2018-06-04 DIAGNOSIS — R300 Dysuria: Secondary | ICD-10-CM | POA: Diagnosis not present

## 2018-06-04 LAB — POC URINALSYSI DIPSTICK (AUTOMATED)
Bilirubin, UA: NEGATIVE
Blood, UA: NEGATIVE
Glucose, UA: NEGATIVE
Ketones, UA: NEGATIVE
Nitrite, UA: NEGATIVE
Protein, UA: NEGATIVE
Spec Grav, UA: >=1.03 — AB (ref 1.010–1.025)
Urobilinogen, UA: 0.2 E.U./dL
pH, UA: 6 (ref 5.0–8.0)

## 2018-06-04 MED ORDER — SULFAMETHOXAZOLE-TRIMETHOPRIM 400-80 MG PO TABS: 1.0000 | ORAL_TABLET | Freq: Two times a day (BID) | ORAL | 0 refills | Status: DC

## 2018-06-04 NOTE — Telephone Encounter (Signed)
Spoke to pt. Made Doxy appt at 10am. She is coming to get UA testing supplies prior.

## 2018-06-04 NOTE — Progress Notes (Signed)
Virtual visit completed through WebEx or similar program Patient location: home  Provider location: [place] at [facility], office   Limitations and rationale for visit method d/w patient.  Patient agreed to proceed.   CC: dysuria.   HPI:  duration of symptoms: about 3-4 days ago.  Noted crampy lower abd pain, pain with urination, change in urine odor.   abdominal pain: yes, see above.   Fevers:no known.  back pain: she has back pain at baseline.   Vomiting:no U/a with LE noted.  Some nausea.    Meds and allergies reviewed.   ROS: Per HPI unless specifically indicated in ROS section   NAD Speech wnl  A/P: dysuria.  U/a d/w pt. Presumed cystitis.   Start septra and continue PO fluids.   ucx pending.   Okay for outpatient f/u.  She'll update me as needed.

## 2018-06-04 NOTE — Assessment & Plan Note (Signed)
U/a d/w pt. Presumed cystitis.   Start septra and continue PO fluids.   ucx pending.   Okay for outpatient f/u.  She'll update me as needed.  She agrees.

## 2018-06-05 LAB — URINE CULTURE
MICRO NUMBER:: 462446
SPECIMEN QUALITY:: ADEQUATE

## 2018-06-15 ENCOUNTER — Telehealth: Payer: Self-pay

## 2018-06-15 NOTE — Telephone Encounter (Signed)
Left message for patient to call back about results and update

## 2018-08-14 ENCOUNTER — Encounter: Payer: Self-pay | Admitting: Family Medicine

## 2018-09-24 ENCOUNTER — Encounter: Payer: Self-pay | Admitting: Family Medicine

## 2018-10-23 ENCOUNTER — Ambulatory Visit (INDEPENDENT_AMBULATORY_CARE_PROVIDER_SITE_OTHER): Payer: 59 | Admitting: Family Medicine

## 2018-10-23 ENCOUNTER — Encounter: Payer: Self-pay | Admitting: Family Medicine

## 2018-10-23 DIAGNOSIS — Z7189 Other specified counseling: Secondary | ICD-10-CM

## 2018-10-23 DIAGNOSIS — F439 Reaction to severe stress, unspecified: Secondary | ICD-10-CM
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
## 2018-10-24 ENCOUNTER — Other Ambulatory Visit: Payer: Self-pay | Admitting: Family Medicine

## 2018-10-24 ENCOUNTER — Encounter: Payer: Self-pay | Admitting: Family Medicine

## 2018-10-24 ENCOUNTER — Telehealth: Payer: Self-pay | Admitting: Family Medicine

## 2018-10-24 DIAGNOSIS — Z7189 Other specified counseling: Secondary | ICD-10-CM | POA: Insufficient documentation

## 2018-10-24 DIAGNOSIS — T7431XA Adult psychological abuse, confirmed, initial encounter: Secondary | ICD-10-CM | POA: Insufficient documentation

## 2018-10-24 DIAGNOSIS — Z659 Problem related to unspecified psychosocial circumstances: Secondary | ICD-10-CM | POA: Insufficient documentation

## 2018-10-24 DIAGNOSIS — F439 Reaction to severe stress, unspecified: Secondary | ICD-10-CM
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
## 2018-10-24 NOTE — Assessment & Plan Note (Signed)
  Advanced directive discussed with patient.  If patient is incapacitated she wants her sister designated.
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
## 2018-10-25 NOTE — Telephone Encounter (Signed)
Patient advised.

## 2018-10-30 ENCOUNTER — Encounter: Payer: Self-pay | Admitting: Family Medicine

## 2018-10-31 ENCOUNTER — Encounter: Payer: Self-pay | Admitting: Family Medicine

## 2018-11-01 ENCOUNTER — Ambulatory Visit (INDEPENDENT_AMBULATORY_CARE_PROVIDER_SITE_OTHER): Payer: 59 | Admitting: Family Medicine

## 2018-11-01 ENCOUNTER — Telehealth: Payer: Self-pay | Admitting: Family Medicine

## 2018-11-01 ENCOUNTER — Encounter: Payer: Self-pay | Admitting: Family Medicine

## 2018-11-01 VITALS — BP 129/89 | HR 86 | Temp 97.5°F | Ht 62.0 in | Wt 246.2 lb

## 2018-11-01 DIAGNOSIS — Z659 Problem related to unspecified psychosocial circumstances: Secondary | ICD-10-CM

## 2018-11-01 MED ORDER — VENLAFAXINE HCL ER 37.5 MG PO CP24: 37.5000 mg | ORAL_CAPSULE | Freq: Every day | ORAL | 2 refills | Status: DC

## 2018-11-01 NOTE — Progress Notes (Signed)
Interactive audio and video telecommunications were attempted between this provider and patient, however failed, due to patient having technical difficulties OR patient did not have access to video capability.  We continued and completed visit with audio only.   Virtual Visit via Telephone Note  I connected with patient on 11/01/18  at 3:13 PM  by telephone and verified that I am speaking with the correct person using two identifiers.  Location of patient: home  Location of MD: [place] Name of referring provider (if blank then none associated): Names per persons and role in encounter:  MD: Earlyne Iba, Patient: name listed above.    I discussed the limitations, risks, security and privacy concerns of performing an evaluation and management service by telephone and the availability of in person appointments. I also discussed with the patient that there may be a patient responsible charge related to this service. The patient expressed understanding and agreed to proceed.  Cc F/u  History of Present Illness: Discuss stressors at home.  She separated from husband.  Per patient report, safe at home.  She is caring for her daughter and for her mother.  Routine cautions discussed.  No suicidal or homicidal intent.  She is trying to get set up with counseling.  She was asking about medication options.  She was specifically asking about Celexa.  We talked about her previous relative intolerance with Lexapro.  We talked about previous intolerance of Wellbutrin.  Rationale for treatment and options discussed.  Observations/Objective: nad Speech normal.  Assessment and Plan: Other social stressor.  Discussed options.  Reasonable to try effexor and gradually inc dose if needed.  She can start with 37.5 mg a day and then increase to 2 tablets in the morning after 1 week if tolerated and if needed.  No suicidal homicidal intent.  Okay for outpatient follow-up.  She is going to follow-up with  counseling.  She will let me know how she is doing in the meantime.   Follow Up Instructions: see above.    I discussed the assessment and treatment plan with the patient. The patient was provided an opportunity to ask questions and all were answered. The patient agreed with the plan and demonstrated an understanding of the instructions.   The patient was advised to call back or seek an in-person evaluation if the symptoms worsen or if the condition fails to improve as anticipated.  I provided 15 minutes of non-face-to-face time during this encounter.  Elsie Stain, MD

## 2018-11-01 NOTE — Telephone Encounter (Signed)
I spoke with Jessica Barron and virtual appt today at 3 pm will be OK. No SI/HI and Jessica Barron does not feel threatened. Jessica Barron will have vital signs ready for CMA when she calls but Jessica Barron cannot take a call between 12:30 - 1:30 today. FYI to Dr Damita Dunnings.
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
## 2018-11-01 NOTE — Telephone Encounter (Signed)
Noted. Thanks.

## 2018-11-04 NOTE — Assessment & Plan Note (Signed)
Discussed options.  Reasonable to try effexor and gradually inc dose if needed.  She can start with 37.5 mg a day and then increase to 2 tablets in the morning after 1 week if tolerated and if needed.  No suicidal homicidal intent.  Okay for outpatient follow-up.  She is going to follow-up with counseling.  She will let me know how she is doing in the meantime.

## 2018-11-05 ENCOUNTER — Encounter: Payer: Self-pay | Admitting: Family Medicine

## 2018-11-05 NOTE — Telephone Encounter (Signed)
Will need to send in new rx for Effexor XR 75 mg one capsule daily for insurance to cover.

## 2018-11-06 ENCOUNTER — Other Ambulatory Visit: Payer: Self-pay | Admitting: Family Medicine

## 2018-11-06 MED ORDER — VENLAFAXINE HCL ER 75 MG PO CP24: 75.0000 mg | ORAL_CAPSULE | Freq: Every day | ORAL | 1 refills | Status: DC

## 2018-11-14 ENCOUNTER — Ambulatory Visit (INDEPENDENT_AMBULATORY_CARE_PROVIDER_SITE_OTHER): Payer: 59 | Admitting: Clinical

## 2018-11-14 ENCOUNTER — Encounter: Payer: Self-pay | Admitting: Family Medicine

## 2018-11-14 DIAGNOSIS — F4323 Adjustment disorder with mixed anxiety and depressed mood: Secondary | ICD-10-CM | POA: Diagnosis not present

## 2018-11-20 ENCOUNTER — Ambulatory Visit (INDEPENDENT_AMBULATORY_CARE_PROVIDER_SITE_OTHER): Payer: 59 | Admitting: Clinical

## 2018-11-20 DIAGNOSIS — F4323 Adjustment disorder with mixed anxiety and depressed mood: Secondary | ICD-10-CM

## 2018-11-23 ENCOUNTER — Telehealth: Payer: Self-pay

## 2018-11-23 MED ORDER — VENLAFAXINE HCL ER 75 MG PO CP24: 150.0000 mg | ORAL_CAPSULE | Freq: Every day | ORAL | Status: DC

## 2018-11-23 NOTE — Telephone Encounter (Signed)
Would try taking 150mg  a day.  She may be able to tolerate it better taking 2 of the 75mg  tabs at night.  Update me in about 1 week, sooner if needed.  Thanks.

## 2018-11-23 NOTE — Telephone Encounter (Signed)
Patient called asking if maybe she can or should increase Venlafaxine dose? She is currently taking 75 mg 1 tablet daily. Patient states her depression symptoms were doing better but she is noticing that she is getting more depressed again, motivation to complete things/get things done is down. She is having the drowsiness from the medication but states this medication has worked better than others she tried and not as bad of a side effect like she had with others and rather stay on this medication but maybe increase it? Please review.

## 2018-11-23 NOTE — Telephone Encounter (Signed)
Patient advised and verbalized understanding 

## 2018-12-04 ENCOUNTER — Ambulatory Visit (INDEPENDENT_AMBULATORY_CARE_PROVIDER_SITE_OTHER): Payer: 59 | Admitting: Clinical

## 2018-12-04 DIAGNOSIS — F4323 Adjustment disorder with mixed anxiety and depressed mood: Secondary | ICD-10-CM

## 2018-12-25 ENCOUNTER — Ambulatory Visit (INDEPENDENT_AMBULATORY_CARE_PROVIDER_SITE_OTHER): Payer: 59 | Admitting: Clinical

## 2018-12-25 DIAGNOSIS — F4323 Adjustment disorder with mixed anxiety and depressed mood: Secondary | ICD-10-CM | POA: Diagnosis not present

## 2018-12-26 ENCOUNTER — Other Ambulatory Visit: Payer: Self-pay | Admitting: Family Medicine

## 2018-12-26 ENCOUNTER — Encounter: Payer: Self-pay | Admitting: Family Medicine

## 2018-12-26 MED ORDER — VENLAFAXINE HCL ER 150 MG PO CP24: 150.0000 mg | ORAL_CAPSULE | Freq: Every day | ORAL | 3 refills | Status: DC

## 2018-12-26 MED ORDER — NORGESTIM-ETH ESTRAD TRIPHASIC 0.18/0.215/0.25 MG-25 MCG PO TABS: 1.0000 | ORAL_TABLET | Freq: Every day | ORAL | 3 refills | Status: DC

## 2019-01-21 ENCOUNTER — Encounter: Payer: Self-pay | Admitting: Family Medicine

## 2019-01-23 ENCOUNTER — Other Ambulatory Visit: Payer: Self-pay | Admitting: Family Medicine

## 2019-01-23 MED ORDER — VENLAFAXINE HCL ER 75 MG PO CP24: 75.0000 mg | ORAL_CAPSULE | Freq: Every day | ORAL | 1 refills | Status: DC

## 2019-01-29 ENCOUNTER — Ambulatory Visit: Payer: 59 | Admitting: Clinical

## 2019-02-19 ENCOUNTER — Ambulatory Visit (INDEPENDENT_AMBULATORY_CARE_PROVIDER_SITE_OTHER): Payer: 59 | Admitting: Clinical

## 2019-02-19 DIAGNOSIS — F4323 Adjustment disorder with mixed anxiety and depressed mood: Secondary | ICD-10-CM | POA: Diagnosis not present

## 2019-02-22 ENCOUNTER — Encounter: Payer: Self-pay | Admitting: Family Medicine

## 2019-02-25 NOTE — Telephone Encounter (Signed)
Please call and triage/schedule patient re: breast mass.  Thanks.

## 2019-02-25 NOTE — Telephone Encounter (Signed)
I was unable to reach pt by phone and cound not leave v/m for pt to cb to [facility] because her mailbox was full.  I sent pt my chart note copied below. Chundra, Dr Para March wanted me to call you and get some more information; I tried calling you and got your v/m but could not leave a v/m because your mailbox is full. Please call [facility] at [phone] and schedule an appt to be seen in office. Thank you, Lewanda Rife LPN. FYI to R Isley LPN and L Fuquay CMA.

## 2019-02-26 NOTE — Telephone Encounter (Signed)
I tried calling pt and was unable to leave v/m due to pts mailbox being full. FYI to R Romey Cohea LPN and L Fuquay CMA.

## 2019-02-27 NOTE — Telephone Encounter (Signed)
Tried to contact pt but no answer and mailbox is still full.

## 2019-03-01 NOTE — Telephone Encounter (Signed)
I tried calling pt; mailbox is still full.

## 2019-03-04 NOTE — Telephone Encounter (Signed)
error 

## 2019-03-05 ENCOUNTER — Ambulatory Visit (INDEPENDENT_AMBULATORY_CARE_PROVIDER_SITE_OTHER): Payer: 59 | Admitting: Clinical

## 2019-03-05 DIAGNOSIS — F4323 Adjustment disorder with mixed anxiety and depressed mood: Secondary | ICD-10-CM

## 2019-03-05 NOTE — Telephone Encounter (Signed)
Pt already has appt with Dr Para March 03/07/19 at 4:15. FYI to Dr Para March.

## 2019-03-07 ENCOUNTER — Other Ambulatory Visit: Payer: Self-pay

## 2019-03-07 ENCOUNTER — Ambulatory Visit (INDEPENDENT_AMBULATORY_CARE_PROVIDER_SITE_OTHER): Payer: 59 | Admitting: Family Medicine

## 2019-03-07 ENCOUNTER — Encounter: Payer: Self-pay | Admitting: Family Medicine

## 2019-03-07 DIAGNOSIS — N63 Unspecified lump in unspecified breast: Secondary | ICD-10-CM | POA: Diagnosis not present

## 2019-03-07 DIAGNOSIS — Z659 Problem related to unspecified psychosocial circumstances: Secondary | ICD-10-CM

## 2019-03-07 NOTE — Patient Instructions (Signed)
Let me check on options in the meantime about your medicine and a mammogram.  We'll go from there. Take care.  Glad to see you.
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
## 2019-03-08 DIAGNOSIS — N63 Unspecified lump in unspecified breast: Secondary | ICD-10-CM | POA: Insufficient documentation

## 2019-03-08 MED ORDER — VENLAFAXINE HCL ER 37.5 MG PO CP24: 37.5000 mg | ORAL_CAPSULE | Freq: Every day | ORAL | Status: DC

## 2019-03-08 NOTE — Assessment & Plan Note (Signed)
~  6 o'clock on the L breast at the junction of the lower breast and the chest wall.  Mammogram ordered.  Discussed.

## 2019-03-08 NOTE — Assessment & Plan Note (Signed)
I am checking on options to see if we can get patient help through family services and also financial assistance on venlafaxine.  At this point still okay for outpatient follow-up.  No suicidal or homicidal intent.

## 2019-03-12 ENCOUNTER — Telehealth: Payer: Self-pay

## 2019-03-12 NOTE — Telephone Encounter (Signed)
Left message on patients cell phone voicemail to return my call regarding medication assistance and community resources.  Olean Ree [phone]

## 2019-03-12 NOTE — Telephone Encounter (Signed)
Thanks

## 2019-03-13 ENCOUNTER — Telehealth: Payer: Self-pay

## 2019-03-13 MED ORDER — VENLAFAXINE HCL ER 37.5 MG PO CP24: 37.5000 mg | ORAL_CAPSULE | Freq: Every day | ORAL | 3 refills | Status: DC

## 2019-03-13 NOTE — Addendum Note (Signed)
Addended by: Joaquim Nam on: 03/13/2019 04:13 PM   Modules accepted: Orders

## 2019-03-13 NOTE — Telephone Encounter (Signed)
I couldn't get the prescription to go through electronically.  I called into the pharmacy with the sig as listed.  Thanks.

## 2019-03-13 NOTE — Addendum Note (Signed)
Addended by: Sherrie George on: 03/13/2019 06:16 PM   Modules accepted: Orders

## 2019-03-13 NOTE — Telephone Encounter (Signed)
Please check with patient.  See if she was able to get any price check/med help with venlafaxine 37.5mg  XR tabs. I can send rx if needed.  Please let me know.  Thanks.

## 2019-03-13 NOTE — Telephone Encounter (Signed)
Contacted pt and advised with the Good [facility] card the medication would be $11.74 at [facility]. Pt would like the script sent there. Pt very appreciative. Advised if anything else is needed to contact this office. Pt verbalized understanding.

## 2019-03-13 NOTE — Telephone Encounter (Signed)
03/13/2019 Spoke with patient about resources through [employer] in [place] and [place], [employer] and [employer].  Will follow up with patient in a few days. Jessica Barron [phone]

## 2019-03-13 NOTE — Addendum Note (Signed)
Addended by: Joaquim Nam on: 03/13/2019 06:25 PM   Modules accepted: Orders

## 2019-03-19 ENCOUNTER — Ambulatory Visit (INDEPENDENT_AMBULATORY_CARE_PROVIDER_SITE_OTHER): Payer: 59 | Admitting: Clinical

## 2019-03-19 DIAGNOSIS — F4323 Adjustment disorder with mixed anxiety and depressed mood: Secondary | ICD-10-CM | POA: Diagnosis not present

## 2019-03-21 ENCOUNTER — Telehealth: Payer: Self-pay

## 2019-03-21 NOTE — Telephone Encounter (Signed)
03/21/2019 Left message on voicemail to follow up with patient regarding community resources. Olean Ree [phone]

## 2019-03-22 NOTE — Telephone Encounter (Signed)
Noted. Thanks.

## 2019-03-27 ENCOUNTER — Telehealth: Payer: Self-pay

## 2019-03-27 NOTE — Telephone Encounter (Signed)
03/27/2019 Called to follow-up with patient about community resources and [employer] program for Effexor. Olean Ree [phone]

## 2019-03-27 NOTE — Telephone Encounter (Signed)
Thanks

## 2019-04-02 ENCOUNTER — Ambulatory Visit (INDEPENDENT_AMBULATORY_CARE_PROVIDER_SITE_OTHER): Payer: 59 | Admitting: Clinical

## 2019-04-02 DIAGNOSIS — F4323 Adjustment disorder with mixed anxiety and depressed mood: Secondary | ICD-10-CM | POA: Diagnosis not present

## 2019-04-05 ENCOUNTER — Encounter: Payer: Self-pay | Admitting: Family Medicine

## 2019-04-26 ENCOUNTER — Other Ambulatory Visit: Payer: 59

## 2019-05-06 ENCOUNTER — Ambulatory Visit (INDEPENDENT_AMBULATORY_CARE_PROVIDER_SITE_OTHER): Payer: 59 | Admitting: Clinical

## 2019-05-06 DIAGNOSIS — F4323 Adjustment disorder with mixed anxiety and depressed mood: Secondary | ICD-10-CM

## 2019-05-07 ENCOUNTER — Ambulatory Visit
Admission: RE | Admit: 2019-05-07 | Discharge: 2019-05-07 | Disposition: A | Discharge status: Home / Self Care | Payer: 59 | Source: Ambulatory Visit | Attending: Family Medicine | Admitting: Family Medicine

## 2019-05-07 ENCOUNTER — Other Ambulatory Visit: Payer: Self-pay | Admitting: Family Medicine

## 2019-05-07 DIAGNOSIS — N63 Unspecified lump in unspecified breast: Secondary | ICD-10-CM

## 2019-06-11 ENCOUNTER — Other Ambulatory Visit: Payer: Self-pay

## 2019-06-11 ENCOUNTER — Other Ambulatory Visit (HOSPITAL_COMMUNITY)
Admission: RE | Admit: 2019-06-11 | Discharge: 2019-06-11 | Disposition: A | Discharge status: Home / Self Care | Payer: 59 | Source: Ambulatory Visit | Attending: Obstetrics & Gynecology | Admitting: Obstetrics & Gynecology

## 2019-06-11 ENCOUNTER — Encounter: Payer: Self-pay | Admitting: Obstetrics & Gynecology

## 2019-06-11 ENCOUNTER — Ambulatory Visit (INDEPENDENT_AMBULATORY_CARE_PROVIDER_SITE_OTHER): Payer: 59 | Admitting: Obstetrics & Gynecology

## 2019-06-11 ENCOUNTER — Ambulatory Visit: Payer: 59 | Admitting: Obstetrics & Gynecology

## 2019-06-11 ENCOUNTER — Ambulatory Visit: Payer: 59 | Admitting: Clinical

## 2019-06-11 VITALS — BP 136/80 | HR 72 | Wt 257.6 lb

## 2019-06-11 DIAGNOSIS — Z1272 Encounter for screening for malignant neoplasm of vagina: Secondary | ICD-10-CM | POA: Diagnosis not present

## 2019-06-11 DIAGNOSIS — Z01419 Encounter for gynecological examination (general) (routine) without abnormal findings: Secondary | ICD-10-CM | POA: Diagnosis present

## 2019-06-11 NOTE — Progress Notes (Signed)
GYNECOLOGY ANNUAL PREVENTATIVE CARE ENCOUNTER NOTE  History:     Jessica Barron is a 39 y.o. G89P1001 female here for a routine annual gynecologic exam.  Current complaints: none.   Denies abnormal vaginal bleeding, discharge, pelvic pain, problems with intercourse or other gynecologic concerns.    Gynecologic History No LMP recorded (lmp unknown). Contraception: OCP (estrogen/progesterone) Last Pap: 11/14/2017. Results were: normal with negative HPV  Obstetric History OB History  Gravida Para Term Preterm AB Living  1 1 1  0 0 1  SAB TAB Ectopic Multiple Live Births  0 0 0 0 1    # Outcome Date GA Lbr Len/2nd Weight Sex Delivery Anes PTL Lv  1 Term 02/25/11 [redacted]w[redacted]d  9 lb 8.6 oz (4.325 kg) F CS-LTranv Spinal  LIV     Birth Comments: NICU stay for persistent hypoglycemia Passed hearing screen     Complications: Breech presentation of fetus    Past Medical History:  Diagnosis Date  . Abnormal Pap smear    repeat WNL  . Depression   . GERD (gastroesophageal reflux disease)   . History of PCOS     Past Surgical History:  Procedure Laterality Date  . CESAREAN SECTION  02/25/2011   Procedure: CESAREAN SECTION;  Surgeon: 04/25/2011, MD;  Location: [facility];  Service: Gynecology;  Laterality: N/A;  . CHOLECYSTECTOMY      Current Outpatient Medications on File Prior to Visit  Medication Sig Dispense Refill  . Cholecalciferol (VITAMIN D3) 5000 units TABS Take 5,000 Units by mouth.    . Magnesium Gluconate (MAGNESIUM 27) 500 (27 Mg) MG TABS Take 1 tablet by mouth daily.    Jessica Barron spironolactone (ALDACTONE) 100 MG tablet     . TRI-LO-SPRINTEC 0.18/0.215/0.25 MG-25 MCG tab Take 1 tablet by mouth daily.    . TURMERIC PO Take by mouth. Taking daily.    02[birth year]1981 venlafaxine XR (EFFEXOR XR) 37.5 MG 24 hr capsule Take 1 capsule (37.5 mg total) by mouth daily with breakfast. (Patient not taking: Reported on 06/11/2019) 90 capsule 3   No current facility-administered medications on file prior  to visit.    Allergies  Allergen Reactions  . Lexapro [Escitalopram Oxalate] Other (See Comments)    Blunted affect  . Wellbutrin [Bupropion] Other (See Comments)    intolerant    Social History:  reports that she has never smoked. She has never used smokeless tobacco. She reports current alcohol use. She reports that she does not use drugs.  Family History  Problem Relation Age of Onset  . Bladder Cancer Mother   . COPD Mother   . Cancer Mother        bladder  . Bladder Cancer Father   . Cancer Father        bladder  . Breast cancer Paternal Grandmother   . Anesthesia problems Neg Hx   . Colon cancer Neg Hx     The following portions of the patient's history were reviewed and updated as appropriate: allergies, current medications, past family history, past medical history, past social history, past surgical history and problem list.  Review of Systems Pertinent items noted in HPI and remainder of comprehensive ROS otherwise negative.  Physical Exam:  BP 136/80   Pulse 72   Wt 257 lb 9.6 oz (116.8 kg)   LMP  (LMP Unknown)   BMI 47.12 kg/m  CONSTITUTIONAL: Well-developed, well-nourished female in no acute distress.  HENT:  Normocephalic, atraumatic, External right and left ear normal. Oropharynx  is clear and moist EYES: Conjunctivae and EOM are normal. Pupils are equal, round, and reactive to light. No scleral icterus.  NECK: Normal range of motion, supple, no masses.  Normal thyroid.  SKIN: Skin is warm and dry. No rash noted. Not diaphoretic. No erythema. No pallor. MUSCULOSKELETAL: Normal range of motion. No tenderness.  No cyanosis, clubbing, or edema.   NEUROLOGIC: Alert and oriented to person, place, and time. Normal reflexes, muscle tone coordination. No cranial nerve deficit noted. PSYCHIATRIC: Normal mood and affect. Normal behavior. Normal judgment and thought content. CARDIOVASCULAR: Normal heart rate noted, regular rhythm RESPIRATORY: Clear to auscultation  bilaterally. Effort and breath sounds normal, no problems with respiration noted. BREASTS: Symmetric in size. No masses, tenderness, skin changes, nipple drainage, or lymphadenopathy bilaterally.  Performed in the presence of a chaperone. ABDOMEN: Soft, obese, no distention appreciated.  No tenderness, rebound or guarding.  PELVIC: Normal appearing external genitalia and urethral meatus; normal appearing vaginal mucosa and cervix.  Normal appearing discharge.  Pap smear obtained.  Unable to palpate uterus or adnexa secondary to habitus.  Performed in the presence of a chaperone.   Assessment and Plan:    1. Well woman exam with routine gynecological exam - Cytology - PAP - Hepatitis B surface antigen - Hepatitis C antibody - RPR - HIV Antibody (routine testing w rflx) Will follow up results of pap smear and STI screen results and manage accordingly. Routine preventative health maintenance measures emphasized. Please refer to After Visit Summary for other counseling recommendations.      Verita Schneiders, MD, [place] [facility] [employer], [place]

## 2019-06-12 LAB — HEPATITIS C ANTIBODY: Hep C Virus Ab: <0.1 s/co ratio (ref 0.0–0.9)

## 2019-06-12 LAB — HEPATITIS B SURFACE ANTIGEN: Hepatitis B Surface Ag: NEGATIVE

## 2019-06-12 LAB — HIV ANTIBODY (ROUTINE TESTING W REFLEX): HIV Screen 4th Generation wRfx: NONREACTIVE

## 2019-06-12 LAB — RPR: RPR Ser Ql: NONREACTIVE

## 2019-06-14 LAB — CYTOLOGY - PAP
Chlamydia: NEGATIVE
Comment: NEGATIVE
Comment: NEGATIVE
Comment: NEGATIVE
Comment: NORMAL
Diagnosis: NEGATIVE
High risk HPV: NEGATIVE
Neisseria Gonorrhea: NEGATIVE
Trichomonas: NEGATIVE

## 2019-09-10 ENCOUNTER — Ambulatory Visit: Payer: 59 | Admitting: Clinical

## 2019-09-25 ENCOUNTER — Ambulatory Visit: Payer: 59 | Admitting: Clinical

## 2020-02-07 DIAGNOSIS — U071 COVID-19: Secondary | ICD-10-CM

## 2020-02-07 HISTORY — DX: COVID-19: U07.1

## 2020-02-11 ENCOUNTER — Encounter: Payer: Self-pay | Admitting: Family Medicine

## 2020-03-17 ENCOUNTER — Encounter: Payer: Self-pay | Admitting: Family Medicine

## 2020-03-17 ENCOUNTER — Ambulatory Visit (INDEPENDENT_AMBULATORY_CARE_PROVIDER_SITE_OTHER): Payer: 59 | Admitting: Family Medicine

## 2020-03-17 ENCOUNTER — Other Ambulatory Visit: Payer: Self-pay

## 2020-03-17 VITALS — BP 118/64 | HR 101 | Temp 98.2°F | Wt 201.2 lb

## 2020-03-17 DIAGNOSIS — Z3169 Encounter for other general counseling and advice on procreation: Secondary | ICD-10-CM

## 2020-03-17 DIAGNOSIS — N926 Irregular menstruation, unspecified: Secondary | ICD-10-CM | POA: Diagnosis not present

## 2020-03-17 LAB — POCT URINE PREGNANCY: Preg Test, Ur: NEGATIVE

## 2020-03-17 NOTE — Assessment & Plan Note (Signed)
Urine pregnancy was negative. Discussed that this could still be a pregnancy and offered blood testing. Discussed if positive would continue to monitor and plan OB f/u. If negative it could be some irregularity related to PCOS - though pt notes regular cycles. F/u testing and encouraged OB/GYN

## 2020-03-17 NOTE — Assessment & Plan Note (Signed)
40 yo with PCOS and 6+ months of TOC without pregnancy. Advised importance of reaching out to OB/GYN to consider fertility work-up. Of note, partner with varicocele and possible low sperm count.

## 2020-03-17 NOTE — Patient Instructions (Addendum)
Call the OB/GYN office tomorrow to schedule a follow-up visit to discuss fertility   Blood work today to look for early pregnancy signs

## 2020-03-17 NOTE — Progress Notes (Signed)
   Subjective:     Jessica Barron is a 40 y.o. female presenting for Amenorrhea (Last period 02/11/20)     HPI  #Missed period - was expected to start 2/16 - has been trying to get pregnant since June 2021 - has regular cycles - last time she missed a period was when she was pregnant with her daughter 2012 - did have unprotected intercourse while ovulating - spotting 03/01/2020 -   Review of Systems   Social History   Tobacco Use  Smoking Status Never Smoker  Smokeless Tobacco Never Used        Objective:    BP Readings from Last 3 Encounters:  03/17/20 118/64  06/11/19 136/80  03/07/19 138/84   Wt Readings from Last 3 Encounters:  03/17/20 201 lb 4 oz (91.3 kg)  06/11/19 257 lb 9.6 oz (116.8 kg)  03/07/19 254 lb 8 oz (115.4 kg)    BP 118/64   Pulse (!) 101   Temp 98.2 F (36.8 C) (Temporal)   Wt 201 lb 4 oz (91.3 kg)   LMP 02/11/2020 (Exact Date)   SpO2 97%   BMI 36.81 kg/m    Physical Exam Constitutional:      General: She is not in acute distress.    Appearance: She is well-developed. She is not diaphoretic.  HENT:     Right Ear: External ear normal.     Left Ear: External ear normal.  Eyes:     Conjunctiva/sclera: Conjunctivae normal.  Cardiovascular:     Rate and Rhythm: Normal rate.  Pulmonary:     Effort: Pulmonary effort is normal.  Musculoskeletal:     Cervical back: Neck supple.  Skin:    General: Skin is warm and dry.     Capillary Refill: Capillary refill takes less than 2 seconds.  Neurological:     Mental Status: She is alert. Mental status is at baseline.  Psychiatric:        Mood and Affect: Mood normal.        Behavior: Behavior normal.           Assessment & Plan:   Problem List Items Addressed This Visit      Other   Infertility counseling    40 yo with PCOS and 6+ months of TOC without pregnancy. Advised importance of reaching out to OB/GYN to consider fertility work-up. Of note, partner with varicocele and  possible low sperm count.       Missed period - Primary    Urine pregnancy was negative. Discussed that this could still be a pregnancy and offered blood testing. Discussed if positive would continue to monitor and plan OB f/u. If negative it could be some irregularity related to PCOS - though pt notes regular cycles. F/u testing and encouraged OB/GYN      Relevant Orders   POCT urine pregnancy (Completed)   hCG, quantitative, pregnancy       Return if symptoms worsen or fail to improve.  Lynnda Child, MD  This visit occurred during the SARS-CoV-2 public health emergency.  Safety protocols were in place, including screening questions prior to the visit, additional usage of staff PPE, and extensive cleaning of exam room while observing appropriate contact time as indicated for disinfecting solutions.

## 2020-03-18 LAB — HCG, QUANTITATIVE, PREGNANCY: Quantitative HCG: <0.6 m[IU]/mL

## 2020-04-20 ENCOUNTER — Encounter: Payer: Self-pay | Admitting: Radiology

## 2020-05-11 ENCOUNTER — Other Ambulatory Visit: Payer: Self-pay | Admitting: Family Medicine

## 2020-05-11 ENCOUNTER — Encounter: Payer: Self-pay | Admitting: Family Medicine

## 2020-05-11 ENCOUNTER — Telehealth: Payer: Self-pay

## 2020-05-11 DIAGNOSIS — Z659 Problem related to unspecified psychosocial circumstances: Secondary | ICD-10-CM

## 2020-05-11 MED ORDER — AMOXICILLIN 875 MG PO TABS: 875.0000 mg | ORAL_TABLET | Freq: Two times a day (BID) | ORAL | 0 refills | Status: DC

## 2020-05-11 NOTE — Telephone Encounter (Signed)
Patient called and stated that for the past couple of days she has experienced swollen tonsils with white patches, nasal congestion, body aches, fever, fatigue, N/V/D, and decreased appetite. Patient denies SOB, chest pain, loss of taste or smell. No VV appointments available today or tomorrow. Patient currently has no insurance. Patient stated that she would call back in the morning to see if there was any available appointments. UC and ED precautions given. Patient verbalized understanding.

## 2020-05-11 NOTE — Telephone Encounter (Signed)
Called patient to update her of antibiotic being prescribed and to call back if she does not feel better. Patient verbalized understanding.

## 2020-05-15 ENCOUNTER — Telehealth: Payer: Self-pay

## 2020-05-15 NOTE — Telephone Encounter (Signed)
   Telephone encounter was:  Successful.  05/15/2020 Name: Senovia Gauer MRN: [id number] DOB: 20-Jun-1980  Solimar Maiden is a 40 y.o. year old female who is a primary care patient of Para March, Dwana Curd, MD . The community resource team was consulted for assistance with Health Clinic information  Care guide performed the following interventions: Patient provided with information about care guide support team and interviewed to confirm resource needs Spoke to patient about local health clinics emailed resource information for the [facility] and [facility]..  Follow Up Plan:  Care guide will follow up with patient by phone over the next 7 days  Aailyah Dunbar, AAS Paralegal, [facility] Care Guide . Embedded Care Coordination [facility] Health  Care Management  [address] [place], [place] [address] ??[email or web address]  ?? [phone]   [email or web address]

## 2020-05-15 NOTE — Telephone Encounter (Signed)
Pt called stating that she is feeling a little better since starting Ax but she wants to know if she should still f/u with you next week just in case something additional is needed or referral  Please advise

## 2020-05-17 NOTE — Telephone Encounter (Signed)
If she is clearly feeling better and continuing to improve then I think it would be okay to push the appointment back.  If she is not feeling better or if she is feeling worse or if she has other concerns, then I think it makes sense to keep the appointment.  Thanks.

## 2020-05-18 NOTE — Telephone Encounter (Signed)
Replied to patient via mychart message that was sent through today. Advised patient to call and schedule and appt or seek eval at UC if can not be seen soon.

## 2020-05-18 NOTE — Telephone Encounter (Signed)
Patient also sent mychart message this morning; she is feeling worse per message after taking the abx for 7 days. Do you want her to come in the office or VV?

## 2020-05-18 NOTE — Telephone Encounter (Signed)
I think she needs to be seen in person.  If this can't happen here in the near future, then UC is likely the best option.

## 2020-05-18 NOTE — Telephone Encounter (Signed)
LMTCB

## 2020-05-19 ENCOUNTER — Telehealth: Payer: Self-pay

## 2020-05-19 NOTE — Telephone Encounter (Signed)
   Telephone encounter was:  Successful.  05/19/2020 Name: Jessica Barron MRN: [id number] DOB: 29-Mar-1980  Jessica Barron is a 40 y.o. year old female who is a primary care patient of Joaquim Nam, MD . The community resource team was consulted for assistance with local health clinics and affordable healthcare  Care guide performed the following interventions: Follow up call placed to the patient to discuss status of referral  Spoke with patient she has received emailed resources for local health clinics and affordable health care.  Follow Up Plan:  No further follow up planned at this time. The patient has been provided with needed resources.  Jessica Barron, AAS Paralegal, [facility] Care Guide . Embedded Care Coordination [facility] Health  Care Management  [address] [place], [place] [address] ??[email or web address]  ?? [phone]   [email or web address]

## 2020-05-21 ENCOUNTER — Ambulatory Visit: Payer: 59 | Admitting: Family Medicine

## 2020-05-21 ENCOUNTER — Ambulatory Visit (INDEPENDENT_AMBULATORY_CARE_PROVIDER_SITE_OTHER): Payer: Self-pay | Admitting: Internal Medicine

## 2020-05-21 ENCOUNTER — Other Ambulatory Visit: Payer: Self-pay

## 2020-05-21 ENCOUNTER — Encounter: Payer: Self-pay | Admitting: Internal Medicine

## 2020-05-21 DIAGNOSIS — B349 Viral infection, unspecified: Secondary | ICD-10-CM | POA: Insufficient documentation

## 2020-05-21 NOTE — Progress Notes (Signed)
Subjective:    Patient ID: Jessica Barron, female    DOB: 10/31/1980, 40 y.o.   MRN: [id number]  HPI Here due to sore throat With boyfriend Arlys John This visit occurred during the SARS-CoV-2 public health emergency.  Safety protocols were in place, including screening questions prior to the visit, additional usage of staff PPE, and extensive cleaning of exam room while observing appropriate contact time as indicated for disinfecting solutions.   Boyfriend and daughter had mild illnesses ~4/11 Resolved quickly Acquaintance had similar illness but also stomach symptoms She got some nausea the next night----had round of vomiting Thought she was better--then realized she didn't  Had to spend the day lying down Fever noted then Better the next morning---just slight throat irritation (had stayed with boyfriend and didn't have her CPAP) The throat irritation worsened about 11 days ago--swollen and painful Trouble swallowing Fever, leg "felt like lead"  Then saw white spots in throat and felt worse (but didn't think it was strep)  Started on amoxicillin by Dr Para March about 10 days ago--due to appearance of tonsils on email picture Throat is some better but still tender and swollen--especially on right side Some of the "pus pockets" are better No energy--boyfriend has to cook and do household tasks  Current Outpatient Medications on File Prior to Visit  Medication Sig Dispense Refill  . amoxicillin (AMOXIL) 875 MG tablet Take 1 tablet (875 mg total) by mouth 2 (two) times daily. 20 tablet 0  . Cholecalciferol (VITAMIN D3) 5000 units TABS Take 5,000 Units by mouth. (Patient not taking: Reported on 05/21/2020)    . Magnesium Gluconate (MAGNESIUM 27) 500 (27 Mg) MG TABS Take 1 tablet by mouth daily. (Patient not taking: Reported on 05/21/2020)    . spironolactone (ALDACTONE) 100 MG tablet  (Patient not taking: Reported on 05/21/2020)    . TURMERIC PO Take by mouth. Taking daily. (Patient not taking:  Reported on 05/21/2020)    . venlafaxine XR (EFFEXOR XR) 37.5 MG 24 hr capsule Take 1 capsule (37.5 mg total) by mouth daily with breakfast. (Patient not taking: Reported on 05/21/2020) 90 capsule 3   No current facility-administered medications on file prior to visit.    Allergies  Allergen Reactions  . Lexapro [Escitalopram Oxalate] Other (See Comments)    Blunted affect  . Wellbutrin [Bupropion] Other (See Comments)    intolerant    Past Medical History:  Diagnosis Date  . Abnormal Pap smear    repeat WNL  . COVID-19 02/07/2020  . Depression   . GERD (gastroesophageal reflux disease)   . History of PCOS     Past Surgical History:  Procedure Laterality Date  . CESAREAN SECTION  02/25/2011   Procedure: CESAREAN SECTION;  Surgeon: Leslie Andrea, MD;  Location: [facility];  Service: Gynecology;  Laterality: N/A;  . CHOLECYSTECTOMY      Family History  Problem Relation Age of Onset  . Bladder Cancer Mother   . COPD Mother   . Cancer Mother        bladder  . Bladder Cancer Father   . Cancer Father        bladder  . Breast cancer Paternal Grandmother   . Anesthesia problems Neg Hx   . Colon cancer Neg Hx     Social History   Socioeconomic History  . Marital status: Divorced    Spouse name: Not on file  . Number of children: Not on file  . Years of education: Not on file  .  Highest education level: Not on file  Occupational History  . Not on file  Tobacco Use  . Smoking status: Never Smoker  . Smokeless tobacco: Never Used  Substance and Sexual Activity  . Alcohol use: Yes    Comment: rare  . Drug use: No  . Sexual activity: Yes    Birth control/protection: Pill  Other Topics Concern  . Not on file  Social History Narrative   Married 2010- separated from husband 2020.     1 daughter   Stay at home mom   Social Determinants of Health   Financial Resource Strain: Not on file  Food Insecurity: Not on file  Transportation Needs: Not on file  Physical  Activity: Not on file  Stress: Not on file  Social Connections: Not on file  Intimate Partner Violence: Not on file   Review of Systems Having some back pain--also some in chest Had nausea---loss of appetite----still persists to some degree Has lost ~10# No SOB No diarrhea Only occasional cough    Objective:   Physical Exam Constitutional:      Appearance: Normal appearance.  HENT:     Mouth/Throat:     Comments: slight tonsillar enlargement R/L Slight white patches Neck:     Comments: Mildly tender anterior cervical nodes R>L  Cardiovascular:     Rate and Rhythm: Normal rate and regular rhythm.     Heart sounds: No murmur heard. No gallop.   Pulmonary:     Effort: Pulmonary effort is normal.     Breath sounds: Normal breath sounds. No wheezing or rales.  Abdominal:     Palpations: Abdomen is soft. There is no mass.     Tenderness: There is no abdominal tenderness.     Comments: No apparent HSM  Musculoskeletal:     Cervical back: Neck supple.  Neurological:     Mental Status: She is alert.            Assessment & Plan:

## 2020-05-21 NOTE — Assessment & Plan Note (Signed)
Could be the adenovirus in the community More like an infectious mono type picture Is improved No worrisome features Reassured----supportive care only with tylenol, rest, etc

## 2020-05-25 ENCOUNTER — Ambulatory Visit: Payer: 59 | Admitting: Family Medicine

## 2020-05-25 ENCOUNTER — Emergency Department
Admission: EM | Admit: 2020-05-25 | Discharge: 2020-05-25 | Disposition: A | Discharge status: Home / Self Care | Payer: Medicaid Other | Attending: Emergency Medicine | Admitting: Emergency Medicine

## 2020-05-25 ENCOUNTER — Other Ambulatory Visit: Payer: Self-pay

## 2020-05-25 DIAGNOSIS — R110 Nausea: Secondary | ICD-10-CM

## 2020-05-25 DIAGNOSIS — Z8616 Personal history of COVID-19: Secondary | ICD-10-CM | POA: Diagnosis not present

## 2020-05-25 DIAGNOSIS — B349 Viral infection, unspecified: Secondary | ICD-10-CM | POA: Insufficient documentation

## 2020-05-25 DIAGNOSIS — Z8619 Personal history of other infectious and parasitic diseases: Secondary | ICD-10-CM

## 2020-05-25 DIAGNOSIS — Z20822 Contact with and (suspected) exposure to covid-19: Secondary | ICD-10-CM | POA: Insufficient documentation

## 2020-05-25 LAB — BASIC METABOLIC PANEL
Anion gap: 10 (ref 5–15)
BUN: 12 mg/dL (ref 6–20)
CO2: 24 mmol/L (ref 22–32)
Calcium: 9 mg/dL (ref 8.9–10.3)
Chloride: 103 mmol/L (ref 98–111)
Creatinine, Ser: 0.76 mg/dL (ref 0.44–1.00)
GFR, Estimated: >60 mL/min (ref >60)
Glucose, Bld: 81 mg/dL (ref 70–99)
Potassium: 3.5 mmol/L (ref 3.5–5.1)
Sodium: 137 mmol/L (ref 135–145)

## 2020-05-25 LAB — CBC WITH DIFFERENTIAL/PLATELET
Abs Immature Granulocytes: 0.03 10*3/uL (ref 0.00–0.07)
Basophils Absolute: 0 10*3/uL (ref 0.0–0.1)
Basophils Relative: 0 %
Eosinophils Absolute: 0 10*3/uL (ref 0.0–0.5)
Eosinophils Relative: 0 %
HCT: 36.1 % (ref 36.0–46.0)
Hemoglobin: 11.4 g/dL — ABNORMAL LOW (ref 12.0–15.0)
Immature Granulocytes: 0 %
Lymphocytes Relative: 28 %
Lymphs Abs: 2.7 10*3/uL (ref 0.7–4.0)
MCH: 26.4 pg (ref 26.0–34.0)
MCHC: 31.6 g/dL (ref 30.0–36.0)
MCV: 83.6 fL (ref 80.0–100.0)
Monocytes Absolute: 0.5 10*3/uL (ref 0.1–1.0)
Monocytes Relative: 5 %
Neutro Abs: 6.4 10*3/uL (ref 1.7–7.7)
Neutrophils Relative %: 67 %
Platelets: 474 10*3/uL — ABNORMAL HIGH (ref 150–400)
RBC: 4.32 MIL/uL (ref 3.87–5.11)
RDW: 14.1 % (ref 11.5–15.5)
WBC: 9.7 10*3/uL (ref 4.0–10.5)
nRBC: 0 % (ref 0.0–0.2)

## 2020-05-25 LAB — URINALYSIS, COMPLETE (UACMP) WITH MICROSCOPIC
Bacteria, UA: NONE SEEN
Bilirubin Urine: NEGATIVE
Glucose, UA: NEGATIVE mg/dL
Hgb urine dipstick: NEGATIVE
Ketones, ur: 5 mg/dL — AB
Leukocytes,Ua: NEGATIVE
Nitrite: NEGATIVE
Protein, ur: NEGATIVE mg/dL
Specific Gravity, Urine: 1.026 (ref 1.005–1.030)
pH: 5 (ref 5.0–8.0)

## 2020-05-25 LAB — RESP PANEL BY RT-PCR (FLU A&B, COVID) ARPGX2
Influenza A by PCR: NEGATIVE
Influenza B by PCR: NEGATIVE
SARS Coronavirus 2 by RT PCR: NEGATIVE

## 2020-05-25 LAB — POC URINE PREG, ED: Preg Test, Ur: NEGATIVE

## 2020-05-25 LAB — LIPASE, BLOOD: Lipase: 30 U/L (ref 11–51)

## 2020-05-25 LAB — MONONUCLEOSIS SCREEN: Mono Screen: NEGATIVE

## 2020-05-25 MED ORDER — ONDANSETRON 4 MG PO TBDP
4.0000 mg | ORAL_TABLET | Freq: Once | ORAL | Status: AC
  Administered 2020-05-25: 4 mg via ORAL
  Filled 2020-05-25: qty 1

## 2020-05-25 MED ORDER — ONDANSETRON 4 MG PO TBDP: 4.0000 mg | ORAL_TABLET | Freq: Three times a day (TID) | ORAL | 0 refills | Status: DC | PRN

## 2020-05-25 NOTE — Discharge Instructions (Signed)
Your exam and labs are normal and reassuring at this time.  You have a mono test and a COVID/flu swab pending at this time.  Continue to take the nausea medicine as needed.  And hydrate to prevent dehydration.  Follow with your primary provider or return to the ED if needed.
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
## 2020-05-25 NOTE — ED Triage Notes (Signed)
Pt comes with c/o persistent nausea for several weeks. Pt states no improvement with meds. Pt states 1 episode of vomiting.

## 2020-06-18 ENCOUNTER — Encounter: Payer: Self-pay | Admitting: Family Medicine

## 2020-06-26 IMAGING — MG DIGITAL DIAGNOSTIC BILAT W/ TOMO W/ CAD
6 of 12 series · 6 of 36 positions shown · non-contrast
Comparison: None

CLINICAL DATA: The patient had a recent lump under her left breast.
She did express material from the lump which has improved.

EXAM:
DIGITAL DIAGNOSTIC BILATERAL MAMMOGRAM WITH CAD AND TOMO
ULTRASOUND BILATERAL BREAST

[L MLO synth-2D]
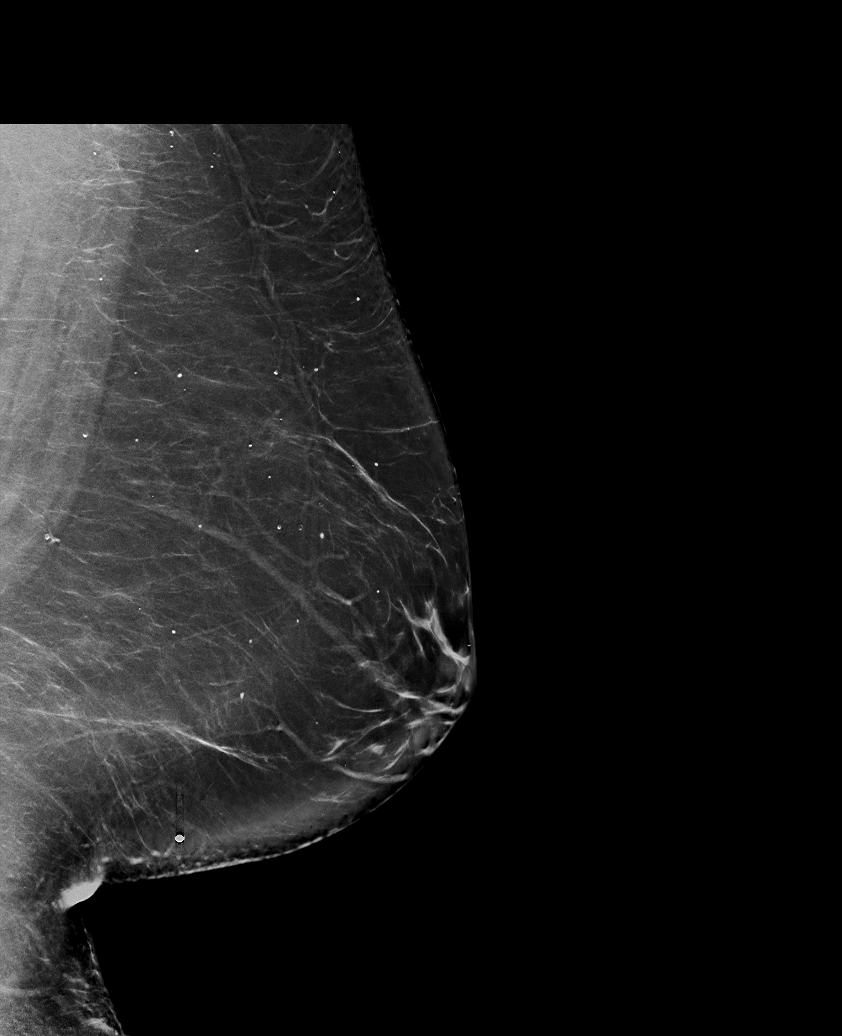

[R CC synth-2D (1 of 2)]
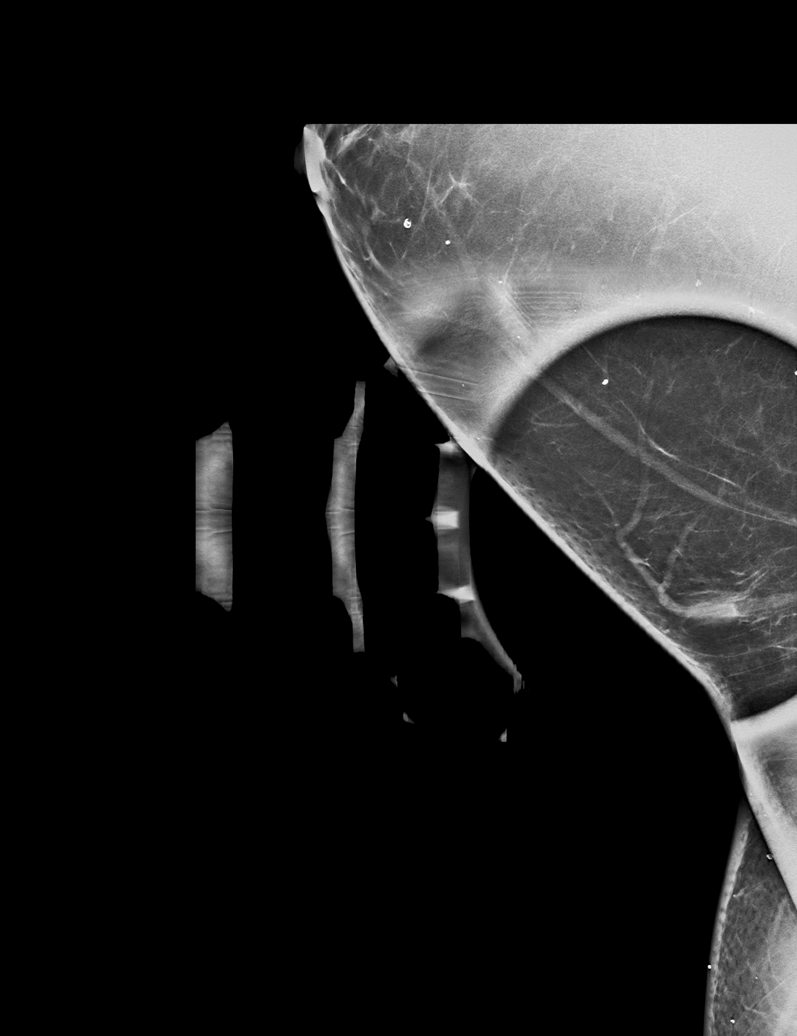

[R CC synth-2D (2 of 2)]
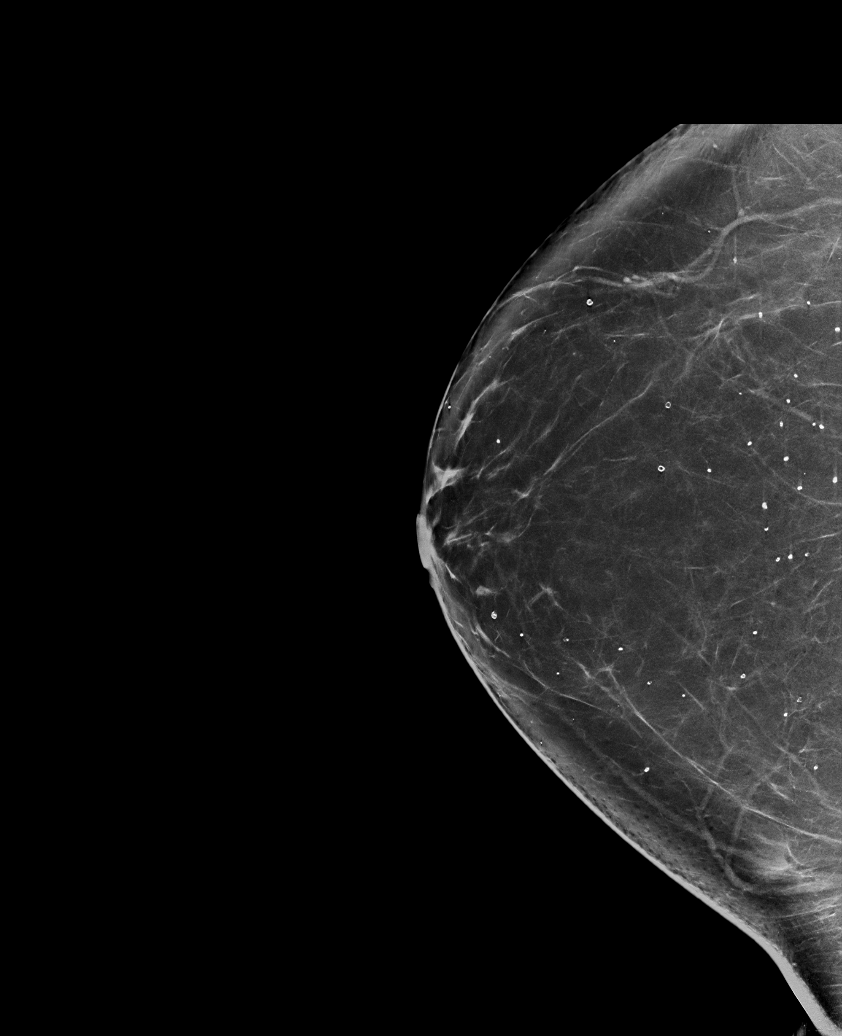

[L TAN synth-2D]
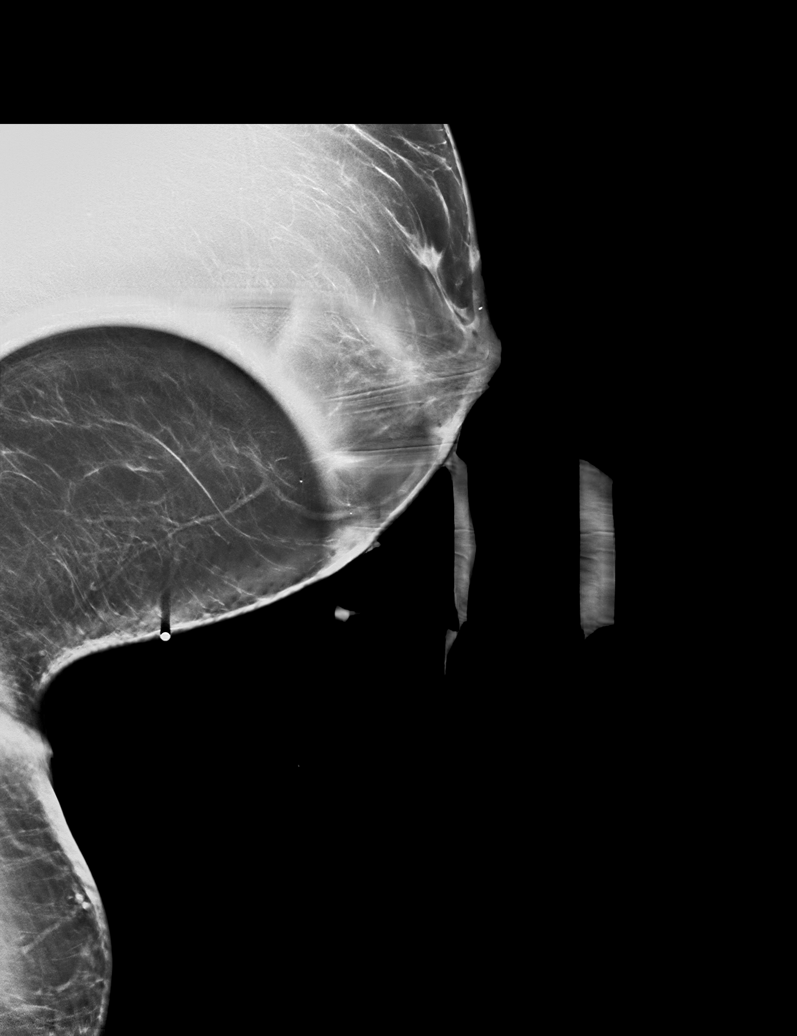

[L CC synth-2D]
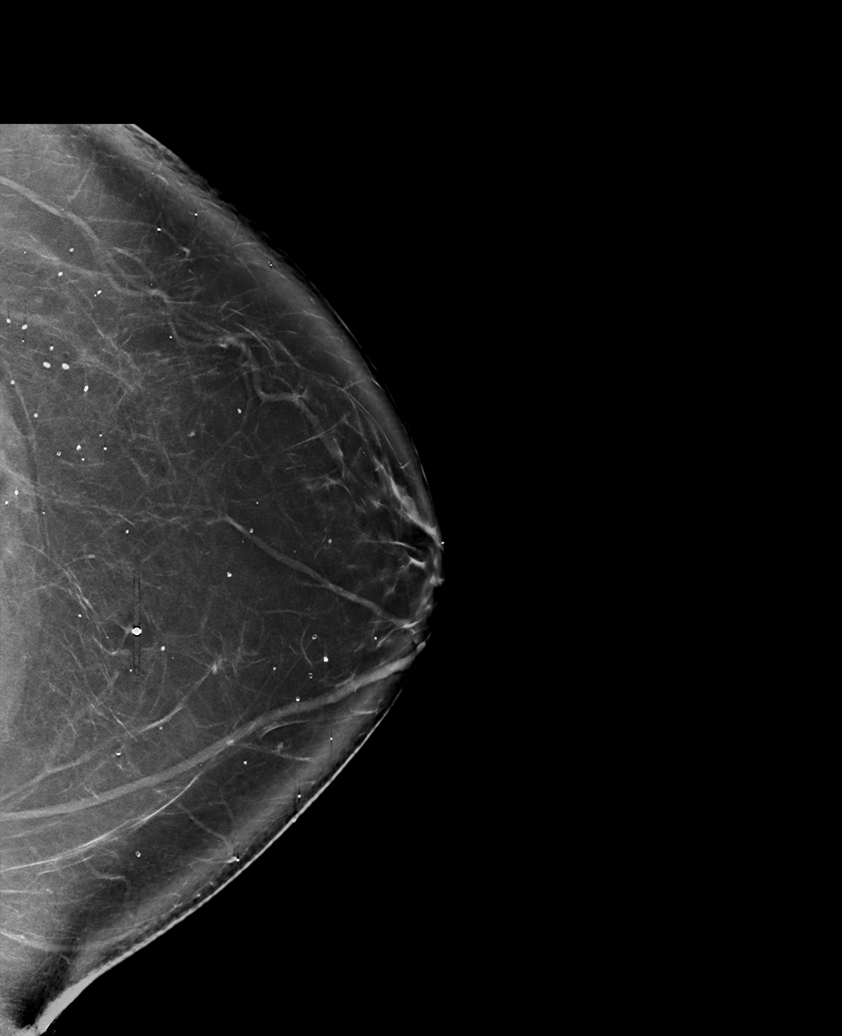

[R MLO synth-2D]
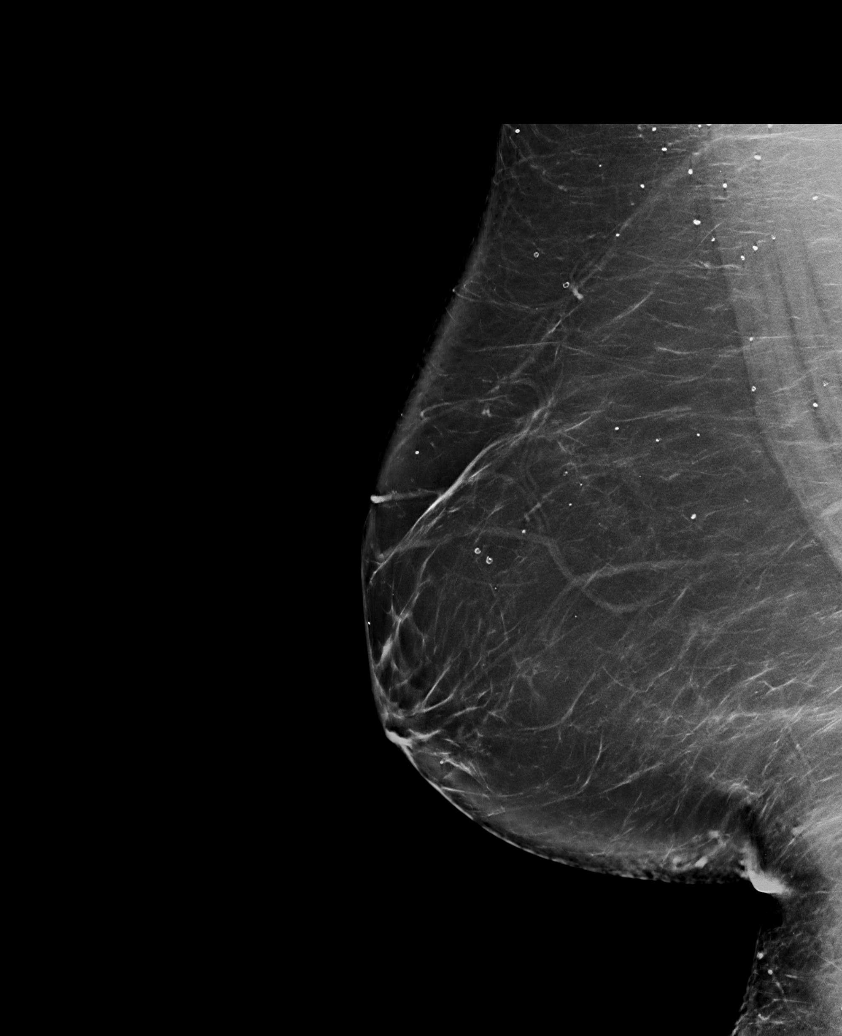

[6 of 36 positions shown; findings below may reference images not displayed]

ACR Breast Density Category b: There are scattered areas of
fibroglandular density.
FINDINGS: There is increased density in the far medial inferior right breast
which appears to be superficial. No other suspicious findings are
seen in either breast.

Mammographic images were processed with CAD.

On physical exam, a lump is felt in the region of the patient's
symptoms on the left. A superficial lump is also felt in the far
medial right breast correlating with mammographic findings.

Targeted ultrasound is performed, showing a probable sebaceous cyst
located within the skin of the left breast at 6 o'clock, 7 cm from
the nipple.

There is a sebaceous cyst on the right at 6 o'clock.

There is a hypoechoic mass along the undersurface of the skin on the
right at 4 o'clock. On real-time imaging, there is a connection
between this mass and the skin surface, consistent with a sebaceous
cyst.
IMPRESSION: Multiple sebaceous cysts bilaterally. No evidence of malignancy in
either breast.

RECOMMENDATION:
Annual screening mammography.

I have discussed the findings and recommendations with the patient.
If applicable, a reminder letter will be sent to the patient
regarding the next appointment.

BI-RADS CATEGORY  2: Benign.

## 2020-07-13 ENCOUNTER — Telehealth: Payer: Self-pay | Admitting: Family Medicine

## 2020-07-13 NOTE — Telephone Encounter (Signed)
Ms. Jablonski called in wanted to know if you would accept her fiance as a patient he is moving from [place].   Please advise

## 2020-07-14 NOTE — Telephone Encounter (Signed)
I am unable to take on new patients right now.  We have NPs coming on board soon and could likely see him sooner.  I would recommend that.

## 2020-07-14 NOTE — Telephone Encounter (Signed)
Advised patient about below and she was okay with this. Advised he can establish with one of the Nps and she would like him to establish with Audria Nine, NP once we are able to schedule. I advised patient schedule for Susy Frizzle should be ready within 1-2 wks and to call back to schedule then per [facility].

## 2020-07-21 ENCOUNTER — Encounter: Payer: Self-pay | Admitting: Family Medicine

## 2020-09-07 ENCOUNTER — Encounter: Payer: Self-pay | Admitting: Family Medicine

## 2020-09-08 ENCOUNTER — Other Ambulatory Visit: Payer: Self-pay | Admitting: Family Medicine

## 2020-09-08 MED ORDER — DOXYCYCLINE HYCLATE 100 MG PO TABS: 50.0000 mg | ORAL_TABLET | Freq: Two times a day (BID) | ORAL | 1 refills | Status: DC

## 2020-11-16 ENCOUNTER — Encounter: Payer: Self-pay | Admitting: Family Medicine

## 2020-11-18 ENCOUNTER — Other Ambulatory Visit: Payer: Self-pay | Admitting: Family Medicine

## 2020-11-18 DIAGNOSIS — L719 Rosacea, unspecified: Secondary | ICD-10-CM

## 2020-11-27 ENCOUNTER — Ambulatory Visit: Payer: Medicaid Other | Admitting: Family Medicine

## 2021-01-01 ENCOUNTER — Other Ambulatory Visit: Payer: Self-pay

## 2021-01-01 ENCOUNTER — Ambulatory Visit (INDEPENDENT_AMBULATORY_CARE_PROVIDER_SITE_OTHER): Payer: Medicaid Other

## 2021-01-01 ENCOUNTER — Ambulatory Visit (INDEPENDENT_AMBULATORY_CARE_PROVIDER_SITE_OTHER): Payer: Medicaid Other | Admitting: Family Medicine

## 2021-01-01 ENCOUNTER — Encounter: Payer: Self-pay | Admitting: Family Medicine

## 2021-01-01 VITALS — BP 122/80 | HR 77 | Temp 97.6°F | Ht 62.0 in | Wt 203.0 lb

## 2021-01-01 DIAGNOSIS — Z7189 Other specified counseling: Secondary | ICD-10-CM

## 2021-01-01 DIAGNOSIS — Z23 Encounter for immunization: Secondary | ICD-10-CM | POA: Diagnosis not present

## 2021-01-01 DIAGNOSIS — M25559 Pain in unspecified hip: Secondary | ICD-10-CM | POA: Diagnosis not present

## 2021-01-01 DIAGNOSIS — I7300 Raynaud's syndrome without gangrene: Secondary | ICD-10-CM

## 2021-01-01 DIAGNOSIS — L719 Rosacea, unspecified: Secondary | ICD-10-CM | POA: Diagnosis not present

## 2021-01-01 HISTORY — DX: Rosacea, unspecified: L71.9

## 2021-01-01 LAB — CBC WITH DIFFERENTIAL/PLATELET
Basophils Absolute: 0 10*3/uL (ref 0.0–0.1)
Basophils Relative: 0.4 % (ref 0.0–3.0)
Eosinophils Absolute: 0.1 10*3/uL (ref 0.0–0.7)
Eosinophils Relative: 1.1 % (ref 0.0–5.0)
HCT: 34.3 % — ABNORMAL LOW (ref 36.0–46.0)
Hemoglobin: 11 g/dL — ABNORMAL LOW (ref 12.0–15.0)
Lymphocytes Relative: 27.3 % (ref 12.0–46.0)
Lymphs Abs: 1.8 10*3/uL (ref 0.7–4.0)
MCHC: 32 g/dL (ref 30.0–36.0)
MCV: 80.9 fl (ref 78.0–100.0)
Monocytes Absolute: 0.5 10*3/uL (ref 0.1–1.0)
Monocytes Relative: 7.3 % (ref 3.0–12.0)
Neutro Abs: 4.3 10*3/uL (ref 1.4–7.7)
Neutrophils Relative %: 63.9 % (ref 43.0–77.0)
Platelets: 356 10*3/uL (ref 150.0–400.0)
RBC: 4.25 Mil/uL (ref 3.87–5.11)
RDW: 15.5 % (ref 11.5–15.5)
WBC: 6.7 10*3/uL (ref 4.0–10.5)

## 2021-01-01 LAB — BASIC METABOLIC PANEL
BUN: 11 mg/dL (ref 6–23)
CO2: 27 mEq/L (ref 19–32)
Calcium: 8.9 mg/dL (ref 8.4–10.5)
Chloride: 104 mEq/L (ref 96–112)
Creatinine, Ser: 0.73 mg/dL (ref 0.40–1.20)
GFR: 102.26 mL/min (ref >60.00)
Glucose, Bld: 79 mg/dL (ref 70–99)
Potassium: 3.8 mEq/L (ref 3.5–5.1)
Sodium: 138 mEq/L (ref 135–145)

## 2021-01-01 MED ORDER — DOXYCYCLINE HYCLATE 100 MG PO TABS: 50.0000 mg | ORAL_TABLET | Freq: Two times a day (BID) | ORAL | 3 refills | Status: DC

## 2021-01-01 NOTE — Progress Notes (Signed)
This visit occurred during the SARS-CoV-2 public health emergency.  Safety protocols were in place, including screening questions prior to the visit, additional usage of staff PPE, and extensive cleaning of exam room while observing appropriate contact time as indicated for disinfecting solutions.  Tetanus is up to date Flu 2022.  She had covid vaccine.   Advanced directive discussed with patient.  If patient is incapacitated she wants [facility] Earp designated.  She recently got engaged.    D/w pt about rosacea.  Prev was on doxy.  D/w pt about derm appointment- she is going to try to schedule.  Off doxy now- it clearly helped.  B malar rash noted today- that was better on doxy.  She had h/o dec in facial sx when she had episode of diarrhea in the past.   father likely had rosacea.  She was asking about possible lupus dx.  She is fair skinned and easy to sunburn at baseline.    She had changes in the hands with white coloration with cold exposure or high stress events.  Also noted on the feet with cold exposure.  Resolves with warming up.  We talked about presumed Raynaud's phenomena.  She has had joint pain, esp joint and knees and back.  R hip is the most bothersome.  She had sig  B but R>L pain after running recently.    Meds, vitals, and allergies reviewed.   ROS: Per HPI unless specifically indicated in ROS section   GEN: nad, alert and oriented HEENT: ncat NECK: supple w/o LA CV: rrr.  PULM: ctab, no inc wob ABD: soft, +bs EXT: no edema SKIN: chronic B facial rash, also on the nose.   R hip greater troch area not ttp. Pain with int but not ext rotation.  Able to bear weight.

## 2021-01-01 NOTE — Patient Instructions (Addendum)
Go to the lab on the way out.   If you have mychart we'll likely use that to update you.    Take care.  Glad to see you. Try restarting doxy in the meantime.

## 2021-01-03 DIAGNOSIS — M25559 Pain in unspecified hip: Secondary | ICD-10-CM | POA: Insufficient documentation

## 2021-01-03 DIAGNOSIS — I7300 Raynaud's syndrome without gangrene: Secondary | ICD-10-CM

## 2021-01-03 HISTORY — DX: Raynaud's syndrome without gangrene: I73.00

## 2021-01-03 HISTORY — DX: Pain in unspecified hip: M25.559

## 2021-01-03 NOTE — Assessment & Plan Note (Signed)
We talked about getting plain film imaging done today.  See notes on imaging.  She has pain with internal rotation so the concern is for degenerative changes at the right hip.

## 2021-01-03 NOTE — Assessment & Plan Note (Signed)
Likely the issue and much less likely to be lupus given her family history and previous response to doxycycline.  We talked about checking baseline labs today.  See notes on labs.  Restart doxycycline.

## 2021-01-03 NOTE — Assessment & Plan Note (Signed)
Hands look well perfused today.  See notes on labs.

## 2021-01-04 ENCOUNTER — Encounter: Payer: Self-pay | Admitting: Family Medicine

## 2021-03-09 ENCOUNTER — Encounter: Payer: Self-pay | Admitting: Family Medicine

## 2021-03-11 ENCOUNTER — Other Ambulatory Visit: Payer: Self-pay

## 2021-03-11 ENCOUNTER — Encounter: Payer: Self-pay | Admitting: Family

## 2021-03-11 ENCOUNTER — Ambulatory Visit (INDEPENDENT_AMBULATORY_CARE_PROVIDER_SITE_OTHER): Payer: Medicaid Other | Admitting: Family

## 2021-03-11 VITALS — BP 140/82 | HR 84 | Temp 98.6°F | Ht 62.0 in | Wt 210.0 lb

## 2021-03-11 DIAGNOSIS — J40 Bronchitis, not specified as acute or chronic: Secondary | ICD-10-CM | POA: Insufficient documentation

## 2021-03-11 DIAGNOSIS — Z20822 Contact with and (suspected) exposure to covid-19: Secondary | ICD-10-CM | POA: Diagnosis not present

## 2021-03-11 LAB — POC COVID19 BINAXNOW: SARS Coronavirus 2 Ag: NEGATIVE

## 2021-03-11 MED ORDER — AMOXICILLIN-POT CLAVULANATE 875-125 MG PO TABS: 1.0000 | ORAL_TABLET | Freq: Two times a day (BID) | ORAL | 0 refills | Status: DC

## 2021-03-11 MED ORDER — METHYLPREDNISOLONE 4 MG PO TBPK: ORAL_TABLET | ORAL | 0 refills | Status: DC

## 2021-03-11 NOTE — Assessment & Plan Note (Signed)
POCT COVID ordered negative in office

## 2021-03-11 NOTE — Assessment & Plan Note (Addendum)
Take antibiotic as prescribed. Increase oral fluids. Pt to f/u if sx worsen and or fail to improve in 2-3 days.  Also sent Medrol Dosepak for cough and chest tightness

## 2021-03-11 NOTE — Progress Notes (Signed)
Established Patient Office Visit  Subjective:  Patient ID: Jessica Barron, female    DOB: Jun 23, 1980  Age: 41 y.o. MRN: [id number]  CC:  Chief Complaint  Patient presents with   Sinus Problem    Pt stated-- coughing yellow/brown mucus, congestion--9 days    HPI Jessica Barron is here today with concerns.   Pt states that symptoms for the last 8 days. She c/o coughing up yellow/brown mucous as well as chest congestion. Some slight wheezing but after coughing all night , and better today. No fever did initially have sore throat but has since resolved. Does have pnd as well. Did have sinus pressure nasal congestion which has also since resolved.   Her daughter tested negative for covid strep and rsv as well as flu.    Past Medical History:  Diagnosis Date   Abnormal Pap smear    repeat WNL   COVID-19 02/07/2020   Depression    GERD (gastroesophageal reflux disease)    History of PCOS     Past Surgical History:  Procedure Laterality Date   CESAREAN SECTION  02/25/2011   Procedure: CESAREAN SECTION;  Surgeon: Leslie Andrea, MD;  Location: [facility];  Service: Gynecology;  Laterality: N/A;   CHOLECYSTECTOMY      Family History  Problem Relation Age of Onset   Bladder Cancer Mother    COPD Mother    Cancer Mother        bladder   Bladder Cancer Father    Cancer Father        bladder   Breast cancer Paternal Grandmother    Anesthesia problems Neg Hx    Colon cancer Neg Hx     Social History   Socioeconomic History   Marital status: Divorced    Spouse name: Not on file   Number of children: Not on file   Years of education: Not on file   Highest education level: Not on file  Occupational History   Not on file  Tobacco Use   Smoking status: Never   Smokeless tobacco: Never  Substance and Sexual Activity   Alcohol use: Yes    Comment: rare   Drug use: No   Sexual activity: Yes    Birth control/protection: Pill  Other Topics Concern   Not on file  Social  History Narrative   Married 2010- separated from husband 2020.     Social Determinants of Health   Financial Resource Strain: Not on file  Food Insecurity: Not on file  Transportation Needs: Not on file  Physical Activity: Not on file  Stress: Not on file  Social Connections: Not on file  Intimate Partner Violence: Not on file    Outpatient Medications Prior to Visit  Medication Sig Dispense Refill   doxycycline (VIBRA-TABS) 100 MG tablet Take 0.5 tablets (50 mg total) by mouth 2 (two) times daily. 30 tablet 3   No facility-administered medications prior to visit.    Allergies  Allergen Reactions   Lexapro [Escitalopram Oxalate] Other (See Comments)    Blunted affect   Wellbutrin [Bupropion] Other (See Comments)    intolerant    ROS Review of Systems  Constitutional:  Negative for chills and fever.  HENT:  Positive for congestion, sinus pressure and sore throat. Negative for ear pain. Trouble swallowing: poct covid.  Respiratory:  Positive for cough and wheezing. Negative for shortness of breath.   Cardiovascular:  Negative for chest pain and palpitations.     Objective:  Physical Exam Constitutional:      General: She is not in acute distress.    Appearance: She is obese. She is not ill-appearing, toxic-appearing or diaphoretic.  HENT:     Head: Normocephalic.     Right Ear: Tympanic membrane normal.     Left Ear: Tympanic membrane normal.     Nose: Nose normal.     Mouth/Throat:     Mouth: Mucous membranes are moist.  Eyes:     Pupils: Pupils are equal, round, and reactive to light.  Cardiovascular:     Rate and Rhythm: Normal rate and regular rhythm.  Pulmonary:     Effort: Pulmonary effort is normal.     Breath sounds: Normal breath sounds.  Neurological:     Mental Status: She is alert.    BP 140/82    Pulse 84    Temp 98.6 F (37 C)    Ht 5\' 2"  (1.575 m)    Wt 210 lb (95.3 kg)    SpO2 99%    BMI 38.41 kg/m  Wt Readings from Last 3 Encounters:   03/11/21 210 lb (95.3 kg)  01/01/21 203 lb (92.1 kg)  05/25/20 195 lb 1.7 oz (88.5 kg)     Health Maintenance Due  Topic Date Due   COVID-19 Vaccine (3 - Booster for Pfizer series) 07/05/2019   TETANUS/TDAP  02/25/2021    There are no preventive care reminders to display for this patient.  Lab Results  Component Value Date   TSH 2.120 05/09/2016   Lab Results  Component Value Date   WBC 6.7 01/01/2021   HGB 11.0 (L) 01/01/2021   HCT 34.3 (L) 01/01/2021   MCV 80.9 01/01/2021   PLT 356.0 01/01/2021   Lab Results  Component Value Date   NA 138 01/01/2021   K 3.8 01/01/2021   CO2 27 01/01/2021   GLUCOSE 79 01/01/2021   BUN 11 01/01/2021   CREATININE 0.73 01/01/2021   BILITOT 0.4 12/21/2016   ALKPHOS 53 12/21/2016   AST 15 12/21/2016   ALT 21 12/21/2016   PROT 7.3 12/21/2016   ALBUMIN 4.2 12/21/2016   CALCIUM 8.9 01/01/2021   ANIONGAP 10 05/25/2020   GFR 102.26 01/01/2021   No results found for: HGBA1C    Assessment & Plan:   Problem List Items Addressed This Visit       Respiratory   Bronchitis    Take antibiotic as prescribed. Increase oral fluids. Pt to f/u if sx worsen and or fail to improve in 2-3 days.  Also sent Medrol Dosepak for cough and chest tightness       Relevant Medications   amoxicillin-clavulanate (AUGMENTIN) 875-125 MG tablet   methylPREDNISolone (MEDROL DOSEPAK) 4 MG TBPK tablet     Other   Suspected COVID-19 virus infection - Primary    POCT COVID ordered negative in office      Relevant Orders   POC COVID-19 BinaxNow (Completed)    Meds ordered this encounter  Medications   amoxicillin-clavulanate (AUGMENTIN) 875-125 MG tablet    Sig: Take 1 tablet by mouth 2 (two) times daily.    Dispense:  20 tablet    Refill:  0    Order Specific Question:   Supervising Provider    Answer:   BEDSOLE, AMY E [[id number]]   methylPREDNISolone (MEDROL DOSEPAK) 4 MG TBPK tablet    Sig: Take per package instructions    Dispense:  21 tablet     Refill:  0  Order Specific Question:   Supervising Provider  °  Answer:   BEDSOLE, AMY E [[id number]]  ° ° °Follow-up: Return if symptoms worsen or fail to improve.  ° ° °Julie-Anne Torain, FNP °

## 2021-03-11 NOTE — Patient Instructions (Signed)
Antibiotic sent to preferred pharmacy.   Please increase oral fluids, steamy hot shower/humidifier prn.  Please follow up if no improvement in 2-3 days.   It was a pleasure seeing you today! Please do not hesitate to reach out with any questions and or concerns.  Regards,   Quinnlan Abruzzo   

## 2021-03-16 ENCOUNTER — Ambulatory Visit: Payer: Medicaid Other | Admitting: Internal Medicine

## 2021-05-04 ENCOUNTER — Encounter: Payer: Self-pay | Admitting: Family Medicine

## 2021-06-23 ENCOUNTER — Emergency Department
Admission: EM | Admit: 2021-06-23 | Discharge: 2021-06-23 | Disposition: A | Discharge status: Home / Self Care | Payer: Medicaid Other | Attending: Emergency Medicine | Admitting: Emergency Medicine

## 2021-06-23 ENCOUNTER — Other Ambulatory Visit: Payer: Self-pay | Admitting: Family Medicine

## 2021-06-23 ENCOUNTER — Encounter: Payer: Self-pay | Admitting: Emergency Medicine

## 2021-06-23 ENCOUNTER — Emergency Department: Payer: Medicaid Other

## 2021-06-23 ENCOUNTER — Other Ambulatory Visit: Payer: Self-pay

## 2021-06-23 DIAGNOSIS — R519 Headache, unspecified: Secondary | ICD-10-CM | POA: Diagnosis not present

## 2021-06-23 DIAGNOSIS — R079 Chest pain, unspecified: Secondary | ICD-10-CM | POA: Diagnosis not present

## 2021-06-23 DIAGNOSIS — A084 Viral intestinal infection, unspecified: Secondary | ICD-10-CM

## 2021-06-23 DIAGNOSIS — D649 Anemia, unspecified: Secondary | ICD-10-CM

## 2021-06-23 DIAGNOSIS — B349 Viral infection, unspecified: Secondary | ICD-10-CM | POA: Diagnosis not present

## 2021-06-23 DIAGNOSIS — E860 Dehydration: Secondary | ICD-10-CM | POA: Insufficient documentation

## 2021-06-23 DIAGNOSIS — R000 Tachycardia, unspecified: Secondary | ICD-10-CM | POA: Diagnosis not present

## 2021-06-23 DIAGNOSIS — R197 Diarrhea, unspecified: Secondary | ICD-10-CM | POA: Diagnosis present

## 2021-06-23 DIAGNOSIS — R7989 Other specified abnormal findings of blood chemistry: Secondary | ICD-10-CM

## 2021-06-23 LAB — D-DIMER, QUANTITATIVE: D-Dimer, Quant: 0.46 ug/mL-FEU (ref 0.00–0.50)

## 2021-06-23 LAB — BASIC METABOLIC PANEL
Anion gap: 8 (ref 5–15)
BUN: 12 mg/dL (ref 6–20)
CO2: 26 mmol/L (ref 22–32)
Calcium: 8.7 mg/dL — ABNORMAL LOW (ref 8.9–10.3)
Chloride: 103 mmol/L (ref 98–111)
Creatinine, Ser: 0.78 mg/dL (ref 0.44–1.00)
GFR, Estimated: >60 mL/min (ref >60)
Glucose, Bld: 130 mg/dL — ABNORMAL HIGH (ref 70–99)
Potassium: 3.8 mmol/L (ref 3.5–5.1)
Sodium: 137 mmol/L (ref 135–145)

## 2021-06-23 LAB — HEPATIC FUNCTION PANEL
ALT: 173 U/L — ABNORMAL HIGH (ref 0–44)
AST: 165 U/L — ABNORMAL HIGH (ref 15–41)
Albumin: 3.6 g/dL (ref 3.5–5.0)
Alkaline Phosphatase: 92 U/L (ref 38–126)
Bilirubin, Direct: 0.2 mg/dL (ref 0.0–0.2)
Indirect Bilirubin: 0.7 mg/dL (ref 0.3–0.9)
Total Bilirubin: 0.9 mg/dL (ref 0.3–1.2)
Total Protein: 6.8 g/dL (ref 6.5–8.1)

## 2021-06-23 LAB — CBC
HCT: 34.5 % — ABNORMAL LOW (ref 36.0–46.0)
Hemoglobin: 10.6 g/dL — ABNORMAL LOW (ref 12.0–15.0)
MCH: 25.1 pg — ABNORMAL LOW (ref 26.0–34.0)
MCHC: 30.7 g/dL (ref 30.0–36.0)
MCV: 81.6 fL (ref 80.0–100.0)
Platelets: 314 10*3/uL (ref 150–400)
RBC: 4.23 MIL/uL (ref 3.87–5.11)
RDW: 14.3 % (ref 11.5–15.5)
WBC: 10.1 10*3/uL (ref 4.0–10.5)
nRBC: 0 % (ref 0.0–0.2)

## 2021-06-23 LAB — LIPASE, BLOOD: Lipase: 28 U/L (ref 11–51)

## 2021-06-23 LAB — TROPONIN I (HIGH SENSITIVITY): Troponin I (High Sensitivity): 5 ng/L (ref <18)

## 2021-06-23 MED ORDER — KETOROLAC TROMETHAMINE 15 MG/ML IJ SOLN
15.0000 mg | Freq: Once | INTRAMUSCULAR | Status: AC
  Administered 2021-06-23: 15 mg via INTRAVENOUS
  Filled 2021-06-23: qty 1

## 2021-06-23 MED ORDER — ONDANSETRON 4 MG PO TBDP: 4.0000 mg | ORAL_TABLET | Freq: Three times a day (TID) | ORAL | 0 refills | Status: DC | PRN

## 2021-06-23 MED ORDER — ONDANSETRON HCL 4 MG/2ML IJ SOLN
4.0000 mg | Freq: Once | INTRAMUSCULAR | Status: AC
  Administered 2021-06-23: 4 mg via INTRAVENOUS
  Filled 2021-06-23: qty 2

## 2021-06-23 MED ORDER — FAMOTIDINE 20 MG PO TABS: 20.0000 mg | ORAL_TABLET | Freq: Two times a day (BID) | ORAL | 0 refills | Status: DC

## 2021-06-23 MED ORDER — PANTOPRAZOLE SODIUM 40 MG IV SOLR
40.0000 mg | Freq: Once | INTRAVENOUS | Status: AC
  Administered 2021-06-23: 40 mg via INTRAVENOUS
  Filled 2021-06-23: qty 10

## 2021-06-23 MED ORDER — LACTATED RINGERS IV BOLUS
1000.0000 mL | Freq: Once | INTRAVENOUS | Status: AC
  Administered 2021-06-23: 1000 mL via INTRAVENOUS

## 2021-06-23 NOTE — Progress Notes (Signed)
Please call pt.  I saw her ER note and I would expect gradual improvement.  If she is feeling worse then she needs recheck.  If feeling better, then reasonable to recheck labs in about 2 weeks.  I put in the f/u orders.  Doesn't need to fast.  Thanks.

## 2021-06-23 NOTE — Discharge Instructions (Signed)
Your lab tests today are reassuring.  Continue taking nausea medicine as needed and focus on drinking lots of fluids to stay well-hydrated.

## 2021-06-23 NOTE — ED Provider Notes (Signed)
[facility] Provider Note    Event Date/Time   First MD Initiated Contact with Patient 06/23/21 [phone]     (approximate)   History   Chief Complaint: Chest Pain   HPI  Jessica Barron is a 41 y.o. female with a history of Raynaud's, constipation, PCOS who comes the ED complaining of body aches, headache, nausea vomiting and diarrhea for the past 4 days after eating dinner at [employer] on an out-of-town camping trip.  Patient reports that after initial vomiting, she has had persistent diarrhea.  Other family members have also had diarrhea since eating at the [facility].     Physical Exam   Triage Vital Signs: ED Triage Vitals  Enc Vitals Group     BP 06/23/21 0517 119/79     Pulse Rate 06/23/21 0517 (!) 121     Resp 06/23/21 0517 18     Temp 06/23/21 0517 98.6 F (37 C)     Temp Source 06/23/21 0517 Oral     SpO2 06/23/21 0517 96 %     Weight 06/23/21 0516 213 lb 13.5 oz (97 kg)     Height 06/23/21 0516 5\' 2"  (1.575 m)     Head Circumference --      Peak Flow --      Pain Score 06/23/21 0515 3     Pain Loc --      Pain Edu? --      Excl. in GC? --     Most recent vital signs: Vitals:   06/23/21 0517 06/23/21 0734  BP: 119/79 (!) 113/59  Pulse: (!) 121 (!) 106  Resp: 18 17  Temp: 98.6 F (37 C)   SpO2: 96% 94%    General: Awake, no distress.  CV:  Good peripheral perfusion.  Regular rate and rhythm Resp:  Normal effort.  Clear to auscultation bilaterally Abd:  No distention.  Soft and nontender Other:  No rash, no lower extremity edema.   ED Results / Procedures / Treatments   Labs (all labs ordered are listed, but only abnormal results are displayed) Labs Reviewed  BASIC METABOLIC PANEL - Abnormal; Notable for the following components:      Result Value   Glucose, Bld 130 (*)    Calcium 8.7 (*)    All other components within normal limits  CBC - Abnormal; Notable for the following components:   Hemoglobin 10.6 (*)    HCT  34.5 (*)    MCH 25.1 (*)    All other components within normal limits  HEPATIC FUNCTION PANEL - Abnormal; Notable for the following components:   AST 165 (*)    ALT 173 (*)    All other components within normal limits  D-DIMER, QUANTITATIVE  LIPASE, BLOOD  POC URINE PREG, ED  TROPONIN I (HIGH SENSITIVITY)     EKG Interpreted by me Sinus tachycardia rate 122.  Normal axis and intervals.  Normal QRS ST segments and T waves.  No ischemic changes.   RADIOLOGY Chest x-ray viewed and interpreted by me, appears normal.  Radiology report reviewed   PROCEDURES:  Procedures   MEDICATIONS ORDERED IN ED: Medications  lactated ringers bolus 1,000 mL (1,000 mLs Intravenous New Bag/Given 06/23/21 0802)  ondansetron (ZOFRAN) injection 4 mg (4 mg Intravenous Given 06/23/21 0759)  ketorolac (TORADOL) 15 MG/ML injection 15 mg (15 mg Intravenous Given 06/23/21 0800)  pantoprazole (PROTONIX) injection 40 mg (40 mg Intravenous Given 06/23/21 0757)     IMPRESSION / MDM /  ASSESSMENT AND PLAN / ED COURSE  I reviewed the triage vital signs and the nursing notes.                              Differential diagnosis includes, but is not limited to, pancreatitis, CBD obstruction, dehydration, gastritis, viral syndrome, renal insufficiency, electrolyte abnormality  Patient's presentation is most consistent with acute complicated illness / injury requiring diagnostic workup.  Patient presents with syndrome consistent with viral gastroenteritis, foodborne illness.  With the prolonged course of symptoms and difficulty with oral intake, labs are necessary which are reassuring.  She has some mild elevation of transaminases which I think is viral.  Total bilirubin and lipase are normal, doubt choledocholithiasis/common bile duct obstruction.  She is nontoxic, not septic, feeling better after IV fluids Zofran Toradol and Protonix.  On reassessment her heart rate is 90, she feels much better and request discharge  home.   Considering the patient's symptoms, medical history, and physical examination today, I have low suspicion for biliary pathology, pancreatitis, perforation or bowel obstruction, hernia, intra-abdominal abscess, AAA or dissection, volvulus or intussusception, mesenteric ischemia, or appendicitis.        FINAL CLINICAL IMPRESSION(S) / ED DIAGNOSES   Final diagnoses:  Viral gastroenteritis  Dehydration     Rx / DC Orders   ED Discharge Orders          Ordered    ondansetron (ZOFRAN-ODT) 4 MG disintegrating tablet  Every 8 hours PRN        06/23/21 0903    famotidine (PEPCID) 20 MG tablet  2 times daily        06/23/21 6203             Note:  This document was prepared using Dragon voice recognition software and may include unintentional dictation errors.   Sharman Cheek, MD 06/23/21 [phone]

## 2021-06-23 NOTE — ED Triage Notes (Signed)
Pt presents via POV with complaints of CP and headache. She notes eating something "bad" over the weekend and had some N/V. She notes the CP is dull and is midsternal and it radiates to her back. Of note, pt takes antibiotics daily but D/C over the weekend due to feeling poorly. Denies SOB.

## 2021-06-23 NOTE — ED Notes (Signed)
Pt c/o N/V on Friday and since Saturday having diarrhea non stop. Pt c/o having pain in her upper back and was concerned about cardiac issues. Pt is in NAD at present.

## 2021-06-24 NOTE — Progress Notes (Signed)
Spoke with patient and she is doing better. Lab appt made for 07/15/21 at 3:45 pm.

## 2021-07-15 ENCOUNTER — Other Ambulatory Visit: Payer: Medicaid Other

## 2021-08-26 ENCOUNTER — Telehealth: Payer: Self-pay | Admitting: Family Medicine

## 2021-08-26 ENCOUNTER — Other Ambulatory Visit: Payer: Self-pay

## 2021-08-26 ENCOUNTER — Emergency Department: Payer: Medicaid Other

## 2021-08-26 ENCOUNTER — Emergency Department
Admission: EM | Admit: 2021-08-26 | Discharge: 2021-08-26 | Disposition: A | Discharge status: Home / Self Care | Payer: Medicaid Other | Attending: Emergency Medicine | Admitting: Emergency Medicine

## 2021-08-26 DIAGNOSIS — R61 Generalized hyperhidrosis: Secondary | ICD-10-CM | POA: Diagnosis not present

## 2021-08-26 DIAGNOSIS — R531 Weakness: Secondary | ICD-10-CM | POA: Insufficient documentation

## 2021-08-26 DIAGNOSIS — R0789 Other chest pain: Secondary | ICD-10-CM | POA: Insufficient documentation

## 2021-08-26 DIAGNOSIS — R079 Chest pain, unspecified: Secondary | ICD-10-CM

## 2021-08-26 DIAGNOSIS — R110 Nausea: Secondary | ICD-10-CM | POA: Diagnosis not present

## 2021-08-26 LAB — BASIC METABOLIC PANEL
Anion gap: 7 (ref 5–15)
BUN: 11 mg/dL (ref 6–20)
CO2: 25 mmol/L (ref 22–32)
Calcium: 9 mg/dL (ref 8.9–10.3)
Chloride: 106 mmol/L (ref 98–111)
Creatinine, Ser: 0.84 mg/dL (ref 0.44–1.00)
GFR, Estimated: >60 mL/min (ref >60)
Glucose, Bld: 91 mg/dL (ref 70–99)
Potassium: 3.6 mmol/L (ref 3.5–5.1)
Sodium: 138 mmol/L (ref 135–145)

## 2021-08-26 LAB — POC URINE PREG, ED: Preg Test, Ur: NEGATIVE

## 2021-08-26 LAB — CBC
HCT: 34.3 % — ABNORMAL LOW (ref 36.0–46.0)
Hemoglobin: 10.2 g/dL — ABNORMAL LOW (ref 12.0–15.0)
MCH: 23.5 pg — ABNORMAL LOW (ref 26.0–34.0)
MCHC: 29.7 g/dL — ABNORMAL LOW (ref 30.0–36.0)
MCV: 79 fL — ABNORMAL LOW (ref 80.0–100.0)
Platelets: 405 10*3/uL — ABNORMAL HIGH (ref 150–400)
RBC: 4.34 MIL/uL (ref 3.87–5.11)
RDW: 15 % (ref 11.5–15.5)
WBC: 9.8 10*3/uL (ref 4.0–10.5)
nRBC: 0 % (ref 0.0–0.2)

## 2021-08-26 LAB — TROPONIN I (HIGH SENSITIVITY)
Troponin I (High Sensitivity): <2 ng/L (ref <18)
Troponin I (High Sensitivity): 3 ng/L (ref <18)

## 2021-08-26 NOTE — Telephone Encounter (Signed)
Patient called in stating she experienced an episode at work. She got hit with strong indigestion, cold sweats, and nausea, and felt like she was going to faint. She took two 81 mg aspirin and sat down and had her blood pressure taking, the first time was 184/120 and the second 160/120. She is now home and its reading 111/75. Heart rate was between 70-80 but BP was high. Sent over to triage with access nurse.

## 2021-08-26 NOTE — ED Notes (Signed)
ED Provider at bedside. 

## 2021-08-26 NOTE — ED Provider Notes (Signed)
[facility] Provider Note    Event Date/Time   First MD Initiated Contact with Patient 08/26/21 2100     (approximate)  History   Chief Complaint: Chest Pain and Emesis  HPI  Jessica Barron is a 41 y.o. female with no significant past medical history presents to the emergency department for chest discomfort earlier today.  According to the patient earlier today she had a sensation of indigestion in the middle of the chest.  Patient states she has had this before but this time she states she felt very weak almost like she was going to pass out.  Patient states her symptoms lasted less than 10 or 15 minutes and completely resolved.  Patient denies any chest discomfort currently.  Denies any shortness of breath.  Denies any nausea but did state some diaphoresis at the time.  Currently patient states she feels well.  Physical Exam   Triage Vital Signs: ED Triage Vitals  Enc Vitals Group     BP 08/26/21 1833 134/85     Pulse Rate 08/26/21 1833 78     Resp 08/26/21 1833 18     Temp 08/26/21 1833 98.7 F (37.1 C)     Temp Source 08/26/21 1833 Oral     SpO2 08/26/21 1833 100 %     Weight 08/26/21 1837 217 lb (98.4 kg)     Height 08/26/21 1837 5\' 2"  (1.575 m)     Head Circumference --      Peak Flow --      Pain Score 08/26/21 1837 0     Pain Loc --      Pain Edu? --      Excl. in GC? --     Most recent vital signs: Vitals:   08/26/21 1833  BP: 134/85  Pulse: 78  Resp: 18  Temp: 98.7 F (37.1 C)  SpO2: 100%    General: Awake, no distress.  CV:  Good peripheral perfusion.  Regular rate and rhythm  Resp:  Normal effort.  Equal breath sounds bilaterally.  Abd:  No distention.  Soft, nontender.  No rebound or guarding.  Benign abdomen.    ED Results / Procedures / Treatments   EKG  EKG viewed and interpreted by myself shows a normal sinus rhythm at 72 bpm with a narrow QRS, normal axis, normal intervals, nonspecific but no concerning ST  changes.  RADIOLOGY  I have reviewed and interpreted the chest x-ray images no findings on my evaluation. Radiology has read the x-ray as negative   MEDICATIONS ORDERED IN ED: Medications - No data to display   IMPRESSION / MDM / ASSESSMENT AND PLAN / ED COURSE  I reviewed the triage vital signs and the nursing notes.  Patient's presentation is most consistent with acute presentation with potential threat to life or bodily function.  Patient presents to the emergency department for chest discomfort and weakness.  Patient states she felt like she might pass out.  Patient denies LOC.  States symptoms only lasted several minutes and now have resolved.  Denies any pain currently.  No shortness of breath no nausea.  Overall the patient appears very well, reassuring vitals reassuring physical exam, benign abdomen, denies any chest pain or any complaints of all currently.  Patient is EKG is reassuring, chest x-ray is clear, chemistry is normal CBC is normal and troponin is negative.  Symptoms started around 2 PM.  We will recheck a troponin as a precaution.  Patient agreeable to plan  of care.  Repeat troponin remains normal/unchanged.  Patient continues to feel well is requesting discharge home as she is working morning.  Patient will be discharged with PCP follow-up.  I will refer also to cardiology for consideration of a Holter monitor.  FINAL CLINICAL IMPRESSION(S) / ED DIAGNOSES   Chest pain  Note:  This document was prepared using Dragon voice recognition software and may include unintentional dictation errors.   Minna Antis, MD 08/26/21 2127

## 2021-08-26 NOTE — Telephone Encounter (Addendum)
Unable to get pt to answer phone; Lakeia at front desk said pt is probably talking to access nurse because she just tranferred pt few mins ago. Will try again in few mins. Sending note to DR Para March so he will be aware.still trying to reach pt and no one on DPR. Pt called back and I spoke with pt and she said she had just spoken with access nurse and was advised to go to ED. Pt said she still does not feel right; not as bad as earlier but still not right. Pts fiancee is taking her to [facility] ED now. Will attach access not when available. Sending note to Dr Para March and Shanda Bumps CMA. And will let DR Para March also know now.      [facility] Day - Client TELEPHONE ADVICE RECORD AccessNurse Patient Name: Jessica Barron Gender: Female DOB: 01-19-1981 Age: 41 Y 10 M 29 D Return Phone Number: [phone] (Primary) Address: City/ State/ Zip: [place] [address] Client [facility] Day - Client Client Site [facility] [place] - Day Provider Raechel Ache - MD Contact Type Call Who Is Calling Patient / Member / Family / Caregiver Call Type Triage / Clinical Caller Name [facility] Relationship To Patient Care Giver Return Phone Number [phone] (Primary) Chief Complaint BLOOD PRESSURE HIGH - Diastolic (bottom number) 120 or greater Reason for Call Symptomatic / Request for Health Information Initial Comment Caller states she is calling from [facility], she has a pt that has indigestion, cold sweats, nausea. Blood pressure is 184/120, 160/120, 111/75. Heart rate between 70-80 Translation No Nurse Assessment Nurse: Daphine Deutscher, RN, Melanie Date/Time (Eastern Time): 08/26/2021 4:39:51 PM Confirm and document reason for call. If symptomatic, describe symptoms. ---Caller states she has BP 111/75 and she was at her job and got hit with really bad indigestion and cold sweats and nausea that hit while she was talking to her boss. this has  resolved now but caller wonders what she should do to Follow up with this? BP was 180/120 during this episode. Her chest burned and ached during this episode also. Does the patient have any new or worsening symptoms? ---Yes Will a triage be completed? ---Yes Related visit to physician within the last 2 weeks? ---No Does the PT have any chronic conditions? (i.e. diabetes, asthma, this includes High risk factors for pregnancy, etc.) ---No Is the patient pregnant or possibly pregnant? (Ask all females between the ages of 56-55) ---Yes What is the estimated delivery date? ---0001-01-01 Total number of pregnancies including current? ---2 Number of live births? ---1 Have you felt decreased fetal movement? ---Early Pregnancy - No Fetal Movement Felt Yet Is this a behavioral health or substance abuse call? ---No PLEASE NOTE: All timestamps contained within this report are represented as [place] Standard Time. CONFIDENTIALTY NOTICE: This fax transmission is intended only for the addressee. It contains information that is legally privileged, confidential or otherwise protected from use or disclosure. If you are not the intended recipient, you are strictly prohibited from reviewing, disclosing, copying using or disseminating any of this information or taking any action in reliance on or regarding this information. If you have received this fax in error, please notify us immediately by telephone so that we can arrange for its return to [place]. Phone: [phone], Toll-Free: [phone], Fax: [phone] Page: 2 of 2 Call Id: [id number] Guidelines Guideline Title Affirmed Question Affirmed Notes Nurse Date/Time Lamount Cohen Time) Chest Pain SEVERE chest pain Andrey Cota, Shawna Orleans 08/26/2021  4:48:11 PM Disp. Time Lamount Cohen Time) Disposition Final User 08/26/2021 4:36:13 PM Send to Urgent Lars Masson 08/26/2021 4:55:37 PM Go to ED Now Yes Daphine Deutscher, RN, Melanie Final Disposition 08/26/2021 4:55:37 PM Go to  ED Now Yes Daphine Deutscher, RN, Mittie Bodo Disagree/Comply Comply Caller Understands Yes PreDisposition Call Doctor Care Advice Given Per Guideline GO TO ED NOW: * You need to be seen in the Emergency Department. BRING MEDICINES: * Bring a list of your current medicines when you go to the Emergency Department (ER). NOTHING BY MOUTH: * Do not eat or drink anything for now. CALL EMS IF: * Severe difficulty breathing occurs * Passes out or becomes too weak to stand * You become worse CARE ADVICE given per Chest Pain (Adult) guideline. Referrals [facility] - E

## 2021-08-26 NOTE — ED Notes (Signed)
Pt asking to be d/c'd and states, "I have to work tomorrow and I just have this gut feeling everything is going to be fine." ER provider notified.

## 2021-08-26 NOTE — ED Triage Notes (Signed)
Pt c/o sudden onset of central chest pain/burning and nausea around 1500.  Pt reports episode lasted around .  Currently denies pain.  Pt reports she took vitals during episode and her BP was elevated.

## 2021-08-26 NOTE — Telephone Encounter (Signed)
Please triage patient and advice eval today if possible.

## 2021-08-27 NOTE — Telephone Encounter (Signed)
Noted. Thanks. See ER report.

## 2021-09-06 ENCOUNTER — Encounter: Payer: Self-pay | Admitting: Family Medicine

## 2021-09-12 ENCOUNTER — Other Ambulatory Visit: Payer: Self-pay | Admitting: Family Medicine

## 2021-09-12 DIAGNOSIS — N920 Excessive and frequent menstruation with regular cycle: Secondary | ICD-10-CM

## 2021-09-15 ENCOUNTER — Ambulatory Visit (INDEPENDENT_AMBULATORY_CARE_PROVIDER_SITE_OTHER): Payer: Medicaid Other | Admitting: Obstetrics and Gynecology

## 2021-09-15 ENCOUNTER — Encounter: Payer: Self-pay | Admitting: Obstetrics and Gynecology

## 2021-09-15 VITALS — BP 130/83 | HR 89 | Ht 62.0 in | Wt 214.1 lb

## 2021-09-15 DIAGNOSIS — D500 Iron deficiency anemia secondary to blood loss (chronic): Secondary | ICD-10-CM | POA: Diagnosis not present

## 2021-09-15 DIAGNOSIS — N920 Excessive and frequent menstruation with regular cycle: Secondary | ICD-10-CM | POA: Diagnosis not present

## 2021-09-15 DIAGNOSIS — Z7689 Persons encountering health services in other specified circumstances: Secondary | ICD-10-CM

## 2021-09-15 NOTE — Progress Notes (Signed)
Pt complaining of menorrhagia symptoms with Hx of blood work that came back as normal labs but still experiencing major fatigue and patient has concerns she is anemic. Pt c/o irregular painful heavy periods.

## 2021-09-15 NOTE — Progress Notes (Addendum)
HPI:      Jessica Barron is a 41 y.o. G1P1001 who LMP was Patient's last menstrual period was 09/12/2021 (exact date).  Subjective:   She presents today after being seen recently in the emergency department for heavy vaginal bleeding.  She had 2 periods last month.  She describes her menses as very heavy with "flooding".  She was found to be anemic in the emergency department.  Hemoglobin of 10. She is concerned that she may have endometriosis or uterine fibroids. She is also considering a pregnancy.  Although she and her husband have not yet decided.  They have been having unprotected intercourse without resultant pregnancy but have not pursued infertility work-up.  Jessica Barron has asked that the following be amended into her chart. Current incorrect information:    Statement that I was seen in the ES for heavy vaginal bleeding. I was not.      Statement about deciding what is important; pregnancy or cycle regulation. I made no mention of wanting to become pregnant, this became a topic because I'm not on birth control. I've simply found it unnecessary at this phase in my life. A chemical pregnancy did happen but that has been over a year ago and we both have known fertility issues.     Chart correction requested:    I was not seen in the ER for heavy vaginal bleeding. I was not on any cycle at that time and was seen for what seemed like a digestive/possible cardiac issue that checked out okay in the ER but at the time of the episode almost put me on the floor, as in I was about to pass out. It uncovered the anemia      Pregnancy statement: I have been avoiding birth control because it makes me feel like crap. It wasn't because we are trying to get pregnant. I just want to know why I am bleeding so heavy, why I'm bleeding again as of the 20th, when I had my period for August on the 12th, this is abnormal for me, and when I had my labs done in the ER with a level of "10" I had not been bleeding  at that time. I am now clearly deficient and it's making me feel horrible and I just want to rule out serious causes for the bleeding/deficiency. I am unconcerned with fertility. I came in expecting help with my deficiency not with getting pregnant.        ----- Message -----  From: Jessica Barron  Sent: 09/16/2021   8:45 AM EDT      Hx: The following portions of the patient's history were reviewed and updated as appropriate:             She  has a past medical history of Abnormal Pap smear, COVID-19 (02/07/2020), Depression, GERD (gastroesophageal reflux disease), and History of PCOS. She does not have any pertinent problems on file. She  has a past surgical history that includes Cholecystectomy and Cesarean section (02/25/2011). Her family history includes Bladder Cancer in her father and mother; Breast cancer in her paternal grandmother; COPD in her mother; Cancer in her father and mother. She  reports that she has never smoked. She has never used smokeless tobacco. She reports current alcohol use. She reports that she does not use drugs. She has a current medication list which includes the following prescription(s): amoxicillin-clavulanate, doxycycline, famotidine, methylprednisolone, ondansetron, [DISCONTINUED] spironolactone, and [DISCONTINUED] venlafaxine xr. She is allergic to lexapro [escitalopram oxalate] and  wellbutrin [bupropion].       Review of Systems:  Review of Systems  Constitutional: Denied constitutional symptoms, night sweats, recent illness, fatigue, fever, insomnia and weight loss.  Eyes: Denied eye symptoms, eye pain, photophobia, vision change and visual disturbance.  Ears/Nose/Throat/Neck: Denied ear, nose, throat or neck symptoms, hearing loss, nasal discharge, sinus congestion and sore throat.  Cardiovascular: Denied cardiovascular symptoms, arrhythmia, chest pain/pressure, edema, exercise intolerance, orthopnea and palpitations.  Respiratory: Denied pulmonary  symptoms, asthma, pleuritic pain, productive sputum, cough, dyspnea and wheezing.  Gastrointestinal: Denied, gastro-esophageal reflux, melena, nausea and vomiting.  Genitourinary: See HPI for additional information.  Musculoskeletal: Denied musculoskeletal symptoms, stiffness, swelling, muscle weakness and myalgia.  Dermatologic: Denied dermatology symptoms, rash and scar.  Neurologic: Denied neurology symptoms, dizziness, headache, neck pain and syncope.  Psychiatric: Denied psychiatric symptoms, anxiety and depression.  Endocrine: Denied endocrine symptoms including hot flashes and night sweats.   Meds:   Current Outpatient Medications on File Prior to Visit  Medication Sig Dispense Refill   amoxicillin-clavulanate (AUGMENTIN) 875-125 MG tablet Take 1 tablet by mouth 2 (two) times daily. 20 tablet 0   doxycycline (VIBRA-TABS) 100 MG tablet Take 0.5 tablets (50 mg total) by mouth 2 (two) times daily. 30 tablet 3   famotidine (PEPCID) 20 MG tablet Take 1 tablet (20 mg total) by mouth 2 (two) times daily. 60 tablet 0   methylPREDNISolone (MEDROL DOSEPAK) 4 MG TBPK tablet Take per package instructions 21 tablet 0   ondansetron (ZOFRAN-ODT) 4 MG disintegrating tablet Take 1 tablet (4 mg total) by mouth every 8 (eight) hours as needed for nausea or vomiting. 20 tablet 0   [DISCONTINUED] spironolactone (ALDACTONE) 100 MG tablet  (Patient not taking: Reported on 05/21/2020)     [DISCONTINUED] venlafaxine XR (EFFEXOR XR) 37.5 MG 24 hr capsule Take 1 capsule (37.5 mg total) by mouth daily with breakfast. (Patient not taking: Reported on 05/21/2020) 90 capsule 3   No current facility-administered medications on file prior to visit.      Objective:     Vitals:   09/15/21 1435  BP: 130/83  Pulse: 89   Filed Weights   09/15/21 1435  Weight: 214 lb 1.6 oz (97.1 kg)                        Assessment:    G1P1001 Patient Active Problem List   Diagnosis Date Noted   Suspected COVID-19  virus infection 03/11/2021   Bronchitis 03/11/2021   Raynaud's phenomenon 01/03/2021   Hip pain 01/03/2021   Rosacea 01/01/2021   Advance care planning 10/24/2018   Dysuria 06/04/2018   Constipation 02/18/2018   FH: bladder cancer 02/18/2018   Elevated BP without diagnosis of hypertension 02/11/2016   Hyperlipidemia 02/11/2016   PCOS (polycystic ovarian syndrome) 09/11/2013     1. Establishing care with new doctor, encounter for   2. Menorrhagia with regular cycle   3. Chronic blood loss anemia        Plan:            1.  Pelvic ultrasound to rule out uterine fibroids or other pelvic abnormality  2.  Recommend oral iron -she says she has tried this and it made her have diarrhea.  3.  I have discussed cycle control methods in detail but have also explained to her that all of the cycle control methods are birth control so she must first decide what is the most important at this time in her life.  After her ultrasound results return she will inform me regarding her decision regarding attempts at cycle control versus investigation of infertility.  Orders Orders Placed This Encounter  Procedures   US PELVIS (TRANSABDOMINAL ONLY)   US PELVIS TRANSVAGINAL NON-OB (TV ONLY)    No orders of the defined types were placed in this encounter.     F/U  Return for We will contact her with any abnormal test results. I spent 32 minutes involved in the care of this patient preparing to see the patient by obtaining and reviewing her medical history (including labs, imaging tests and prior procedures), documenting clinical information in the electronic health record (EHR), counseling and coordinating care plans, writing and sending prescriptions, ordering tests or procedures and in direct communicating with the patient and medical staff discussing pertinent items from her history and physical exam.  Elonda Husky, M.D. 09/15/2021 4:01 PM  In Sept after reading the above note, the patient has  requested the following chart amendment be made.  She feels it would be more appropriate if this following summary by her was included in the record as corrected information and a better summary of her issues.  "This patient has requested a chart correction to a visit summary.  Visit summary to change: [facility]  Visit date: Wed 09/15/2021  Current incorrect information:  Statement that I was seen in the ES for heavy vaginal bleeding. I was not.     Statement about deciding what is important; pregnancy or cycle regulation. I made no mention of wanting to become pregnant, this became a topic because I'm not on birth control. I've simply found it unnecessary at this phase in my life. A chemical pregnancy did happen but that has been over a year ago and we both have known fertility issues.  Chart correction requested:  I was not seen in the ER for heavy vaginal bleeding. I was not on any cycle at that time and was seen for what seemed like a digestive/possible cardiac issue that checked out okay in the ER but at the time of the episode almost put me on the floor, as in I was about to pass out. It uncovered the anemia     Pregnancy statement: I have been avoiding birth control because it makes me feel like crap. It wasn't because we are trying to get pregnant. I just want to know why I am bleeding so heavy, why I'm bleeding again as of the 20th, when I had my period for August on the 12th, this is abnormal for me, and when I had my labs done in the ER with a level of "10" I had not been bleeding at that time. I am now clearly deficient and it's making me feel horrible and I just want to rule out serious causes for the bleeding/deficiency. I am unconcerned with fertility. I came in expecting help with my deficiency not with getting pregnant. "

## 2021-09-16 ENCOUNTER — Other Ambulatory Visit (INDEPENDENT_AMBULATORY_CARE_PROVIDER_SITE_OTHER): Payer: Medicaid Other

## 2021-09-16 DIAGNOSIS — R7989 Other specified abnormal findings of blood chemistry: Secondary | ICD-10-CM

## 2021-09-16 DIAGNOSIS — D649 Anemia, unspecified: Secondary | ICD-10-CM | POA: Diagnosis not present

## 2021-09-17 ENCOUNTER — Telehealth: Payer: Self-pay

## 2021-09-17 ENCOUNTER — Telehealth: Payer: Self-pay | Admitting: Family Medicine

## 2021-09-17 ENCOUNTER — Other Ambulatory Visit: Payer: Medicaid Other

## 2021-09-17 LAB — CBC WITH DIFFERENTIAL/PLATELET
Basophils Absolute: 0.1 10*3/uL (ref 0.0–0.1)
Basophils Relative: 1 % (ref 0.0–3.0)
Eosinophils Absolute: 0.1 10*3/uL (ref 0.0–0.7)
Eosinophils Relative: 1 % (ref 0.0–5.0)
HCT: 32.1 % — ABNORMAL LOW (ref 36.0–46.0)
Hemoglobin: 10.3 g/dL — ABNORMAL LOW (ref 12.0–15.0)
Lymphocytes Relative: 25 % (ref 12.0–46.0)
Lymphs Abs: 1.7 10*3/uL (ref 0.7–4.0)
MCHC: 32.2 g/dL (ref 30.0–36.0)
MCV: 76.7 fl — ABNORMAL LOW (ref 78.0–100.0)
Monocytes Absolute: 0.4 10*3/uL (ref 0.1–1.0)
Monocytes Relative: 6.1 % (ref 3.0–12.0)
Neutro Abs: 4.5 10*3/uL (ref 1.4–7.7)
Neutrophils Relative %: 66.9 % (ref 43.0–77.0)
Platelets: 377 10*3/uL (ref 150.0–400.0)
RBC: 4.18 Mil/uL (ref 3.87–5.11)
RDW: 16.8 % — ABNORMAL HIGH (ref 11.5–15.5)
WBC: 6.7 10*3/uL (ref 4.0–10.5)

## 2021-09-17 LAB — IRON: Iron: 53 ug/dL (ref 42–145)

## 2021-09-17 LAB — HEPATIC FUNCTION PANEL
ALT: 17 U/L (ref 0–35)
AST: 19 U/L (ref 0–37)
Albumin: 4.4 g/dL (ref 3.5–5.2)
Alkaline Phosphatase: 40 U/L (ref 39–117)
Bilirubin, Direct: 0.1 mg/dL (ref 0.0–0.3)
Total Bilirubin: 0.7 mg/dL (ref 0.2–1.2)
Total Protein: 6.9 g/dL (ref 6.0–8.3)

## 2021-09-17 NOTE — Telephone Encounter (Signed)
Noted. Thanks. See result note.  

## 2021-09-17 NOTE — Telephone Encounter (Signed)
-----   Message from Joaquim Nam, MD sent at 09/17/2021  1:55 PM EDT ----- Please call pt.  HGB still slightly low but stable.  Iron normal.  LFTs normalized. . Overall good news.  Would follow up with gyn.  Thanks.

## 2021-09-17 NOTE — Telephone Encounter (Signed)
I left a message for the patient to return my call.

## 2021-09-17 NOTE — Telephone Encounter (Signed)
I spoke with pt; LMP was 09/04/21 and then pt started vaginal bleeding again on 09/12/21; pt saw Dr Brennan Bailey on 09/15/21 and pt said he talked with her and pt has US pelvis scheduled on 09/22/21. Pt said still bleeding heavily, has a full and crampy feeling in lower abd. Pt said like a dull achy pain across lower abd with pain level of 7. Pt said feeling very tired, and lightheaded where pt lost balance couple of times on 09/16/21. Pt did not fall but ran into things yesterday. Pt will call Dr Logan Bores office and UC & ED precautions given and pt voiced understanding. Pt request cb when Dr Para March gets lab results from 09/16/21.sending note to Dr Para March and Shanda Bumps CMA.

## 2021-09-17 NOTE — Telephone Encounter (Signed)
See my chart message about vaginal bleeding.  Please triage patient.  She likely needs follow-up with gynecology.  Thanks.

## 2021-09-20 NOTE — Telephone Encounter (Signed)
Spoke with patient regarding labs.

## 2021-09-22 ENCOUNTER — Other Ambulatory Visit: Payer: Medicaid Other

## 2021-10-31 ENCOUNTER — Encounter: Payer: Self-pay | Admitting: Family Medicine

## 2021-11-30 ENCOUNTER — Encounter: Payer: Self-pay | Admitting: Family Medicine

## 2021-12-01 ENCOUNTER — Other Ambulatory Visit: Payer: Self-pay | Admitting: Family Medicine

## 2021-12-01 DIAGNOSIS — L719 Rosacea, unspecified: Secondary | ICD-10-CM

## 2021-12-30 ENCOUNTER — Telehealth: Payer: Self-pay | Admitting: Obstetrics and Gynecology

## 2021-12-30 NOTE — Telephone Encounter (Signed)
I conacteed patient via phone. I left message for patient to call back to see if she wanted to rescheduled ultrasound appointment. Mychart message sent also.

## 2022-01-07 ENCOUNTER — Encounter: Payer: Self-pay | Admitting: Family Medicine

## 2022-01-15 ENCOUNTER — Other Ambulatory Visit: Payer: Self-pay | Admitting: Family Medicine

## 2022-01-25 ENCOUNTER — Ambulatory Visit: Payer: Medicaid Other | Admitting: Family Medicine

## 2022-01-25 ENCOUNTER — Encounter: Payer: Self-pay | Admitting: Family Medicine

## 2022-01-25 VITALS — BP 124/80 | HR 92 | Temp 97.9°F | Ht 62.0 in | Wt 224.0 lb

## 2022-01-25 DIAGNOSIS — L719 Rosacea, unspecified: Secondary | ICD-10-CM

## 2022-01-25 DIAGNOSIS — N920 Excessive and frequent menstruation with regular cycle: Secondary | ICD-10-CM | POA: Diagnosis not present

## 2022-01-25 MED ORDER — DOXYCYCLINE HYCLATE 100 MG PO TABS
50.0000 mg | ORAL_TABLET | Freq: Two times a day (BID) | ORAL | 1 refills | Status: DC
Start: 2022-01-25 — End: 2022-05-15

## 2022-01-25 MED ORDER — SPIRONOLACTONE 50 MG PO TABS: 25.0000 mg | ORAL_TABLET | Freq: Every day | ORAL | 1 refills | Status: DC

## 2022-01-25 NOTE — Progress Notes (Unsigned)
Discussed rosacea.  Off doxy in the meantime.  More facial rash.  Off spironolactone.  She felt worse off spironolactone- joint aches improved and weight loss noted on med.  Worse off med.    H/o anemia.  Still with heavy periods.  D/w pt about gyn f/u.  Pica with chewing ice.  She isn't lightheaded.  Recheck pulse 92.   Meds, vitals, and allergies reviewed.   ROS: Per HPI unless specifically indicated in ROS section   GEN: nad, alert and oriented HEENT: ncat NECK: supple w/o LA CV: rrr PULM: ctab, no inc wob ABD: soft, +bs EXT: no edema SKIN: no acute rash but chronic B nasal/facial rash.

## 2022-01-25 NOTE — Patient Instructions (Addendum)
Restart spironolactone and doxy.  Take care.  Glad to see you. Start with 1/2 tab of spironolactone for about 10 days.  Then 1 tab a day.  Recheck labs in about 1 month.  Nonfasting lab visit.

## 2022-01-26 DIAGNOSIS — N920 Excessive and frequent menstruation with regular cycle: Secondary | ICD-10-CM | POA: Insufficient documentation

## 2022-01-26 HISTORY — DX: Excessive and frequent menstruation with regular cycle: N92.0

## 2022-01-26 NOTE — Assessment & Plan Note (Signed)
Refer to gynecology.  Return for follow-up labs, see orders.

## 2022-01-26 NOTE — Assessment & Plan Note (Signed)
Restart spironolactone and doxy.  Start with 1/2 tab of spironolactone for about 10 days.  Then 1 tab a day.  Recheck labs in about 1 month.  Nonfasting lab visit.

## 2022-01-28 ENCOUNTER — Ambulatory Visit: Payer: Medicaid Other | Admitting: Internal Medicine

## 2022-01-28 ENCOUNTER — Encounter: Payer: Self-pay | Admitting: Internal Medicine

## 2022-01-28 VITALS — BP 112/78 | HR 98 | Temp 97.9°F | Ht 62.0 in | Wt 225.0 lb

## 2022-01-28 DIAGNOSIS — K2100 Gastro-esophageal reflux disease with esophagitis, without bleeding: Secondary | ICD-10-CM | POA: Diagnosis not present

## 2022-01-28 DIAGNOSIS — K219 Gastro-esophageal reflux disease without esophagitis: Secondary | ICD-10-CM | POA: Insufficient documentation

## 2022-01-28 MED ORDER — OMEPRAZOLE 20 MG PO CPDR: 20.0000 mg | DELAYED_RELEASE_CAPSULE | Freq: Every day | ORAL | 3 refills | Status: DC

## 2022-01-28 NOTE — Progress Notes (Signed)
Subjective:    Patient ID: Jessica Barron, female    DOB: 02/28/1980, 42 y.o.   MRN: [id number]  HPI Here due to acid reflux symptoms With husband  No general problems with heartburn--unless she eats before bedtime, etc Had some issues during last pregnancy (but no meds) Now more troubles in the past week No eating, etc that should have triggered the reflux Usually eats 4PM and goes to sleep 9PM Has awoken later with "hacking up mucus"--that was tinged with pepto bismol from prior spell Even noted some trouble breathing---heard some wheezing and this morning  Worried about aspiration--pain with deep breath Sent home from work by boss today No dysphagia  FH heart troubles--worried her  Current Outpatient Medications on File Prior to Visit  Medication Sig Dispense Refill   spironolactone (ALDACTONE) 50 MG tablet Take 0.5-1 tablets (25-50 mg total) by mouth daily. 90 tablet 1   doxycycline (VIBRA-TABS) 100 MG tablet Take 0.5 tablets (50 mg total) by mouth 2 (two) times daily. (Patient not taking: Reported on 01/28/2022) 90 tablet 1   famotidine (PEPCID) 20 MG tablet Take 1 tablet (20 mg total) by mouth 2 (two) times daily. (Patient not taking: Reported on 01/25/2022) 60 tablet 0   No current facility-administered medications on file prior to visit.    Allergies  Allergen Reactions   Lexapro [Escitalopram Oxalate] Other (See Comments)    Blunted affect   Wellbutrin [Bupropion] Other (See Comments)    intolerant    Past Medical History:  Diagnosis Date   Abnormal Pap smear    repeat WNL   COVID-19 02/07/2020   Depression    GERD (gastroesophageal reflux disease)    History of PCOS     Past Surgical History:  Procedure Laterality Date   CESAREAN SECTION  02/25/2011   Procedure: CESAREAN SECTION;  Surgeon: Allena Katz, MD;  Location: [facility];  Service: Gynecology;  Laterality: N/A;   CHOLECYSTECTOMY      Family History  Problem Relation Age of Onset   Bladder  Cancer Mother    COPD Mother    Cancer Mother        bladder   Bladder Cancer Father    Cancer Father        bladder   Breast cancer Paternal Grandmother    Anesthesia problems Neg Hx    Colon cancer Neg Hx     Social History   Socioeconomic History   Marital status: Significant Other    Spouse name: Not on file   Number of children: Not on file   Years of education: Not on file   Highest education level: Not on file  Occupational History   Not on file  Tobacco Use   Smoking status: Never    Passive exposure: Past   Smokeless tobacco: Never  Substance and Sexual Activity   Alcohol use: Yes    Comment: rare   Drug use: No   Sexual activity: Yes  Other Topics Concern   Not on file  Social History Narrative   Married 2010- separated from husband 2020.     Social Determinants of Health   Financial Resource Strain: Not on file  Food Insecurity: Not on file  Transportation Needs: Not on file  Physical Activity: Not on file  Stress: Not on file  Social Connections: Not on file  Intimate Partner Violence: Not on file   Review of Systems Rosacea---doxycycline for this (but hasn't started this--waiting to see if the regular spironolactone will  help)     Objective:   Physical Exam Constitutional:      Appearance: Normal appearance.  Cardiovascular:     Rate and Rhythm: Normal rate and regular rhythm.     Heart sounds: No murmur heard.    No gallop.  Pulmonary:     Effort: Pulmonary effort is normal.     Breath sounds: Normal breath sounds. No wheezing or rales.  Abdominal:     Palpations: Abdomen is soft.     Comments: Very slight epigastric tenderness  Musculoskeletal:     Cervical back: Neck supple.  Lymphadenopathy:     Cervical: No cervical adenopathy.  Neurological:     Mental Status: She is alert.            Assessment & Plan:

## 2022-01-28 NOTE — Assessment & Plan Note (Signed)
Intermittent symptoms with pregnancy and since--but much worse this time Unlikely that she aspirated---and no lung findings now Will start omeprazole 20 daily (bedtime) for 2-6 weeks----then can switch to prn

## 2022-02-21 IMAGING — DX DG HIP (WITH OR WITHOUT PELVIS) 2-3V*R*
2 series · 2 of 2 positions shown · non-contrast
Comparison: None.

CLINICAL DATA: Right hip pain.

EXAM:
DG HIP (WITH OR WITHOUT PELVIS) 2-3V RIGHT

[pelvis ap]
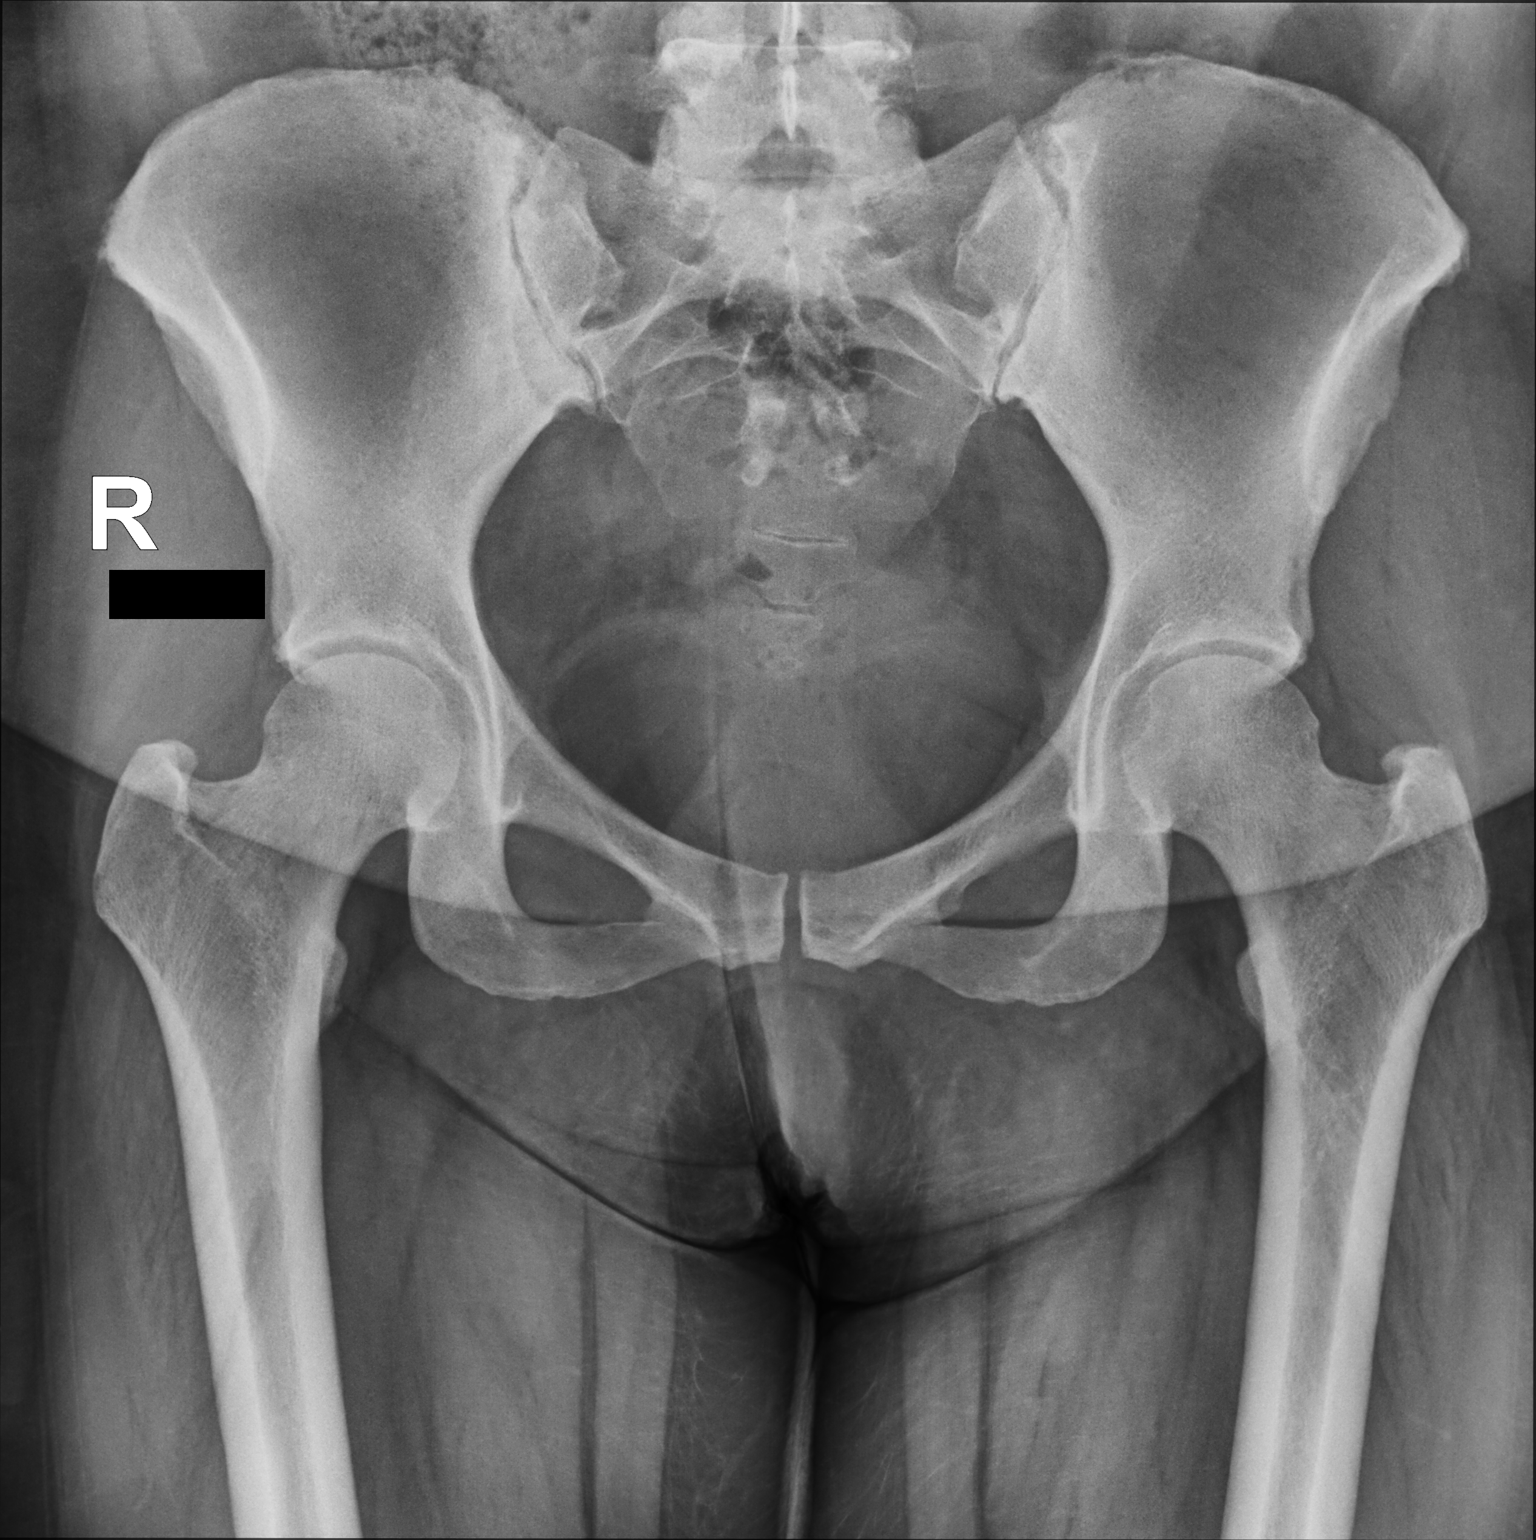

[hip frog leg lat]
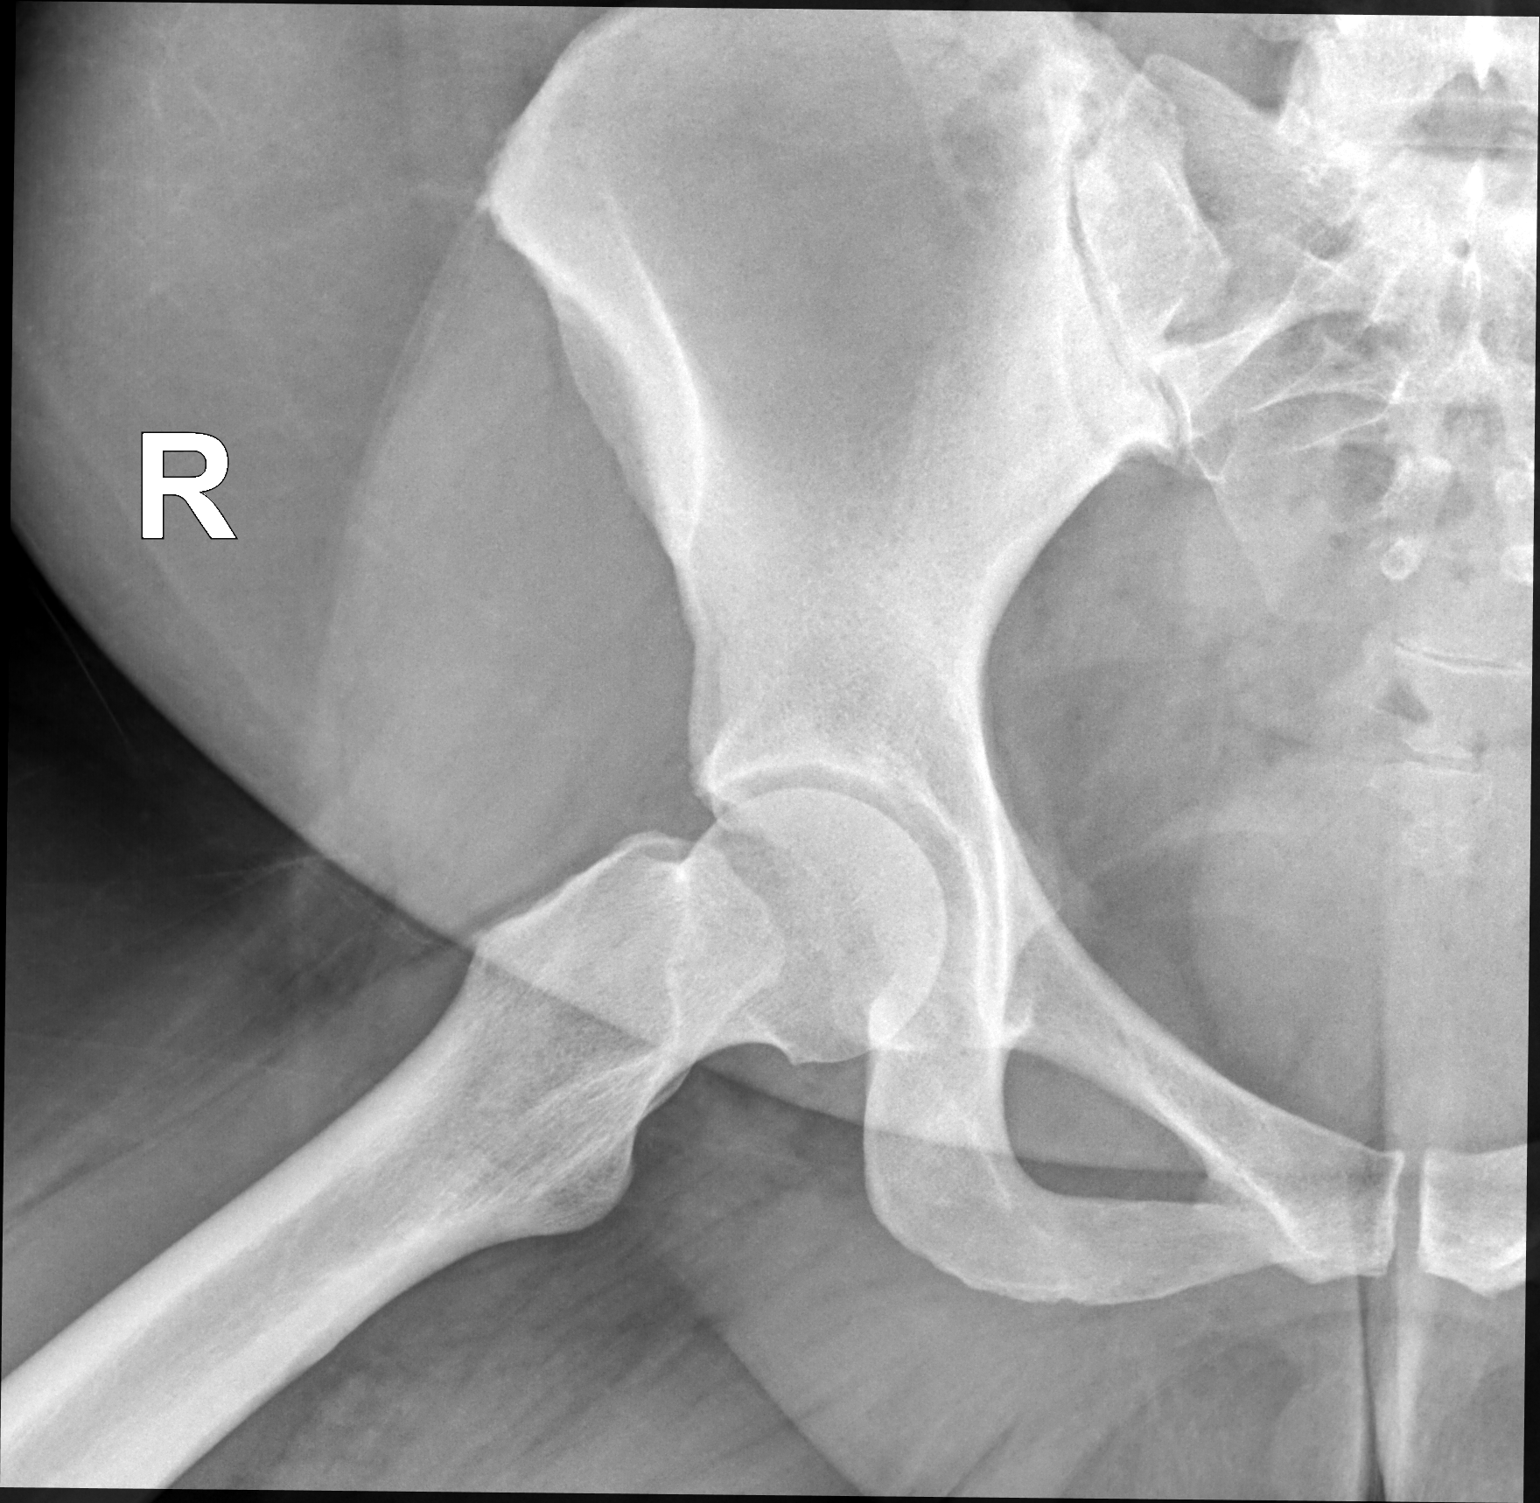

[2 of 2 positions shown; findings below may reference images not displayed]

FINDINGS: There is no evidence of hip fracture or dislocation. There is no
evidence of arthropathy or other focal bone abnormality.
IMPRESSION: Negative.

## 2022-02-25 ENCOUNTER — Other Ambulatory Visit: Payer: Medicaid Other

## 2022-04-14 ENCOUNTER — Other Ambulatory Visit: Payer: Self-pay | Admitting: Family Medicine

## 2022-04-14 ENCOUNTER — Encounter: Payer: Medicaid Other | Admitting: Family Medicine

## 2022-04-14 DIAGNOSIS — E785 Hyperlipidemia, unspecified: Secondary | ICD-10-CM

## 2022-04-14 DIAGNOSIS — Z8639 Personal history of other endocrine, nutritional and metabolic disease: Secondary | ICD-10-CM

## 2022-05-06 ENCOUNTER — Encounter: Payer: Self-pay | Admitting: Family Medicine

## 2022-05-06 ENCOUNTER — Telehealth: Payer: Self-pay | Admitting: Family Medicine

## 2022-05-06 DIAGNOSIS — Z3201 Encounter for pregnancy test, result positive: Secondary | ICD-10-CM

## 2022-05-06 NOTE — Telephone Encounter (Signed)
Patient got lab orders placed on 04/14/2022,that she has yet to come get drawn.She called in today asking could orders for a blood pregnancy test be placed as well.She got a positive home pregnancy test,where the lines are present but faint.She said the obgyn will not see her until 12 weeks if she is pregnant. She stated that she is very anxious and would like to know because of her age,and she said that she know that the medications that she was on but stopped taking because they were making her feel weird,can cause a person to have positive test,but not be pregnant.Please advise.

## 2022-05-09 NOTE — Telephone Encounter (Signed)
I put in the order.  I would get that done (HCG test). Thanks.

## 2022-05-09 NOTE — Telephone Encounter (Signed)
Patient was advised through [employer]. See other encounter for further details

## 2022-05-11 ENCOUNTER — Other Ambulatory Visit (INDEPENDENT_AMBULATORY_CARE_PROVIDER_SITE_OTHER): Payer: Medicaid Other

## 2022-05-11 DIAGNOSIS — Z3201 Encounter for pregnancy test, result positive: Secondary | ICD-10-CM

## 2022-05-11 DIAGNOSIS — Z8639 Personal history of other endocrine, nutritional and metabolic disease: Secondary | ICD-10-CM

## 2022-05-11 DIAGNOSIS — N920 Excessive and frequent menstruation with regular cycle: Secondary | ICD-10-CM

## 2022-05-11 DIAGNOSIS — E785 Hyperlipidemia, unspecified: Secondary | ICD-10-CM

## 2022-05-12 LAB — CBC WITH DIFFERENTIAL/PLATELET
Basophils Absolute: 0.1 10*3/uL (ref 0.0–0.1)
Basophils Relative: 0.7 % (ref 0.0–3.0)
Eosinophils Absolute: 0.1 10*3/uL (ref 0.0–0.7)
Eosinophils Relative: 0.8 % (ref 0.0–5.0)
HCT: 31.1 % — ABNORMAL LOW (ref 36.0–46.0)
Hemoglobin: 10.1 g/dL — ABNORMAL LOW (ref 12.0–15.0)
Lymphocytes Relative: 15.5 % (ref 12.0–46.0)
Lymphs Abs: 1.6 10*3/uL (ref 0.7–4.0)
MCHC: 32.4 g/dL (ref 30.0–36.0)
MCV: 74.2 fl — ABNORMAL LOW (ref 78.0–100.0)
Monocytes Absolute: 0.5 10*3/uL (ref 0.1–1.0)
Monocytes Relative: 4.9 % (ref 3.0–12.0)
Neutro Abs: 8.2 10*3/uL — ABNORMAL HIGH (ref 1.4–7.7)
Neutrophils Relative %: 78.1 % — ABNORMAL HIGH (ref 43.0–77.0)
Platelets: 416 10*3/uL — ABNORMAL HIGH (ref 150.0–400.0)
RBC: 4.19 Mil/uL (ref 3.87–5.11)
RDW: 16 % — ABNORMAL HIGH (ref 11.5–15.5)
WBC: 10.6 10*3/uL — ABNORMAL HIGH (ref 4.0–10.5)

## 2022-05-12 LAB — BASIC METABOLIC PANEL
BUN: 11 mg/dL (ref 6–23)
CO2: 24 mEq/L (ref 19–32)
Calcium: 9.2 mg/dL (ref 8.4–10.5)
Chloride: 102 mEq/L (ref 96–112)
Creatinine, Ser: 0.71 mg/dL (ref 0.40–1.20)
GFR: 104.73 mL/min (ref >60.00)
Glucose, Bld: 78 mg/dL (ref 70–99)
Potassium: 3.9 mEq/L (ref 3.5–5.1)
Sodium: 135 mEq/L (ref 135–145)

## 2022-05-12 LAB — LIPID PANEL
Cholesterol: 207 mg/dL — ABNORMAL HIGH (ref 0–200)
HDL: 54.8 mg/dL (ref >39.00)
LDL Cholesterol: 130 mg/dL — ABNORMAL HIGH (ref 0–99)
NonHDL: 151.7
Total CHOL/HDL Ratio: 4
Triglycerides: 110 mg/dL (ref 0.0–149.0)
VLDL: 22 mg/dL (ref 0.0–40.0)

## 2022-05-12 LAB — HCG, QUANTITATIVE, PREGNANCY: Quantitative HCG: 68320 m[IU]/mL

## 2022-05-12 LAB — TSH: TSH: 1.37 u[IU]/mL (ref 0.35–5.50)

## 2022-05-12 LAB — FERRITIN: Ferritin: 4.6 ng/mL — ABNORMAL LOW (ref 10.0–291.0)

## 2022-05-12 LAB — VITAMIN D 25 HYDROXY (VIT D DEFICIENCY, FRACTURES): VITD: 28.07 ng/mL — ABNORMAL LOW (ref 30.00–100.00)

## 2022-05-12 LAB — IRON: Iron: 37 ug/dL — ABNORMAL LOW (ref 42–145)

## 2022-05-13 NOTE — Telephone Encounter (Signed)
Spoke with patient and she is really worried about all the numbers with her CBC. She states they are high and low and she is worried something is wrong especially with being pregnant. She does not need referral for OBGYN; appt is 06/21/22.

## 2022-05-13 NOTE — Telephone Encounter (Signed)
Pt called back returning Jessica's missed call. Pt stated she saw Duncan's response regarding lab results. Pt stated she wanted to discuss results due to her being concerned. Pt stated she is already speaking with a nurse from a OBGYN office so she doesn't need any referrals. Pt stated she is at work until 3:30 & requested for the call back from [place] to be after that time. Call back # [phone]

## 2022-05-15 ENCOUNTER — Other Ambulatory Visit: Payer: Self-pay | Admitting: Family Medicine

## 2022-05-15 MED ORDER — VITAMIN D3 50 MCG (2000 UT) PO CAPS: 2000.0000 [IU] | ORAL_CAPSULE | Freq: Every day | ORAL | Status: DC

## 2022-05-15 MED ORDER — PRENATAL VITAMIN PLUS LOW IRON 27-1 MG PO TABS: 1.0000 | ORAL_TABLET | Freq: Every day | ORAL | 2 refills | Status: DC

## 2022-05-18 ENCOUNTER — Other Ambulatory Visit (INDEPENDENT_AMBULATORY_CARE_PROVIDER_SITE_OTHER): Payer: Medicaid Other

## 2022-05-18 ENCOUNTER — Ambulatory Visit (INDEPENDENT_AMBULATORY_CARE_PROVIDER_SITE_OTHER): Payer: Medicaid Other | Admitting: *Deleted

## 2022-05-18 VITALS — BP 129/86 | HR 93 | Wt 234.0 lb

## 2022-05-18 DIAGNOSIS — Z3481 Encounter for supervision of other normal pregnancy, first trimester: Secondary | ICD-10-CM | POA: Diagnosis not present

## 2022-05-18 DIAGNOSIS — O3680X0 Pregnancy with inconclusive fetal viability, not applicable or unspecified: Secondary | ICD-10-CM

## 2022-05-18 DIAGNOSIS — O0990 Supervision of high risk pregnancy, unspecified, unspecified trimester: Secondary | ICD-10-CM

## 2022-05-18 DIAGNOSIS — O99210 Obesity complicating pregnancy, unspecified trimester: Secondary | ICD-10-CM | POA: Insufficient documentation

## 2022-05-18 DIAGNOSIS — O34219 Maternal care for unspecified type scar from previous cesarean delivery: Secondary | ICD-10-CM | POA: Insufficient documentation

## 2022-05-18 DIAGNOSIS — Z3A01 Less than 8 weeks gestation of pregnancy: Secondary | ICD-10-CM

## 2022-05-18 HISTORY — DX: Supervision of high risk pregnancy, unspecified, unspecified trimester: O09.90

## 2022-05-18 NOTE — Progress Notes (Signed)
New OB Intake  I explained I am completing New OB Intake today. We discussed her EDD of 12/30/2022 that is based on LMP. LMP of 03/25/22. Pt is G2/P1. I reviewed her allergies, medications, Medical/Surgical/OB history, and appropriate screenings.   Patient Active Problem List   Diagnosis Date Noted   Supervision of high risk pregnancy, antepartum 05/18/2022   Previous cesarean delivery, antepartum 05/18/2022   Obesity affecting pregnancy 05/18/2022   GERD (gastroesophageal reflux disease) 01/28/2022   Raynaud's phenomenon 01/03/2021   Hip pain 01/03/2021   Rosacea 01/01/2021   FH: bladder cancer 02/18/2018   Elevated BP without diagnosis of hypertension 02/11/2016   Hyperlipidemia 02/11/2016   PCOS (polycystic ovarian syndrome) 09/11/2013    Concerns addressed today  Delivery Plans:  Plans to deliver at [facility].   MyChart/Babyscripts MyChart access verified.   Blood Pressure Cuff  BP cuff, pt has one. Discussed to be used for virtual visits and or if needed BP checks weekly.    Anatomy US Explained first scheduled [place] will be around 19 weeks.   Labs Discussed Avelina Laine genetic screening with patient. Would like both Panorama and Horizon drawn at new OB visit. Routine prenatal labs needed.   Placed OB Box on problem list and updated   Patient informed that the ultrasound is considered a limited obstetric ultrasound and is not intended to be a complete ultrasound exam.  Patient also informed that the ultrasound is not being completed with the intent of assessing for fetal or placental anomalies or any pelvic abnormalities. Explained that the purpose of today's ultrasound is to assess for dating and fetal heart rate.  Patient acknowledges the purpose of the exam and the limitations of the study.      First visit review I reviewed new OB appt with pt. I explained she will have a pelvic exam, ob bloodwork with genetic screening, and PAP smear. Explained pt will be seen by Dr Macon Large  at first visit.    Scheryl Marten, RN 05/18/2022  4:31 PM

## 2022-06-01 ENCOUNTER — Ambulatory Visit (INDEPENDENT_AMBULATORY_CARE_PROVIDER_SITE_OTHER): Payer: Medicaid Other | Admitting: *Deleted

## 2022-06-01 DIAGNOSIS — O34219 Maternal care for unspecified type scar from previous cesarean delivery: Secondary | ICD-10-CM

## 2022-06-01 DIAGNOSIS — O0991 Supervision of high risk pregnancy, unspecified, first trimester: Secondary | ICD-10-CM

## 2022-06-01 DIAGNOSIS — Z3A09 9 weeks gestation of pregnancy: Secondary | ICD-10-CM

## 2022-06-01 DIAGNOSIS — O0990 Supervision of high risk pregnancy, unspecified, unspecified trimester: Secondary | ICD-10-CM

## 2022-06-01 NOTE — Progress Notes (Signed)
Pt here at 9weeks 5days, for a FHR check via [place] for reassurance. Denies any cramping ir bleeding, just have different symptoms than her previous pregnancy.   FHR 172.   Follow up at [facility] or as needed.   Scheryl Marten, RN

## 2022-06-07 ENCOUNTER — Encounter: Payer: Self-pay | Admitting: Family Medicine

## 2022-06-07 ENCOUNTER — Ambulatory Visit: Payer: Medicaid Other | Admitting: Family Medicine

## 2022-06-07 VITALS — BP 122/84 | HR 98 | Temp 96.9°F | Ht 62.0 in | Wt 235.0 lb

## 2022-06-07 DIAGNOSIS — D649 Anemia, unspecified: Secondary | ICD-10-CM

## 2022-06-07 NOTE — Patient Instructions (Addendum)
We'll update you about your labs.  Take care.  Glad to see you.

## 2022-06-07 NOTE — Progress Notes (Signed)
She couldn't tolerate extra iron.  She is on PNV.  She had diarrhea on iron, along with nausea.  She has some morning sickness, similar to prev pregnancy.  She is 10+ weeks.  No VB, CTX.  She had fetal movement seen on outside clinic eval.     Sugar recently 78, ie not elevated.      What to do about iron level? Can she get lower dose of iron that she can tolerate? Options?    Soft SEM.

## 2022-06-08 DIAGNOSIS — D509 Iron deficiency anemia, unspecified: Secondary | ICD-10-CM | POA: Insufficient documentation

## 2022-06-08 DIAGNOSIS — D649 Anemia, unspecified: Secondary | ICD-10-CM | POA: Insufficient documentation

## 2022-06-08 DIAGNOSIS — O99019 Anemia complicating pregnancy, unspecified trimester: Secondary | ICD-10-CM | POA: Insufficient documentation

## 2022-06-08 LAB — CBC WITH DIFFERENTIAL/PLATELET
Basophils Absolute: 0 10*3/uL (ref 0.0–0.1)
Basophils Relative: 0.3 % (ref 0.0–3.0)
Eosinophils Absolute: 0 10*3/uL (ref 0.0–0.7)
Eosinophils Relative: 0.4 % (ref 0.0–5.0)
HCT: 30.6 % — ABNORMAL LOW (ref 36.0–46.0)
Hemoglobin: 9.9 g/dL — ABNORMAL LOW (ref 12.0–15.0)
Lymphocytes Relative: 11.6 % — ABNORMAL LOW (ref 12.0–46.0)
Lymphs Abs: 1.2 10*3/uL (ref 0.7–4.0)
MCHC: 32.3 g/dL (ref 30.0–36.0)
MCV: 77.4 fl — ABNORMAL LOW (ref 78.0–100.0)
Monocytes Absolute: 0.6 10*3/uL (ref 0.1–1.0)
Monocytes Relative: 5.5 % (ref 3.0–12.0)
Neutro Abs: 8.4 10*3/uL — ABNORMAL HIGH (ref 1.4–7.7)
Neutrophils Relative %: 82.2 % — ABNORMAL HIGH (ref 43.0–77.0)
Platelets: 300 10*3/uL (ref 150.0–400.0)
RBC: 3.95 Mil/uL (ref 3.87–5.11)
RDW: 18.9 % — ABNORMAL HIGH (ref 11.5–15.5)
WBC: 10.3 10*3/uL (ref 4.0–10.5)

## 2022-06-08 LAB — IRON: Iron: 273 ug/dL — ABNORMAL HIGH (ref 42–145)

## 2022-06-08 LAB — FOLATE: Folate: >23.9 ng/mL (ref >5.9)

## 2022-06-08 NOTE — Assessment & Plan Note (Signed)
Taking prenatal vitamin, pregnant, has OB follow-up pending.  Soft murmur noted as expected.  Discussed.  Will recheck labs today.  I would not suspect an ominous issue with her mildly elevated platelet level.  We will see about options for iron replacement, if needed.  See notes on labs.

## 2022-06-21 ENCOUNTER — Encounter: Payer: Self-pay | Admitting: Obstetrics & Gynecology

## 2022-06-21 ENCOUNTER — Other Ambulatory Visit (HOSPITAL_COMMUNITY)
Admission: RE | Admit: 2022-06-21 | Discharge: 2022-06-21 | Disposition: A | Discharge status: Home / Self Care | Payer: Medicaid Other | Source: Ambulatory Visit | Attending: Obstetrics & Gynecology | Admitting: Obstetrics & Gynecology

## 2022-06-21 ENCOUNTER — Ambulatory Visit (INDEPENDENT_AMBULATORY_CARE_PROVIDER_SITE_OTHER): Payer: Medicaid Other | Admitting: Obstetrics & Gynecology

## 2022-06-21 ENCOUNTER — Telehealth: Payer: Self-pay

## 2022-06-21 VITALS — BP 127/83 | HR 91 | Wt 235.0 lb

## 2022-06-21 DIAGNOSIS — D508 Other iron deficiency anemias: Secondary | ICD-10-CM | POA: Diagnosis not present

## 2022-06-21 DIAGNOSIS — O34219 Maternal care for unspecified type scar from previous cesarean delivery: Secondary | ICD-10-CM

## 2022-06-21 DIAGNOSIS — N898 Other specified noninflammatory disorders of vagina: Secondary | ICD-10-CM | POA: Insufficient documentation

## 2022-06-21 DIAGNOSIS — L0231 Cutaneous abscess of buttock: Secondary | ICD-10-CM | POA: Diagnosis not present

## 2022-06-21 DIAGNOSIS — Z3A12 12 weeks gestation of pregnancy: Secondary | ICD-10-CM

## 2022-06-21 DIAGNOSIS — O99210 Obesity complicating pregnancy, unspecified trimester: Secondary | ICD-10-CM | POA: Diagnosis not present

## 2022-06-21 DIAGNOSIS — O09521 Supervision of elderly multigravida, first trimester: Secondary | ICD-10-CM

## 2022-06-21 DIAGNOSIS — O26891 Other specified pregnancy related conditions, first trimester: Secondary | ICD-10-CM

## 2022-06-21 DIAGNOSIS — O09529 Supervision of elderly multigravida, unspecified trimester: Secondary | ICD-10-CM | POA: Insufficient documentation

## 2022-06-21 DIAGNOSIS — O0990 Supervision of high risk pregnancy, unspecified, unspecified trimester: Secondary | ICD-10-CM | POA: Insufficient documentation

## 2022-06-21 DIAGNOSIS — O09299 Supervision of pregnancy with other poor reproductive or obstetric history, unspecified trimester: Secondary | ICD-10-CM | POA: Insufficient documentation

## 2022-06-21 MED ORDER — ASPIRIN 81 MG PO TBEC
81.0000 mg | DELAYED_RELEASE_TABLET | Freq: Every day | ORAL | 2 refills | Status: DC
Start: 2022-06-21 — End: 2022-11-26

## 2022-06-21 MED ORDER — CLINDAMYCIN HCL 300 MG PO CAPS
600.0000 mg | ORAL_CAPSULE | Freq: Three times a day (TID) | ORAL | 0 refills | Status: AC
Start: 2022-06-21 — End: 2022-06-28

## 2022-06-21 NOTE — Telephone Encounter (Signed)
error 

## 2022-06-21 NOTE — Progress Notes (Signed)
History:   Jessica Barron is a 42 y.o. G2P1001 at [redacted]w[redacted]d by LMP, early ultrasound being seen today for her first obstetrical visit.  Her obstetrical history is significant for  one term pregnancy in 2013, complicated by needing a cesarean delivery for breech presentation.  Infant was 4325 g at birth also, stayed in NICU for a few days due to hypoglycemia . Also had postpartum severe preeclampsia, required magnesium sulfate after delivery.  No diagnosis of HTN after pregnancy. Patient does not intend to breast feed. Pregnancy history fully reviewed.  Patient reports  vaginal discharge for a few days, wants evaluation . Here with FOB.       HISTORY: OB History  Gravida Para Term Preterm AB Living  2 1 1  0 0 1  SAB IAB Ectopic Multiple Live Births  0 0 0 0 1    # Outcome Date GA Lbr Len/2nd Weight Sex Delivery Anes PTL Lv  2 Current           1 Term 02/25/11 [redacted]w[redacted]d  9 lb 8.6 oz (4.325 kg) F CS-LTranv Spinal  LIV     Birth Comments: NICU stay for persistent hypoglycemia Passed hearing screen     Complications: Breech presentation of fetus     Name: Salmela,GIRL Marquel     Apgar1: 9  Apgar5: 9    Last pap smear was done 2021 and was normal  Past Medical History:  Diagnosis Date   Abnormal Pap smear    repeat WNL   Constipation 02/18/2018   Depression    FH: bladder cancer 02/18/2018   GERD (gastroesophageal reflux disease)    Hip pain 01/03/2021   History of PCOS    Hyperlipidemia 02/11/2016   Menorrhagia with regular cycle 01/26/2022   PCOS (polycystic ovarian syndrome) 09/11/2013   Raynaud's phenomenon 01/03/2021   Rosacea 01/01/2021   Past Surgical History:  Procedure Laterality Date   CESAREAN SECTION  02/25/2011   Procedure: CESAREAN SECTION;  Surgeon: Leslie Andrea, MD;  Location: [facility];  Service: Gynecology;  Laterality: N/A;   CHOLECYSTECTOMY     Family History  Problem Relation Age of Onset   Bladder Cancer Mother    COPD Mother    Cancer Mother         bladder   Bladder Cancer Father    Cancer Father        bladder   Breast cancer Paternal Grandmother    Anesthesia problems Neg Hx    Colon cancer Neg Hx    Social History   Tobacco Use   Smoking status: Never    Passive exposure: Past   Smokeless tobacco: Never  Substance Use Topics   Alcohol use: Yes    Comment: rare   Drug use: No   Allergies  Allergen Reactions   Lexapro [Escitalopram Oxalate] Other (See Comments)    Blunted affect   Wellbutrin [Bupropion] Other (See Comments)    intolerant   Current Outpatient Medications on File Prior to Visit  Medication Sig Dispense Refill   Cholecalciferol (VITAMIN D3) 50 MCG (2000 UT) capsule Take 1 capsule (2,000 Units total) by mouth daily.     Prenatal Vit-Fe Fumarate-FA (PRENATAL VITAMIN PLUS LOW IRON) 27-1 MG TABS Take 1 tablet by mouth daily. 100 tablet 2   No current facility-administered medications on file prior to visit.   Review of Systems Pertinent items noted in HPI and remainder of comprehensive ROS otherwise negative.  Physical Exam:   Vitals:   06/21/22  1020  BP: 127/83  Pulse: 91  Weight: 235 lb (106.6 kg)   Fetal Heart Rate (bpm): 150   General: well-developed, well-nourished female in no acute distress  Breasts:  normal appearance, no masses or tenderness bilaterally, exam done in the presence of a chaperone.   Skin: normal coloration and turgor, no rashes  Neurologic: oriented, normal, negative, normal mood  Extremities: normal strength, tone, and muscle mass, ROM of all joints is normal  HEENT PERRLA, extraocular movement intact and sclera clear, anicteric  Neck supple and no masses  Cardiovascular: regular rate and rhythm  Respiratory:  no respiratory distress, normal breath sounds  Abdomen: soft, non-tender; bowel sounds normal; no masses,  no organomegaly  Pelvic: normal external genitalia, no lesions, normal vaginal mucosa, yellow vaginal discharge seen and testing sample obtained, normal  cervix, pap smear done. Draining skin abscess noted in inferior part of left buttock, with mild erythema. Has purulent drainage. Exam done in the presence of a chaperone.     Assessment:    Pregnancy: G2P1001 Patient Active Problem List   Diagnosis Date Noted   Advanced maternal age in multigravida 06/21/2022   History of postpartum severe preeclampsia 06/21/2022   Anemia 06/08/2022   Supervision of high risk pregnancy, antepartum 05/18/2022   Previous cesarean delivery, antepartum 05/18/2022   Maternal morbid obesity, antepartum (HCC) 05/18/2022   GERD (gastroesophageal reflux disease) 01/28/2022   Hip pain 01/03/2021     Plan:    1. Multigravida of advanced maternal age in first trimester NIPS done, also detailed anatomy scan.  - PANORAMA PRENATAL TEST FULL PANEL - [place] MFM OB DETAIL +14 WK; Future - aspirin EC 81 MG tablet; Take 1 tablet (81 mg total) by mouth at bedtime. Start taking when you are [redacted] weeks pregnant for rest of pregnancy for prevention of preeclampsia  Dispense: 300 tablet; Refill: 2 - AMB Referral to Cardio Obstetrics  2. Previous cesarean delivery, antepartum Desires repeat cesarean section for now.  3. Other iron deficiency anemia History of anemia, ferritin checked today. - Ferritin  4. Maternal morbid obesity, antepartum (HCC) Surveillance labs today, recommended TWG 11-20 lbs. ASA prescribed.  - [place] MFM OB DETAIL +14 WK; Future - Comprehensive metabolic panel - Hemoglobin A1c - Protein / creatinine ratio, urine - TSH Rfx on Abnormal to Free T4 - aspirin EC 81 MG tablet; Take 1 tablet (81 mg total) by mouth at bedtime. Start taking when you are [redacted] weeks pregnant for rest of pregnancy for prevention of preeclampsia  Dispense: 300 tablet; Refill: 2 - AMB Referral to Cardio Obstetrics  5. History of postpartum severe preeclampsia Labs today. ASA prescribed. Recommended 81-162 mg po qhs.  - Comprehensive metabolic panel - Protein / creatinine ratio,  urine - aspirin EC 81 MG tablet; Take 1 tablet (81 mg total) by mouth at bedtime. Start taking when you are [redacted] weeks pregnant for rest of pregnancy for prevention of preeclampsia  Dispense: 300 tablet; Refill: 2  6. Vaginal discharge during pregnancy in first trimester - Cervicovaginal ancillary only( [place]) done, will follow up results and manage accordingly.  7. Cutaneous abscess of buttock Draining on its own.  Recommended Clindamycin course.  - clindamycin (CLEOCIN) 300 MG capsule; Take 2 capsules (600 mg total) by mouth 3 (three) times daily for 7 days.  Dispense: 42 capsule; Refill: 0  8. [redacted] weeks gestation of pregnancy 9. Supervision of high risk pregnancy, antepartum - Cytology - PAP - PANORAMA PRENATAL TEST FULL PANEL - CBC/D/Plt+RPR+Rh+ABO+RubIgG... -  Culture, OB Urine - HORIZON Basic Panel - [place] MFM OB DETAIL +14 WK; Future - SPECIAL REPORTS OR FORMS [id number] - Babyscripts Schedule Optimization  Initial labs drawn. Continue prenatal vitamins. Problem list reviewed and updated. Genetic Screening discussed, Panorama and Horizon: ordered. Ultrasound discussed; fetal anatomic survey: scheduled. Anticipatory guidance about prenatal visits given including labs, ultrasounds, and testing. Discussed usage of the Babyscripts app for more information about pregnancy, and to track blood pressures. Also discussed usage of virtual visits as additional source of managing and completing prenatal visits.  Patient was encouraged to use MyChart to review results, send requests, and have questions addressed.   The nature of Center for [facility]Faculty Practice with multiple MDs and Advanced Practice Providers was explained to patient; also emphasized that residents, students are part of our team. Routine obstetric precautions reviewed. Encouraged to seek out care at our office or emergency room [facility] preferred) for urgent and/or emergent concerns. Return in about 4 weeks  (around 07/19/2022) for OFFICE OB VISIT (MD only).     Jaynie Collins, MD, FACOG Obstetrician & Gynecologist, [facility] for [employer], [facility]

## 2022-06-22 LAB — CERVICOVAGINAL ANCILLARY ONLY
Bacterial Vaginitis (gardnerella): NEGATIVE
Candida Glabrata: NEGATIVE
Candida Vaginitis: NEGATIVE
Chlamydia: NEGATIVE
Comment: NEGATIVE
Comment: NEGATIVE
Comment: NEGATIVE
Comment: NEGATIVE
Comment: NEGATIVE
Comment: NORMAL
Neisseria Gonorrhea: NEGATIVE
Trichomonas: NEGATIVE

## 2022-06-22 LAB — CBC/D/PLT+RPR+RH+ABO+RUBIGG...
Antibody Screen: NEGATIVE
Basophils Absolute: 0 10*3/uL (ref 0.0–0.2)
Basos: 0 %
EOS (ABSOLUTE): 0 10*3/uL (ref 0.0–0.4)
Eos: 0 %
HCV Ab: NONREACTIVE
HIV Screen 4th Generation wRfx: NONREACTIVE
Hematocrit: 35.3 % (ref 34.0–46.6)
Hemoglobin: 11.3 g/dL (ref 11.1–15.9)
Hepatitis B Surface Ag: NEGATIVE
Immature Grans (Abs): 0 10*3/uL (ref 0.0–0.1)
Immature Granulocytes: 0 %
Lymphocytes Absolute: 1.4 10*3/uL (ref 0.7–3.1)
Lymphs: 12 %
MCH: 25.5 pg — ABNORMAL LOW (ref 26.6–33.0)
MCHC: 32 g/dL (ref 31.5–35.7)
MCV: 80 fL (ref 79–97)
Monocytes Absolute: 0.4 10*3/uL (ref 0.1–0.9)
Monocytes: 4 %
Neutrophils Absolute: 9.4 10*3/uL — ABNORMAL HIGH (ref 1.4–7.0)
Neutrophils: 84 %
Platelets: 400 10*3/uL (ref 150–450)
RBC: 4.44 x10E6/uL (ref 3.77–5.28)
RDW: 19 % — ABNORMAL HIGH (ref 11.7–15.4)
RPR Ser Ql: NONREACTIVE
Rh Factor: POSITIVE
Rubella Antibodies, IGG: 2.49 index (ref >0.99)
WBC: 11.4 10*3/uL — ABNORMAL HIGH (ref 3.4–10.8)

## 2022-06-22 LAB — COMPREHENSIVE METABOLIC PANEL
ALT: 25 IU/L (ref 0–32)
AST: 17 IU/L (ref 0–40)
Albumin/Globulin Ratio: 1.5 (ref 1.2–2.2)
Albumin: 4.1 g/dL (ref 3.9–4.9)
Alkaline Phosphatase: 67 IU/L (ref 44–121)
BUN/Creatinine Ratio: 15 (ref 9–23)
BUN: 9 mg/dL (ref 6–24)
Bilirubin Total: 0.3 mg/dL (ref 0.0–1.2)
CO2: 16 mmol/L — ABNORMAL LOW (ref 20–29)
Calcium: 9.1 mg/dL (ref 8.7–10.2)
Chloride: 101 mmol/L (ref 96–106)
Creatinine, Ser: 0.61 mg/dL (ref 0.57–1.00)
Globulin, Total: 2.7 g/dL (ref 1.5–4.5)
Glucose: 81 mg/dL (ref 70–99)
Potassium: 4.3 mmol/L (ref 3.5–5.2)
Sodium: 138 mmol/L (ref 134–144)
Total Protein: 6.8 g/dL (ref 6.0–8.5)
eGFR: 114 mL/min/{1.73_m2} (ref >59)

## 2022-06-22 LAB — PROTEIN / CREATININE RATIO, URINE
Creatinine, Urine: 169.7 mg/dL
Protein, Ur: 14.2 mg/dL
Protein/Creat Ratio: 84 mg/g creat (ref 0–200)

## 2022-06-22 LAB — HEMOGLOBIN A1C
Est. average glucose Bld gHb Est-mCnc: 105 mg/dL
Hgb A1c MFr Bld: 5.3 % (ref 4.8–5.6)

## 2022-06-22 LAB — HCV INTERPRETATION

## 2022-06-22 LAB — TSH RFX ON ABNORMAL TO FREE T4: TSH: 1.2 u[IU]/mL (ref 0.450–4.500)

## 2022-06-22 LAB — FERRITIN: Ferritin: 12 ng/mL — ABNORMAL LOW (ref 15–150)

## 2022-06-23 LAB — CYTOLOGY - PAP
Comment: NEGATIVE
Diagnosis: NEGATIVE
High risk HPV: NEGATIVE

## 2022-06-23 LAB — URINE CULTURE, OB REFLEX

## 2022-06-23 LAB — CULTURE, OB URINE

## 2022-06-24 ENCOUNTER — Encounter: Payer: Self-pay | Admitting: Obstetrics & Gynecology

## 2022-06-24 MED ORDER — FERRIC MALTOL 30 MG PO CAPS
1.0000 | ORAL_CAPSULE | Freq: Two times a day (BID) | ORAL | 2 refills | Status: DC
Start: 2022-06-24 — End: 2022-07-06

## 2022-06-24 NOTE — Addendum Note (Signed)
Addended by: Jaynie Collins A on: 06/24/2022 06:31 AM   Modules accepted: Orders

## 2022-06-28 ENCOUNTER — Ambulatory Visit: Payer: Medicaid Other

## 2022-06-28 ENCOUNTER — Encounter: Payer: Self-pay | Admitting: Cardiology

## 2022-06-28 ENCOUNTER — Ambulatory Visit: Payer: Medicaid Other | Attending: Cardiology | Admitting: Cardiology

## 2022-06-28 VITALS — BP 122/80 | HR 89 | Ht 62.0 in | Wt 236.4 lb

## 2022-06-28 DIAGNOSIS — I7300 Raynaud's syndrome without gangrene: Secondary | ICD-10-CM | POA: Diagnosis not present

## 2022-06-28 DIAGNOSIS — O09522 Supervision of elderly multigravida, second trimester: Secondary | ICD-10-CM

## 2022-06-28 DIAGNOSIS — R002 Palpitations: Secondary | ICD-10-CM | POA: Diagnosis not present

## 2022-06-28 DIAGNOSIS — Z3A13 13 weeks gestation of pregnancy: Secondary | ICD-10-CM | POA: Diagnosis not present

## 2022-06-28 DIAGNOSIS — E782 Mixed hyperlipidemia: Secondary | ICD-10-CM | POA: Diagnosis not present

## 2022-06-28 DIAGNOSIS — O99210 Obesity complicating pregnancy, unspecified trimester: Secondary | ICD-10-CM | POA: Diagnosis not present

## 2022-06-28 NOTE — Patient Instructions (Signed)
Medication Instructions:  Your physician recommends that you continue on your current medications as directed. Please refer to the Current Medication list given to you today.  *If you need a refill on your cardiac medications before your next appointment, please call your pharmacy*   Testing/Procedures: Your physician has requested that you have an echocardiogram. Echocardiography is a painless test that uses sound waves to create images of your heart. It provides your doctor with information about the size and shape of your heart and how well your heart's chambers and valves are working. This procedure takes approximately one hour. There are no restrictions for this procedure. Please do NOT wear cologne, perfume, aftershave, or lotions (deodorant is allowed). Please arrive 15 minutes prior to your appointment time.   ZIO XT- Long Term Monitor Instructions   Your physician has requested you wear your ZIO patch monitor___7____days.   This is a single patch monitor.  Irhythm supplies one patch monitor per enrollment.  Additional stickers are not available.   Please do not apply patch if you will be having a Nuclear Stress Test, Echocardiogram, Cardiac CT, MRI, or Chest Xray during the time frame you would be wearing the monitor. The patch cannot be worn during these tests.  You cannot remove and re-apply the ZIO XT patch monitor.   Your ZIO patch monitor will be sent USPS Priority mail from [facility] directly to your home address. The monitor may also be mailed to a PO BOX if home delivery is not available.   It may take 3-5 days to receive your monitor after you have been enrolled.   Once you have received you monitor, please review enclosed instructions.  Your monitor has already been registered assigning a specific monitor serial # to you.   Applying the monitor   Shave hair from upper left chest.   Hold abrader disc by orange tab.  Rub abrader in 40 strokes over left upper  chest as indicated in your monitor instructions.   Clean area with 4 enclosed alcohol pads .  Use all pads to assure are is cleaned thoroughly.  Let dry.   Apply patch as indicated in monitor instructions.  Patch will be place under collarbone on left side of chest with arrow pointing upward.   Rub patch adhesive wings for 2 minutes.Remove white label marked "1".  Remove white label marked "2".  Rub patch adhesive wings for 2 additional minutes.   While looking in a mirror, press and release button in center of patch.  A small green light will flash 3-4 times .  This will be your only indicator the monitor has been turned on.     Do not shower for the first 24 hours.  You may shower after the first 24 hours.   Press button if you feel a symptom. You will hear a small click.  Record Date, Time and Symptom in the Patient Log Book.   When you are ready to remove patch, follow instructions on last 2 pages of Patient Log Book.  Stick patch monitor onto last page of Patient Log Book.   Place Patient Log Book in [place] box.  Use locking tab on box and tape box closed securely.  The Orange and [employer] has [employer] on it.  Please place in mailbox as soon as possible.  Your physician should have your test results approximately 7 days after the monitor has been mailed back to [facility].   Call [facility] Customer Care at [phone]  if you have questions regarding your ZIO XT patch monitor.  Call them immediately if you see an orange light blinking on your monitor.   If your monitor falls off in less than 4 days contact our Monitor department at [phone].  If your monitor becomes loose or falls off after 4 days call [employer] at [phone] for suggestions on securing your monitor.     Follow-Up: At [facility], you and your health needs are our priority.  As part of our continuing mission to provide you with exceptional heart care, we have created designated Provider  Care Teams.  These Care Teams include your primary Cardiologist (physician) and Advanced Practice Providers (APPs -  Physician Assistants and Nurse Practitioners) who all work together to provide you with the care you need, when you need it.  We recommend signing up for the patient portal called "MyChart".  Sign up information is provided on this After Visit Summary.  MyChart is used to connect with patients for Virtual Visits (Telemedicine).  Patients are able to view lab/test results, encounter notes, upcoming appointments, etc.  Non-urgent messages can be sent to your provider as well.   To learn more about what you can do with MyChart, go to [email or web address].    Your next appointment:   4 month(s)  Provider:   DR. Thomasene Ripple

## 2022-06-28 NOTE — Progress Notes (Signed)
Cardio-Obstetrics Clinic  New Evaluation  Date:  06/28/2022   ID:  Jessica Barron, DOB July 11, 1980, MRN [id number]  PCP:  Joaquim Nam, MD   [facility] Providers Cardiologist:  None  Electrophysiologist:  None       Referring MD: Tereso Newcomer, MD   Chief Complaint: " Pregnancy cardiovascular care"  History of Present Illness:    Jessica Barron is a 42 y.o. female [G2P1001] who is being seen today for the evaluation of cardiovascular care in pregnancy at the request of Anyanwu, Jethro Bastos, MD.   Medical history includes Raynaud's syndrome, GERD, anxiety, hyperlipidemia, obesity here today to be evaluated.  This is her second baby which she delivered 11 years ago, via C-section.  She notes that she was doing well.  Recently she forced palpitations.  It was intermittent short-lived.  No lightheadedness or dizziness.  Prior CV Studies Reviewed: The following studies were reviewed today: none  Past Medical History:  Diagnosis Date   Abnormal Pap smear    repeat WNL   Constipation 02/18/2018   Depression    FH: bladder cancer 02/18/2018   GERD (gastroesophageal reflux disease)    Hip pain 01/03/2021   History of PCOS    Hyperlipidemia 02/11/2016   Menorrhagia with regular cycle 01/26/2022   PCOS (polycystic ovarian syndrome) 09/11/2013   Raynaud's phenomenon 01/03/2021   Rosacea 01/01/2021    Past Surgical History:  Procedure Laterality Date   CESAREAN SECTION  02/25/2011   Procedure: CESAREAN SECTION;  Surgeon: Leslie Andrea, MD;  Location: [facility];  Service: Gynecology;  Laterality: N/A;   CHOLECYSTECTOMY        OB History     Gravida  2   Para  1   Term  1   Preterm  0   AB  0   Living  1      SAB  0   IAB  0   Ectopic  0   Multiple  0   Live Births  1               Current Medications: Current Meds  Medication Sig   aspirin EC 81 MG tablet Take 1 tablet (81 mg total) by mouth at bedtime. Start taking when you are  [redacted] weeks pregnant for rest of pregnancy for prevention of preeclampsia   Cholecalciferol (VITAMIN D3) 50 MCG (2000 UT) capsule Take 1 capsule (2,000 Units total) by mouth daily.   clindamycin (CLEOCIN) 300 MG capsule Take 2 capsules (600 mg total) by mouth 3 (three) times daily for 7 days.   Ferric Maltol 30 MG CAPS Take 1 capsule (30 mg total) by mouth 2 (two) times daily. Please take one hour before breakfast and dinner   Prenatal Vit-Fe Fumarate-FA (PRENATAL VITAMIN PLUS LOW IRON) 27-1 MG TABS Take 1 tablet by mouth daily.     Allergies:   Lexapro [escitalopram oxalate] and Wellbutrin [bupropion]   Social History   Socioeconomic History   Marital status: Significant Other    Spouse name: Not on file   Number of children: Not on file   Years of education: Not on file   Highest education level: Not on file  Occupational History   Not on file  Tobacco Use   Smoking status: Never    Passive exposure: Past   Smokeless tobacco: Never  Substance and Sexual Activity   Alcohol use: Yes    Comment: rare   Drug use: No   Sexual activity:  Yes    Birth control/protection: None  Other Topics Concern   Not on file  Social History Narrative   Married 2010- separated from husband 2020.     Social Determinants of Health   Financial Resource Strain: Not on file  Food Insecurity: Not on file  Transportation Needs: Not on file  Physical Activity: Not on file  Stress: Not on file  Social Connections: Not on file      Family History  Problem Relation Age of Onset   Bladder Cancer Mother    COPD Mother    Cancer Mother        bladder   Bladder Cancer Father    Cancer Father        bladder   Breast cancer Paternal Grandmother    Anesthesia problems Neg Hx    Colon cancer Neg Hx       ROS:   Please see the history of present illness.    Palpitations All other systems reviewed and are negative.   Labs/EKG Reviewed:    EKG:   EKG is was ordered today.  The ekg ordered today  demonstrates sinus rhythm, heart rate 89 bpm  Recent Labs: 06/21/2022: ALT 25; BUN 9; Creatinine, Ser 0.61; Hemoglobin 11.3; Platelets 400; Potassium 4.3; Sodium 138; TSH 1.200   Recent Lipid Panel Lab Results  Component Value Date/Time   CHOL 207 (H) 05/11/2022 02:39 PM   TRIG 110.0 05/11/2022 02:39 PM   HDL 54.80 05/11/2022 02:39 PM   CHOLHDL 4 05/11/2022 02:39 PM   LDLCALC 130 (H) 05/11/2022 02:39 PM   LDLCALC 140 (H) 02/16/2018 03:22 PM    Physical Exam:    VS:  BP 122/80 (BP Location: Right Arm, Patient Position: Sitting, Cuff Size: Normal)   Pulse 89   Ht 5\' 2"  (1.575 m)   Wt 236 lb 6.4 oz (107.2 kg)   LMP 03/25/2022   SpO2 99%   BMI 43.24 kg/m     Wt Readings from Last 3 Encounters:  06/28/22 236 lb 6.4 oz (107.2 kg)  06/21/22 235 lb (106.6 kg)  06/07/22 235 lb (106.6 kg)     GEN:  Well nourished, well developed in no acute distress HEENT: Normal NECK: No JVD; No carotid bruits LYMPHATICS: No lymphadenopathy CARDIAC: RRR, no murmurs, rubs, gallops RESPIRATORY:  Clear to auscultation without rales, wheezing or rhonchi  ABDOMEN: Soft, non-tender, non-distended MUSCULOSKELETAL:  No edema; No deformity  SKIN: Warm and dry NEUROLOGIC:  Alert and oriented x 3 PSYCHIATRIC:  Normal affect    Risk Assessment/Risk Calculators:     CARPREG II Risk Prediction Index Score:  1.  The patient's risk for a primary cardiac event is 5%.            ASSESSMENT & PLAN:    Palpitations Raynaud's disease  I would like to rule out a cardiovascular etiology of this palpitation, therefore at this time I would like to placed a zio patch for  7  days. In additon giving her syndrome will be better show get an echocardiogram in this patient so be able to get information on the right side of her heart in assess LV/RV function and any structural abnormalities. Once these testing have been performed amd reviewed further reccomendations will be made.   Limit pregnancy weight gain to  11- 20 pounds  Agree with preeclampsia prophylaxis.  Patient Instructions  Medication Instructions:  Your physician recommends that you continue on your current medications as directed. Please refer to the Current  Medication list given to you today.  *If you need a refill on your cardiac medications before your next appointment, please call your pharmacy*   Testing/Procedures: Your physician has requested that you have an echocardiogram. Echocardiography is a painless test that uses sound waves to create images of your heart. It provides your doctor with information about the size and shape of your heart and how well your heart's chambers and valves are working. This procedure takes approximately one hour. There are no restrictions for this procedure. Please do NOT wear cologne, perfume, aftershave, or lotions (deodorant is allowed). Please arrive 15 minutes prior to your appointment time.   ZIO XT- Long Term Monitor Instructions   Your physician has requested you wear your ZIO patch monitor___7____days.   This is a single patch monitor.  Ir[employer] supplies one patch monitor per enrollment.  Additional stickers are not available.   Please do not apply patch if you will be having a Nuclear Stress Test, Echocardiogram, Cardiac CT, MRI, or Chest Xray during the time frame you would be wearing the monitor. The patch cannot be worn during these tests.  You cannot remove and re-apply the ZIO XT patch monitor.   Your ZIO patch monitor will be sent USPS Priority mail from [facility] directly to your home address. The monitor may also be mailed to a PO BOX if home delivery is not available.   It may take 3-5 days to receive your monitor after you have been enrolled.   Once you have received you monitor, please review enclosed instructions.  Your monitor has already been registered assigning a specific monitor serial # to you.   Applying the monitor   Shave hair from upper left chest.    Hold abrader disc by orange tab.  Rub abrader in 40 strokes over left upper chest as indicated in your monitor instructions.   Clean area with 4 enclosed alcohol pads .  Use all pads to assure are is cleaned thoroughly.  Let dry.   Apply patch as indicated in monitor instructions.  Patch will be place under collarbone on left side of chest with arrow pointing upward.   Rub patch adhesive wings for 2 minutes.Remove white label marked "1".  Remove white label marked "2".  Rub patch adhesive wings for 2 additional minutes.   While looking in a mirror, press and release button in center of patch.  A small green light will flash 3-4 times .  This will be your only indicator the monitor has been turned on.     Do not shower for the first 24 hours.  You may shower after the first 24 hours.   Press button if you feel a symptom. You will hear a small click.  Record Date, Time and Symptom in the Patient Log Book.   When you are ready to remove patch, follow instructions on last 2 pages of Patient Log Book.  Stick patch monitor onto last page of Patient Log Book.   Place Patient Log Book in [place] box.  Use locking tab on box and tape box closed securely.  The [employer] and [employer] has [employer] on it.  Please place in mailbox as soon as possible.  Your physician should have your test results approximately 7 days after the monitor has been mailed back to [facility].   Call [facility] Customer Care at [phone] if you have questions regarding your ZIO XT patch monitor.  Call them immediately if you see an orange light  blinking on your monitor.   If your monitor falls off in less than 4 days contact our Monitor department at [phone].  If your monitor becomes loose or falls off after 4 days call [employer] at [phone] for suggestions on securing your monitor.     Follow-Up: At [facility], you and your health needs are our priority.  As part of our continuing mission to  provide you with exceptional heart care, we have created designated Provider Care Teams.  These Care Teams include your primary Cardiologist (physician) and Advanced Practice Providers (APPs -  Physician Assistants and Nurse Practitioners) who all work together to provide you with the care you need, when you need it.  We recommend signing up for the patient portal called "MyChart".  Sign up information is provided on this After Visit Summary.  MyChart is used to connect with patients for Virtual Visits (Telemedicine).  Patients are able to view lab/test results, encounter notes, upcoming appointments, etc.  Non-urgent messages can be sent to your provider as well.   To learn more about what you can do with MyChart, go to [email or web address].    Your next appointment:   4 month(s)  Provider:   DR. Thomasene Ripple      Dispo:  No follow-ups on file.   Medication Adjustments/Labs and Tests Ordered: Current medicines are reviewed at length with the patient today.  Concerns regarding medicines are outlined above.  Tests Ordered: No orders of the defined types were placed in this encounter.  Medication Changes: No orders of the defined types were placed in this encounter.

## 2022-06-28 NOTE — Progress Notes (Unsigned)
Enrolled patient for a 7 day Zio XT monitor to be mailed to patients home.  

## 2022-07-01 LAB — PANORAMA PRENATAL TEST FULL PANEL:PANORAMA TEST PLUS 5 ADDITIONAL MICRODELETIONS: FETAL FRACTION: 3

## 2022-07-04 LAB — HORIZON CUSTOM: REPORT SUMMARY: NEGATIVE

## 2022-07-06 ENCOUNTER — Encounter: Payer: Self-pay | Admitting: Surgery

## 2022-07-06 ENCOUNTER — Ambulatory Visit (INDEPENDENT_AMBULATORY_CARE_PROVIDER_SITE_OTHER): Payer: Medicaid Other | Admitting: Obstetrics and Gynecology

## 2022-07-06 ENCOUNTER — Ambulatory Visit (INDEPENDENT_AMBULATORY_CARE_PROVIDER_SITE_OTHER): Payer: Medicaid Other | Admitting: Surgery

## 2022-07-06 VITALS — BP 131/83 | HR 101 | Wt 237.0 lb

## 2022-07-06 VITALS — BP 127/85 | HR 106 | Temp 98.0°F | Ht 62.0 in | Wt 235.4 lb

## 2022-07-06 DIAGNOSIS — L732 Hidradenitis suppurativa: Secondary | ICD-10-CM

## 2022-07-06 DIAGNOSIS — L0232 Furuncle of buttock: Secondary | ICD-10-CM | POA: Diagnosis not present

## 2022-07-06 DIAGNOSIS — Z3A14 14 weeks gestation of pregnancy: Secondary | ICD-10-CM

## 2022-07-06 DIAGNOSIS — L0231 Cutaneous abscess of buttock: Secondary | ICD-10-CM | POA: Diagnosis not present

## 2022-07-06 DIAGNOSIS — Z6841 Body Mass Index (BMI) 40.0 and over, adult: Secondary | ICD-10-CM

## 2022-07-06 MED ORDER — CLINDAMYCIN HCL 150 MG PO CAPS: 150.0000 mg | ORAL_CAPSULE | Freq: Three times a day (TID) | ORAL | 0 refills | Status: AC

## 2022-07-06 NOTE — Patient Instructions (Addendum)
We have seen you today for an Abscess. Please see information included below.  Please follow-up in 1 week. Your appointment information is below. You will need to pack the area daily if able. Warm compresses and soaks and work the area to keep it open. May soak in Epsom salts.   Continue your antibiotics until complete. If you have any questions or concerns prior to your next appointment, please call our office and speak with a nurse.  Skin Abscess A skin abscess is an infected area on or under your skin that contains a collection of pus and other material. An abscess may also be called a furuncle, carbuncle, or boil. An abscess can occur in or on almost any part of your body. Some abscesses break open (rupture) on their own. Most continue to get worse unless they are treated. The infection can spread deeper into the body and eventually into your blood, which can make you feel ill. Treatment usually involves draining the abscess. What are the causes? An abscess occurs when germs, often bacteria, pass through your skin and cause an infection. This may be caused by: A scrape or cut on your skin. A puncture wound through your skin, including a needle injection. Blocked oil or sweat glands. Blocked and infected hair follicles. A cyst that forms beneath your skin (sebaceous cyst) and becomes infected.  What increases the risk? This condition is more likely to develop in people who: Have a weak body defense system (immune system). Have diabetes. Have dry and irritated skin. Get frequent injections or use illegal IV drugs. Have a foreign body in a wound, such as a splinter. Have problems with their lymph system or veins.  What are the signs or symptoms? An abscess may start as a painful, firm bump under the skin. Over time, the abscess may get larger or become softer. Pus may appear at the top of the abscess, causing pressure and pain. It may eventually break through the skin and drain. Other  symptoms include: Redness. Warmth. Swelling. Tenderness. A sore on the skin.  How is this diagnosed? This condition is diagnosed based on your medical history and a physical exam. A sample of pus may be taken from the abscess to find out what is causing the infection and what antibiotics can be used to treat it. You also may have: Blood tests to look for signs of infection or spread of an infection to your blood. Imaging studies such as ultrasound, CT scan, or MRI if the abscess is deep.  How is this treated? Small abscesses that drain on their own may not need treatment. Treatment for an abscess that does not rupture on its own may include: Warm compresses applied to the area several times per day. Incision and drainage. Your health care provider will make an incision to open the abscess and will remove pus and any foreign body or dead tissue. The incision area may be packed with gauze to keep it open for a few days while it heals. Antibiotic medicines to treat infection. For a severe abscess, you may first get antibiotics through an IV and then change to oral antibiotics.  Follow these instructions at home: Abscess Care If you have an abscess that has not drained, place a warm, clean, wet washcloth over the abscess several times a day. Do this as told by your health care provider. Follow instructions from your health care provider about how to take care of your abscess. Make sure you: Cover the abscess with  a bandage (dressing). Change your dressing or gauze as told by your health care provider. Wash your hands with soap and water before you change the dressing or gauze. If soap and water are not available, use hand sanitizer. Check your abscess every day for signs of a worsening infection. Check for: More redness, swelling, or pain. More fluid or blood. Warmth. More pus or a bad smell. Medicines Take over-the-counter and prescription medicines only as told by your health care  provider. If you were prescribed an antibiotic medicine, take it as told by your health care provider. Do not stop taking the antibiotic even if you start to feel better. General instructions To avoid spreading the infection: Do not share personal care items, towels, or hot tubs with others. Avoid making skin contact with other people. Keep all follow-up visits as told by your health care provider. This is important. Contact a health care provider if: You have more redness, swelling, or pain around your abscess. You have more fluid or blood coming from your abscess. Your abscess feels warm to the touch. You have more pus or a bad smell coming from your abscess. You have a fever. You have muscle aches. You have chills or a general ill feeling. Get help right away if: You have severe pain. You see red streaks on your skin spreading away from the abscess. This information is not intended to replace advice given to you by your health care provider. Make sure you discuss any questions you have with your health care provider. Document Released: 10/20/2004 Document Revised: 09/06/2015 Document Reviewed: 11/19/2014 Elsevier Interactive Patient Education  [employer].

## 2022-07-06 NOTE — Progress Notes (Signed)
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 G2P1 at 14/5 weeks with above CC. PMHx significant for BMI 40s.  Patient states she's had this left buttock boil for at least a year and it waxes and wanes in intensity and drainage. She states that, prior to becoming pregnant, she was on chronic doxy for her rosacea and she believes that this helped it out; she states that it is always on the left. Currently, it is draining.  At last OB appointment, clinda 600 tid sent in but she doesn't believe it helped any  Review of Systems: as noted in the History of Present Illness.   Patient Active Problem List   Diagnosis Date Noted   Advanced maternal age in multigravida 06/21/2022   History of postpartum severe preeclampsia 06/21/2022   H/O macrosomia in infant in prior pregnancy, currently pregnant 06/21/2022   Maternal iron deficiency anemia affecting pregnancy, antepartum 06/08/2022   Supervision of high risk pregnancy, antepartum 05/18/2022   Previous cesarean delivery, antepartum 05/18/2022   Maternal morbid obesity, antepartum (HCC) 05/18/2022   GERD (gastroesophageal reflux disease) 01/28/2022   Hip pain 01/03/2021   Medications:  Shaye Elling had no medications administered during this visit. Current Outpatient Medications  Medication Sig Dispense Refill   aspirin EC 81 MG tablet Take 1 tablet (81 mg total) by mouth at bedtime. Start taking when you are [redacted] weeks pregnant for rest of pregnancy for prevention of preeclampsia 300 tablet 2   Cholecalciferol (VITAMIN D3) 50 MCG (2000 UT) capsule Take 1 capsule (2,000 Units total) by mouth daily.     Ferric Maltol 30 MG CAPS Take 1 capsule (30 mg total) by mouth 2 (two) times daily.  Please take one hour before breakfast and dinner 60 capsule 2   Prenatal Vit-Fe Fumarate-FA (PRENATAL VITAMIN PLUS LOW IRON) 27-1 MG TABS Take 1 tablet by mouth daily. 100 tablet 2   No current facility-administered medications for this visit.    Allergies: is allergic to lexapro [escitalopram oxalate] and wellbutrin [bupropion].  Physical Exam:  BP 131/83   Pulse (!) 101   Wt 237 lb (107.5 kg)   LMP 03/25/2022   BMI 43.35 kg/m  Body mass index is 43.35 kg/m. Fetal heart tones 146 General appearance: Well nourished, well developed female in no acute distress.  Neuro/Psych:  Normal mood and affect.    Pelvic exam:  exam performed in the presence of a chaperone Left with 5cm in length x 3-4cm in width indurated area with some purplish discoloration, there is a pinpoint spot that is spontaneously draining yellow/sero-sang discharge. The area is minimally ttp  Right buttock normal  Assessment: patient stable  Plan:  1. Boil of buttock Hygiene with sitz baths, per bottle usage and keeping the area clean and dry. I told her that draining is good and we want to encourage this. Follow up swab and will see if ppx abx are useful in her situation. Recommend Gen surg consult to establish care as concern that at some point it may evolve into a peri-rectal abscess at some point in the pregnancy - Anaerobic and Aerobic Culture - Ambulatory referral to General Surgery  2. [redacted] weeks gestation of pregnancy  3. BMI 40.0-44.9, adult (HCC) Recommend watching weight is helpful as well -  Ambulatory referral to General Surgery  No follow-ups on file.  Future Appointments  Date Time Provider Department Center  07/19/2022 10:55 AM Minden Bing, MD [facility]  08/03/2022 10:20 AM [facility] ECHO 2 [id number]  08/08/2022  2:15 PM [facility] NURSE [facility]  08/08/2022  2:30 PM [facility] US2 [facility]  08/16/2022  4:10 PM Anyanwu, Jethro Bastos, MD [facility]   10/28/2022  9:40 AM Tobb, Lavona Mound, DO [facility] None    Cornelia Copa MD Attending Center for [facility])  ***

## 2022-07-06 NOTE — Progress Notes (Signed)
ROB   Pt notes "not feeling fresh" last vaginal swab was WNL  CC: vaginal discharge and odor using Lume, has boil on buttocks has been a issue x 1 yr very painful.

## 2022-07-07 ENCOUNTER — Encounter: Payer: Self-pay | Admitting: Cardiology

## 2022-07-09 LAB — ANAEROBIC AND AEROBIC CULTURE

## 2022-07-12 ENCOUNTER — Encounter: Payer: Self-pay | Admitting: Obstetrics and Gynecology

## 2022-07-12 ENCOUNTER — Encounter: Payer: Self-pay | Admitting: Surgery

## 2022-07-12 LAB — ANAEROBIC AND AEROBIC CULTURE

## 2022-07-12 NOTE — Progress Notes (Signed)
Patient ID: Jessica Barron, female   DOB: [birth year], 42 y.o.   MRN: [id number]  HPI Jessica Barron is a 42 y.o. female seen in consultation at the request of Dr. Vergie Living.  She is [redacted] weeks pregnant.  Reports pain in the left buttocks for several months close to a year.  It is intermittent and sometimes gets worse.  At this time she feels a lump and is exquisitely tender.  No fevers no chills.  She does have Rosacea and was on chronic doxycycline.  Also reports some drainage from the perianal area. SHe was evaluated by obstetrics recently have been on clindamycin with minimal relief.  She also states that the pain is sharp mild to moderate intensity and worsening when she sits.  SHe had normal recent CBC and CMP.  HPI  Past Medical History:  Diagnosis Date   Abnormal Pap smear    repeat WNL   Constipation 02/18/2018   Depression    FH: bladder cancer 02/18/2018   GERD (gastroesophageal reflux disease)    Hip pain 01/03/2021   History of PCOS    Hyperlipidemia 02/11/2016   Menorrhagia with regular cycle 01/26/2022   PCOS (polycystic ovarian syndrome) 09/11/2013   Raynaud's phenomenon 01/03/2021   Rosacea 01/01/2021    Past Surgical History:  Procedure Laterality Date   CESAREAN SECTION  02/25/2011   Procedure: CESAREAN SECTION;  Surgeon: Leslie Andrea, MD;  Location: [facility];  Service: Gynecology;  Laterality: N/A;   CHOLECYSTECTOMY      Family History  Problem Relation Age of Onset   Bladder Cancer Mother    COPD Mother    Cancer Mother        bladder   Bladder Cancer Father    Cancer Father        bladder   Breast cancer Paternal Grandmother    Anesthesia problems Neg Hx    Colon cancer Neg Hx     Social History Social History   Tobacco Use   Smoking status: Never    Passive exposure: Past   Smokeless tobacco: Never  Substance Use Topics   Alcohol use: Yes    Comment: rare   Drug use: No    Allergies  Allergen Reactions   Lexapro [Escitalopram Oxalate]  Other (See Comments)    Blunted affect   Wellbutrin [Bupropion] Other (See Comments)    intolerant    Current Outpatient Medications  Medication Sig Dispense Refill   aspirin EC 81 MG tablet Take 1 tablet (81 mg total) by mouth at bedtime. Start taking when you are [redacted] weeks pregnant for rest of pregnancy for prevention of preeclampsia 300 tablet 2   Cholecalciferol (VITAMIN D3) 50 MCG (2000 UT) capsule Take 1 capsule (2,000 Units total) by mouth daily.     clindamycin (CLEOCIN) 150 MG capsule Take 1 capsule (150 mg total) by mouth 3 (three) times daily for 7 days. 21 capsule 0   Prenatal Vit-Fe Fumarate-FA (PRENATAL VITAMIN PLUS LOW IRON) 27-1 MG TABS Take 1 tablet by mouth daily. 100 tablet 2   No current facility-administered medications for this visit.     Review of Systems Full ROS  was asked and was negative except for the information on the HPI  Physical Exam Blood pressure 127/85, pulse (!) 106, temperature 98 F (36.7 C), temperature source Oral, height 5\' 2"  (1.575 m), weight 235 lb 6.4 oz (106.8 kg), last menstrual period 03/25/2022, SpO2 99 %. CONSTITUTIONAL: NAD. EYES: Pupils are equal, round,  Sclera are  non-icteric. EARS, NOSE, MOUTH AND THROAT: The oropharynx is clear. The oral mucosa is pink and moist. Hearing is intact to voice. LYMPH NODES:  Lymph nodes in the neck are normal. RESPIRATORY:  Lungs are clear. There is normal respiratory effort, with equal breath sounds bilaterally, and without pathologic use of accessory muscles. CARDIOVASCULAR: Heart is regular without murmurs, gallops, or rubs. GI: The abdomen is  soft, nontender, and nondistended. There are no palpable masses. There is no hepatosplenomegaly. There are normal bowel sounds in all quadrants. GU: Rectal  no masses, there is a left anterolateral perirectal abscess, fluctuant w erythema and tender to palpation, no evidence of necrotizing infection , there is also some changes consistent with  hidradenitis MUSCULOSKELETAL: Normal muscle strength and tone. No cyanosis or edema.   SKIN: Turgor is good and there are no pathologic skin lesions or ulcers. NEUROLOGIC: Motor and sensation is grossly normal. Cranial nerves are grossly intact. PSYCH:  Oriented to person, place and time. Affect is normal.  Data Reviewed  I have personally reviewed the patient's imaging, laboratory findings and medical records.    Assessment/Plan 42 year old female IUP with perirectal abscess in need for incision and drainage.  Discussed with the patient in detail about her disease process.  Options of performing I&D here in the office versus general ventricular anesthesia.  After discussion with her she wishes to have it done here under local anesthetic in the office.  Does have some hidradenitis that likely is contributing to the perirectal abscess. Please note that I spent 45 minutes in this encounter including personally reviewing the records, coordinating her care, placing orders and performing appropriate documentation  PROCEDURE NOTE  PRE-OPERATIVE DIAGNOSIS:  Perirectal abscess  POST-OPERATIVE DIAGNOSIS:  Same  PROCEDURE:  1. Incision and drainage of perirectal abscess 2. excisional debridement of skin subcutaneous tissue and fascia measuring 4 square centimeters    ANESTHESIA: Lidocaine 1% w epi  FINDINGS: PERIRECTAL abscess and hidradenitis Patient was explained about the procedure in detail; risks, benefits possible complications and a consent was obtained. The patient taken to the procedure  room and placed in the lithotomy position. SHe was prepped and draped in the standard fashion , incision was created and pus was drained and cultured. There was complex luculations that we were able to lyse with a f finger fracture a. All the loculations were broken down. Using a metz  we debrided the sub q tissue down to the fascia. Hemostasis was obtained with pressure. Irrigation with normal saline and  the wound was packed with half-inch packing. There were no immediate complications  Leafy Ro, MD      Sterling Big, MD FACS General Surgeon 07/12/2022, 7:42 AM

## 2022-07-13 ENCOUNTER — Ambulatory Visit (INDEPENDENT_AMBULATORY_CARE_PROVIDER_SITE_OTHER): Payer: Medicaid Other | Admitting: Surgery

## 2022-07-13 ENCOUNTER — Encounter: Payer: Self-pay | Admitting: Surgery

## 2022-07-13 VITALS — BP 120/82 | HR 101 | Temp 98.1°F | Ht 62.0 in | Wt 236.4 lb

## 2022-07-13 DIAGNOSIS — L0231 Cutaneous abscess of buttock: Secondary | ICD-10-CM

## 2022-07-13 DIAGNOSIS — Z09 Encounter for follow-up examination after completed treatment for conditions other than malignant neoplasm: Secondary | ICD-10-CM

## 2022-07-13 LAB — ANAEROBIC AND AEROBIC CULTURE

## 2022-07-13 NOTE — Patient Instructions (Signed)
If you have any concerns or questions, please feel free to call our office.   Hidradenitis Suppurativa Hidradenitis suppurativa is a long-term (chronic) skin disease. It is similar to a severe form of acne, but it affects areas of the body where acne would be unusual, especially areas of the body where skin rubs against skin and becomes moist. These include: Underarms. Groin. Genital area. Buttocks. Upper thighs. Breasts. Hidradenitis suppurativa may start out as small lumps or pimples caused by blocked skin pores, sweat glands, or hair follicles. Pimples may develop into deep sores that break open (rupture) and drain pus. Over time, affected areas of skin may thicken and become scarred. This condition is rare and does not spread from person to person (non-contagious). What are the causes? The exact cause of this condition is not known. It may be related to: Female and female hormones. An overactive disease-fighting system (immune system). The immune system may over-react to blocked hair follicles or sweat glands and cause swelling and pus-filled sores. What increases the risk? You are more likely to develop this condition if you: Are female. Are 24-50 years old. Have a family history of hidradenitis suppurativa. Have a personal history of acne. Are overweight. Smoke. Take the medicine lithium. What are the signs or symptoms? The first symptoms are usually painful bumps in the skin, similar to pimples. The condition may get worse over time (progress), or it may only cause mild symptoms. If the disease progresses, symptoms may include: Skin bumps getting bigger and growing deeper into the skin. Bumps rupturing and draining pus. Itchy, infected skin. Skin getting thicker and scarred. Tunnels under the skin (fistulas) where pus drains from a bump. Pain during daily activities, such as pain during walking if your groin area is affected. Emotional problems, such as stress or depression. This  condition may affect your appearance and your ability or willingness to wear certain clothes or do certain activities. How is this diagnosed? This condition is diagnosed by a health care provider who specializes in skin conditions (dermatologist). You may be diagnosed based on: Your symptoms and medical history. A physical exam. Testing a pus sample for infection. Blood tests. How is this treated? Your treatment will depend on how severe your symptoms are. The same treatment will not work for everybody with this condition. You may need to try several treatments to find what works best for you. Treatment may include: Cleaning and bandaging (dressing) your wounds as needed. Lifestyle changes, such as new skin care routines. Taking medicines, such as: Antibiotics. Acne medicines. Medicines to reduce the activity of the immune system. A diabetes medicine (metformin). Birth control pills, for women. Steroids to reduce swelling and pain. Working with a mental health care provider, if you experience emotional distress due to this condition. If you have severe symptoms that do not get better with medicine, you may need surgery. Surgery may involve: Using a laser to clear the skin and remove hair follicles. Opening and draining deep sores. Removing the areas of skin that are diseased and scarred. Follow these instructions at home: Medicines  Take over-the-counter and prescription medicines only as told by your health care provider. If you were prescribed antibiotics, take them as told by your health care provider. Do not stop using the antibiotic even if your condition improves. Skin care If you have open wounds, cover them with a clean dressing as told by your health care provider. Keep wounds clean by washing them gently with soap and water when you bathe.  Do not shave the areas where you get hidradenitis suppurativa. Wear loose-fitting clothes. Try to avoid getting overheated or sweaty. If  you get sweaty or wet, change into clean, dry clothes as soon as you can. To help relieve pain and itchiness, cover sore areas with a warm, clean washcloth (warm compress) for 5-10 minutes as often as needed. Your healthcare provider may recommend an antiperspirant deodorant that may be gentle on your skin. A daily antiseptic wash to cleanse affected areas may be suggested by your healthcare provider. General instructions Learn as much as you can about your disease so that you have an active role in your treatment. Work closely with your health care provider to find treatments that work for you. If you are overweight, work with your health care provider to lose weight as recommended. Do not use any products that contain nicotine or tobacco. These products include cigarettes, chewing tobacco, and vaping devices, such as e-cigarettes. If you need help quitting, ask your health care provider. If you struggle with living with this condition, talk with your health care provider or work with a mental health care provider as recommended. Keep all follow-up visits. Where to find more information Hidradenitis Suppurativa Foundation, Inc.: www.hs-foundation.org American Academy of Dermatology: InfoExam.si Contact a health care provider if: You have a flare-up of hidradenitis suppurativa. You have a fever or chills. You have trouble controlling your symptoms at home. You have trouble doing your daily activities because of your symptoms. You have trouble dealing with emotional problems related to your condition. Summary Hidradenitis suppurativa is a long-term (chronic) skin disease. It is similar to a severe form of acne, but it affects areas of the body where acne would be unusual. The first symptoms are usually painful bumps in the skin, similar to pimples. The condition may only cause mild symptoms, or it may get worse over time (progress). If you have open wounds, cover them with a clean dressing as  told by your health care provider. Keep wounds clean by washing them gently with soap and water when you bathe. Besides skin care, treatment may include medicines, laser treatment, and surgery. This information is not intended to replace advice given to you by your health care provider. Make sure you discuss any questions you have with your health care provider. Document Revised: 03/03/2021 Document Reviewed: 03/03/2021 Elsevier Patient Education  2024 [employer].

## 2022-07-14 NOTE — Progress Notes (Signed)
S/p I/D perirectal Chronic hidradenities Husband felt another knot AVSS  PE NAD, chaperone present' WOund healing well, good granulation tissue , packing removed and repalced w Band-aid given its superficial depth.there is an adjacent area of chronic hidradenitis on the Left inner thigh, no evidence of abscess  A/p Doing well w/o complications Encourage to keep Derm appt RTC prn

## 2022-07-19 ENCOUNTER — Ambulatory Visit (INDEPENDENT_AMBULATORY_CARE_PROVIDER_SITE_OTHER): Payer: Medicaid Other | Admitting: Obstetrics and Gynecology

## 2022-07-19 VITALS — BP 130/83 | HR 102 | Wt 237.0 lb

## 2022-07-19 DIAGNOSIS — L732 Hidradenitis suppurativa: Secondary | ICD-10-CM

## 2022-07-19 DIAGNOSIS — Z3A16 16 weeks gestation of pregnancy: Secondary | ICD-10-CM

## 2022-07-19 DIAGNOSIS — O09522 Supervision of elderly multigravida, second trimester: Secondary | ICD-10-CM

## 2022-07-19 DIAGNOSIS — O0993 Supervision of high risk pregnancy, unspecified, third trimester: Secondary | ICD-10-CM | POA: Diagnosis not present

## 2022-07-19 DIAGNOSIS — O99013 Anemia complicating pregnancy, third trimester: Secondary | ICD-10-CM | POA: Diagnosis not present

## 2022-07-19 DIAGNOSIS — D509 Iron deficiency anemia, unspecified: Secondary | ICD-10-CM

## 2022-07-19 DIAGNOSIS — K611 Rectal abscess: Secondary | ICD-10-CM | POA: Insufficient documentation

## 2022-07-19 DIAGNOSIS — O99019 Anemia complicating pregnancy, unspecified trimester: Secondary | ICD-10-CM

## 2022-07-19 DIAGNOSIS — O34219 Maternal care for unspecified type scar from previous cesarean delivery: Secondary | ICD-10-CM | POA: Diagnosis not present

## 2022-07-19 DIAGNOSIS — O09293 Supervision of pregnancy with other poor reproductive or obstetric history, third trimester: Secondary | ICD-10-CM | POA: Diagnosis not present

## 2022-07-19 DIAGNOSIS — O0990 Supervision of high risk pregnancy, unspecified, unspecified trimester: Secondary | ICD-10-CM | POA: Diagnosis not present

## 2022-07-19 DIAGNOSIS — I7300 Raynaud's syndrome without gangrene: Secondary | ICD-10-CM | POA: Diagnosis not present

## 2022-07-19 DIAGNOSIS — Z3A33 33 weeks gestation of pregnancy: Secondary | ICD-10-CM | POA: Diagnosis not present

## 2022-07-19 DIAGNOSIS — O09523 Supervision of elderly multigravida, third trimester: Secondary | ICD-10-CM | POA: Diagnosis not present

## 2022-07-19 DIAGNOSIS — O24419 Gestational diabetes mellitus in pregnancy, unspecified control: Secondary | ICD-10-CM | POA: Diagnosis not present

## 2022-07-19 DIAGNOSIS — O99213 Obesity complicating pregnancy, third trimester: Secondary | ICD-10-CM | POA: Diagnosis not present

## 2022-07-19 HISTORY — DX: Rectal abscess: K61.1

## 2022-07-19 NOTE — Progress Notes (Signed)
ROB  CC: Wanting an ultrasound sooner to see the baby

## 2022-07-19 NOTE — Progress Notes (Addendum)
   PRENATAL VISIT NOTE  Subjective:  Jessica Barron is a 42 y.o. G2P1001 at [redacted]w[redacted]d being seen today for ongoing prenatal care.  She is currently monitored for the following issues for this high-risk pregnancy and has Hip pain; GERD (gastroesophageal reflux disease); Supervision of high risk pregnancy, antepartum; Previous cesarean delivery, antepartum; Maternal morbid obesity, antepartum (HCC); Maternal iron deficiency anemia affecting pregnancy, antepartum; Advanced maternal age in multigravida; History of postpartum severe preeclampsia; H/O macrosomia in infant in prior pregnancy, currently pregnant; and Perirectal abscess on their problem list.  Patient reports  hip and back discomfort .  Contractions: Not present. Vag. Bleeding: None.   . Denies leaking of fluid.   The following portions of the patient's history were reviewed and updated as appropriate: allergies, current medications, past family history, past medical history, past social history, past surgical history and problem list.   Objective:   Vitals:   07/19/22 1114  BP: 130/83  Pulse: (!) 102  Weight: 237 lb (107.5 kg)    Fetal Status: Fetal Heart Rate (bpm): 149         General:  Alert, oriented and cooperative. Patient is in no acute distress.  Skin: Skin is warm and dry. No rash noted.   Cardiovascular: Normal heart rate noted  Respiratory: Normal respiratory effort, no problems with respiration noted  Abdomen: Soft, gravid, appropriate for gestational age.  Pain/Pressure: Absent     Pelvic: Cervical exam deferred        Extremities: Normal range of motion.  Edema: None  Mental Status: Normal mood and affect. Normal behavior. Normal judgment and thought content.   Assessment and Plan:  Pregnancy: G2P1001 at [redacted]w[redacted]d 1. Supervision of high risk pregnancy, antepartum F/u anatomy u/s. Continue low dose ASA. Pt works as Financial trader, strategies d/w to try and help with this in addition to random episodes of lethargy and  tachycardia like in prior pregnancies. If no change after adequate hydration and snacking during day, consider cardiology consult.  - AFP, Serum, Open Spina Bifida  2. [redacted] weeks gestation of pregnancy  3. Multigravida of advanced maternal age in second trimester Low risk panorama; pt amenable to afp  4. Previous cesarean delivery, antepartum  5. Perirectal abscess S/p I&D 6/12 by Gen Surg. Culture negative  6. Hidradenitis See above  Preterm labor symptoms and general obstetric precautions including but not limited to vaginal bleeding, contractions, leaking of fluid and fetal movement were reviewed in detail with the patient. Please refer to After Visit Summary for other counseling recommendations.   No follow-ups on file.  Future Appointments  Date Time Provider Department Center  08/03/2022 10:20 AM [facility] ECHO 2 [id number]  08/08/2022  2:15 PM [facility] NURSE [facility]  08/08/2022  2:30 PM [facility] US2 [facility]  08/16/2022  4:10 PM Anyanwu, Jethro Bastos, MD [facility]  10/28/2022  9:40 AM Tobb, Lavona Mound, DO [facility] None     Bing, MD

## 2022-07-21 LAB — AFP, SERUM, OPEN SPINA BIFIDA
AFP MoM: 0.91
AFP Value: 24.6 ng/mL
Gest. Age on Collection Date: 16.4 weeks
Maternal Age At EDD: 43.2 yr
OSBR Risk 1 IN: 10000
Test Results:: NEGATIVE
Weight: 237 [lb_av]

## 2022-08-02 ENCOUNTER — Encounter: Payer: Self-pay | Admitting: *Deleted

## 2022-08-03 ENCOUNTER — Ambulatory Visit (HOSPITAL_COMMUNITY): Payer: Medicaid Other | Attending: Internal Medicine

## 2022-08-03 DIAGNOSIS — I7300 Raynaud's syndrome without gangrene: Secondary | ICD-10-CM | POA: Diagnosis not present

## 2022-08-03 LAB — ECHOCARDIOGRAM COMPLETE
Area-P 1/2: 3.89 cm2
S' Lateral: 3.4 cm

## 2022-08-08 ENCOUNTER — Other Ambulatory Visit: Payer: Self-pay | Admitting: *Deleted

## 2022-08-08 ENCOUNTER — Encounter: Payer: Self-pay | Admitting: *Deleted

## 2022-08-08 ENCOUNTER — Ambulatory Visit: Payer: Medicaid Other | Admitting: *Deleted

## 2022-08-08 ENCOUNTER — Encounter: Payer: Self-pay | Admitting: Obstetrics & Gynecology

## 2022-08-08 ENCOUNTER — Ambulatory Visit: Payer: Medicaid Other | Attending: Obstetrics & Gynecology

## 2022-08-08 VITALS — BP 128/66 | HR 94

## 2022-08-08 DIAGNOSIS — Z3A19 19 weeks gestation of pregnancy: Secondary | ICD-10-CM

## 2022-08-08 DIAGNOSIS — O0990 Supervision of high risk pregnancy, unspecified, unspecified trimester: Secondary | ICD-10-CM | POA: Insufficient documentation

## 2022-08-08 DIAGNOSIS — O09521 Supervision of elderly multigravida, first trimester: Secondary | ICD-10-CM | POA: Insufficient documentation

## 2022-08-08 DIAGNOSIS — O09522 Supervision of elderly multigravida, second trimester: Secondary | ICD-10-CM

## 2022-08-08 DIAGNOSIS — O99210 Obesity complicating pregnancy, unspecified trimester: Secondary | ICD-10-CM | POA: Diagnosis not present

## 2022-08-08 DIAGNOSIS — O99212 Obesity complicating pregnancy, second trimester: Secondary | ICD-10-CM | POA: Diagnosis not present

## 2022-08-08 DIAGNOSIS — O34219 Maternal care for unspecified type scar from previous cesarean delivery: Secondary | ICD-10-CM | POA: Insufficient documentation

## 2022-08-08 DIAGNOSIS — O09292 Supervision of pregnancy with other poor reproductive or obstetric history, second trimester: Secondary | ICD-10-CM

## 2022-08-14 ENCOUNTER — Encounter: Payer: Self-pay | Admitting: Obstetrics & Gynecology

## 2022-08-16 ENCOUNTER — Encounter: Payer: Medicaid Other | Admitting: Obstetrics & Gynecology

## 2022-08-30 ENCOUNTER — Other Ambulatory Visit (HOSPITAL_COMMUNITY)
Admission: RE | Admit: 2022-08-30 | Discharge: 2022-08-30 | Disposition: A | Discharge status: Home / Self Care | Payer: Medicaid Other | Source: Ambulatory Visit | Attending: Obstetrics & Gynecology | Admitting: Obstetrics & Gynecology

## 2022-08-30 ENCOUNTER — Ambulatory Visit (INDEPENDENT_AMBULATORY_CARE_PROVIDER_SITE_OTHER): Payer: Medicaid Other | Admitting: Obstetrics & Gynecology

## 2022-08-30 ENCOUNTER — Encounter: Payer: Self-pay | Admitting: Obstetrics & Gynecology

## 2022-08-30 VITALS — BP 121/83 | HR 93 | Wt 247.0 lb

## 2022-08-30 DIAGNOSIS — N898 Other specified noninflammatory disorders of vagina: Secondary | ICD-10-CM | POA: Diagnosis not present

## 2022-08-30 DIAGNOSIS — D509 Iron deficiency anemia, unspecified: Secondary | ICD-10-CM

## 2022-08-30 DIAGNOSIS — O26892 Other specified pregnancy related conditions, second trimester: Secondary | ICD-10-CM | POA: Diagnosis not present

## 2022-08-30 DIAGNOSIS — Z3A22 22 weeks gestation of pregnancy: Secondary | ICD-10-CM

## 2022-08-30 DIAGNOSIS — O0992 Supervision of high risk pregnancy, unspecified, second trimester: Secondary | ICD-10-CM

## 2022-08-30 DIAGNOSIS — O99019 Anemia complicating pregnancy, unspecified trimester: Secondary | ICD-10-CM

## 2022-08-30 DIAGNOSIS — O34219 Maternal care for unspecified type scar from previous cesarean delivery: Secondary | ICD-10-CM

## 2022-08-30 DIAGNOSIS — O99210 Obesity complicating pregnancy, unspecified trimester: Secondary | ICD-10-CM

## 2022-08-30 DIAGNOSIS — O09299 Supervision of pregnancy with other poor reproductive or obstetric history, unspecified trimester: Secondary | ICD-10-CM

## 2022-08-30 MED ORDER — FERRIC MALTOL 30 MG PO CAPS
1.0000 | ORAL_CAPSULE | Freq: Two times a day (BID) | ORAL | 2 refills | Status: DC
Start: 2022-08-30 — End: 2022-09-29

## 2022-08-30 NOTE — Progress Notes (Signed)
PRENATAL VISIT NOTE  Subjective:  Jessica Barron is a 42 y.o. G2P1001 at [redacted]w[redacted]d being seen today for ongoing prenatal care. Here with FOB.  She is currently monitored for the following issues for this high-risk pregnancy and has GERD (gastroesophageal reflux disease); Supervision of high-risk pregnancy; Previous cesarean delivery, antepartum; Maternal morbid obesity, antepartum (HCC); Iron deficiency anemia of pregnancy; Advanced maternal age in multigravida; History of postpartum severe preeclampsia; and H/O macrosomia in infant in prior pregnancy, currently pregnant on their problem list.  Patient reports having watery vaginal discharge, has to wear pantyliner. Also has started to crave ice, had diarrhea reaction to OTC iron therapy so did not take prescribed ferric maltol.  Had low ferritin level of 12 on 06/21/22.   Contractions: Irritability. Vag. Bleeding: None.  Movement: Present. Denies leaking of fluid.   The following portions of the patient's history were reviewed and updated as appropriate: allergies, current medications, past family history, past medical history, past social history, past surgical history and problem list.   Objective:   Vitals:   08/30/22 1345  BP: 121/83  Pulse: 93  Weight: 247 lb (112 kg)    Fetal Status: Fetal Heart Rate (bpm): 148   Movement: Present     General:  Alert, oriented and cooperative. Patient is in no acute distress.  Skin: Skin is warm and dry. No rash noted.   Cardiovascular: Normal heart rate noted  Respiratory: Normal respiratory effort, no problems with respiration noted  Abdomen: Soft, gravid, appropriate for gestational age.  Pain/Pressure: Absent     Pelvic: Speculum exam performed in the presence of a chaperone. No pooling, scant white discharge noted in vagina. Negative Amnioswab, vaginitis testing sample obtained.        Extremities: Normal range of motion.  Edema: Trace  Mental Status: Normal mood and affect. Normal behavior.  Normal judgment and thought content.   Imaging: [place] MFM OB DETAIL +14 WK  Result Date: 08/08/2022 ----------------------------------------------------------------------  OBSTETRICS REPORT                       (Signed Final 08/08/2022 03:51 pm) ---------------------------------------------------------------------- Patient Info  ID #:       [id number]                          D.O.B.:  January 23, 1981 (41 yrs)  Name:       Jessica Barron                 Visit Date: 08/08/2022 02:36 pm ---------------------------------------------------------------------- Performed By  Attending:        Braxton Feathers DO       Ref. Address:     [address]                                                             [place], [place]                                                             [address]  Performed By:  Palma Holter         [facility]  Referred By:      Tereso Newcomer MD ---------------------------------------------------------------------- Orders  #  Description                           Code        Ordered By  1  [place] MFM OB DETAIL +14 WK               16109.60    Jaynie Collins ----------------------------------------------------------------------  #  Order #                     Accession #                Episode #  1  [id number]                   [id number]                 [id number] ---------------------------------------------------------------------- Indications  Advanced maternal age multigravida 82+,        O42.522  second trimester (41)  Obesity complicating pregnancy, second         O99.212  trimester (PG BMI - 43)  Poor obstetric history: Previous severe post-  O09.299  partum preeclampsia  Poor obstetric history: Prior fetal            O09.299  macrosomia,  antepartum(9# 8.6oz)  History of cesarean delivery, currently        O47.219  pregnant  Encounter for antenatal screening for          Z36.3  malformations  [redacted] weeks gestation of pregnancy                Z3A.19  LR NIPT, Neg AFP, Neg Horizon ---------------------------------------------------------------------- Vital Signs  BP:          128/66 ---------------------------------------------------------------------- Fetal Evaluation  Num Of Fetuses:         1  Fetal Heart Rate(bpm):  150  Cardiac Activity:       Observed  Presentation:           Cephalic  Placenta:  Posterior  P. Cord Insertion:      Visualized, central  Amniotic Fluid  AFI FV:      Within normal limits                              Largest Pocket(cm)                              6.13 ---------------------------------------------------------------------- Biometry  BPD:      47.7  mm     G. Age:  20w 3d         87  %    CI:        73.34   %    70 - 86                                                          FL/HC:      17.9   %    16.1 - 18.3  HC:       177   mm     G. Age:  20w 1d         77  %    HC/AC:      1.10        1.09 - 1.39  AC:      161.2  mm     G. Age:  21w 1d         92  %    FL/BPD:     66.5   %  FL:       31.7  mm     G. Age:  19w 6d         59  %    FL/AC:      19.7   %    20 - 24  HUM:      31.7  mm     G. Age:  20w 4d         82  %  CER:      20.3  mm     G. Age:  19w 4d         58  %  LV:        5.9  mm  CM:        5.1  mm  Est. FW:     361  gm    0 lb 13 oz      96  % ---------------------------------------------------------------------- OB History  Gravidity:    2         Term:   1        Prem:   0        SAB:   0  TOP:          0       Ectopic:  0        Living: 1 ---------------------------------------------------------------------- Gestational Age  LMP:           19w 3d        Date:  03/25/22                   EDD:   12/30/22  U/S Today:     20w 3d  EDD:   12/23/22  Best:           19w 3d     Det. By:  LMP  (03/25/22)          EDD:   12/30/22 ---------------------------------------------------------------------- Anatomy  Cranium:               Appears normal         LVOT:                   Not well visualized  Cavum:                 Not well visualized    Aortic Arch:            Appears normal  Ventricles:            Appears normal         Ductal Arch:            Not well visualized  Choroid Plexus:        Appears normal         Diaphragm:              Appears normal  Cerebellum:            Appears normal         Stomach:                Appears normal, left                                                                        sided  Posterior Fossa:       Appears normal         Abdomen:                Appears normal  Nuchal Fold:           Not well visualized    Abdominal Wall:         Appears nml (cord                                                                        insert, abd wall)  Face:                  Orbits nl; profile not Cord Vessels:           Appears normal (3                         well visualized                                vessel cord)  Lips:                  Not well visualized    Kidneys:  Appear normal  Palate:                Not well visualized    Bladder:                Appears normal  Thoracic:              Appears normal         Spine:                  Appears normal  Heart:                 Not well visualized    Upper Extremities:      Appears normal  RVOT:                  Not well visualized    Lower Extremities:      Appears normal  Other:  Fetus appears to be a female. Hands and feet visualized. Technically          difficult due to maternal habitus and fetal position. ---------------------------------------------------------------------- Cervix Uterus Adnexa  Cervix  Length:              4  cm.  Normal appearance by transabdominal scan  Uterus  No abnormality visualized.  Right Ovary  Not visualized.  Left Ovary  Not visualized.  Cul De Sac  No  free fluid seen.  Adnexa  No abnormality visualized ---------------------------------------------------------------------- Comments  The patient is here for an anatomy ultrasound. Her pregnancy  is complicated by AMA (41y), elevated BMI (40).  Aneuplolidy screening: low risk  AFP: neg  Sonographic findings  Single intrauterine pregnancy at 19w 3d.  Fetal cardiac activity:  Observed and appears normal.  Presentation: Cephalic.  The anatomic structures that were well seen appear normal  without evidence of soft markers. Due to poor acoustic  windows some structures remain suboptimally visualized.  Fetal biometry shows the estimated fetal weight at the 96  percentile.  Amniotic fluid:  MVP: 6.13 cm.  Placenta: Posterior.  Adnexa: No abnormality visualized.  Cervical length: 4 cm.  Recommendations  - EDD should be 12/30/2022 based on  LMP  (03/25/22).  - Follow up ultrasound in 4-6 weeks to attempt visualization of  the anatomy not seen and reassess the fetal growth.  - Antenatal testing to start around 32 weeks.  - Consider fetal echo if heart anatomy remains suboptimal.  There are limitations of prenatal ultrasound such as the  inability to detect certain abnormalities due to poor  visualization. Various factors such as fetal position,  gestational age and maternal body habitus may increase the  difficulty in visualizing the fetal anatomy. ----------------------------------------------------------------------                  Braxton Feathers, DO Electronically Signed Final Report   08/08/2022 03:51 pm ----------------------------------------------------------------------   ECHOCARDIOGRAM COMPLETE  Result Date: 08/03/2022    ECHOCARDIOGRAM REPORT   Patient Name:   [facility] Date of Exam: 08/03/2022 Medical Rec #:  [id number]       Height:       62.0 in Accession #:    [id number]      Weight:       237.0 lb Date of Birth:  11/14/1980        BSA:          2.055 m Patient Age:    41 years        BP:  130/83  mmHg Patient Gender: F               HR:           91 bpm. Exam Location:  [address] Procedure: 2D Echo, Cardiac Doppler, Color Doppler and 3D Echo Indications:    OB Patient; Raynaud's Syndrome I73  History:        Patient has no prior history of Echocardiogram examinations.                 Risk Factors:Dyslipidemia.  Sonographer:    Thurman Coyer RDCS Referring Phys: [id number] KARDIE TOBB  Sonographer Comments: Global longitudinal strain was attempted. IMPRESSIONS  1. Left ventricular ejection fraction, by estimation, is 60 to 65%. The left ventricle has normal function. The left ventricle has no regional wall motion abnormalities. Left ventricular diastolic parameters were normal.  2. Right ventricular systolic function is normal. The right ventricular size is normal.  3. The mitral valve is normal in structure. No evidence of mitral valve regurgitation.  4. The aortic valve is grossly normal. Aortic valve regurgitation is not visualized.  5. The inferior vena cava is normal in size with <50% respiratory variability, suggesting right atrial pressure of 8 mmHg. FINDINGS  Left Ventricle: Left ventricular ejection fraction, by estimation, is 60 to 65%. The left ventricle has normal function. The left ventricle has no regional wall motion abnormalities. The left ventricular internal cavity size was normal in size. There is  no left ventricular hypertrophy. Left ventricular diastolic parameters were normal. Right Ventricle: The right ventricular size is normal. Right ventricular systolic function is normal. Left Atrium: Left atrial size was normal in size. Right Atrium: Right atrial size was normal in size. Pericardium: There is no evidence of pericardial effusion. Mitral Valve: The mitral valve is normal in structure. No evidence of mitral valve regurgitation. Tricuspid Valve: Tricuspid valve regurgitation is not demonstrated. Aortic Valve: The aortic valve is grossly normal. Aortic valve regurgitation is not  visualized. Pulmonic Valve: The pulmonic valve was not well visualized. Pulmonic valve regurgitation is not visualized. Aorta: The aortic root and ascending aorta are structurally normal, with no evidence of dilitation. Venous: The inferior vena cava is normal in size with less than 50% respiratory variability, suggesting right atrial pressure of 8 mmHg. IAS/Shunts: No atrial level shunt detected by color flow Doppler.  LEFT VENTRICLE PLAX 2D LVIDd:         4.60 cm   Diastology LVIDs:         3.40 cm   LV e' medial:    8.92 cm/s LV PW:         1.00 cm   LV E/e' medial:  8.2 LV IVS:        1.00 cm   LV e' lateral:   12.80 cm/s LVOT diam:     2.00 cm   LV E/e' lateral: 5.7 LV SV:         50 LV SV Index:   24 LVOT Area:     3.14 cm                           3D Volume EF:                          3D EF:        60 %  LV EDV:       170 ml                          LV ESV:       68 ml                          LV SV:        102 ml RIGHT VENTRICLE RV Basal diam:  3.20 cm RV Mid diam:    3.10 cm RV S prime:     12.30 cm/s TAPSE (M-mode): 2.4 cm LEFT ATRIUM             Index        RIGHT ATRIUM           Index LA diam:        3.90 cm 1.90 cm/m   RA Area:     12.30 cm LA Vol (A2C):   44.1 ml 21.46 ml/m  RA Volume:   27.30 ml  13.29 ml/m LA Vol (A4C):   68.7 ml 33.44 ml/m LA Biplane Vol: 56.3 ml 27.40 ml/m  AORTIC VALVE LVOT Vmax:   92.40 cm/s LVOT Vmean:  59.100 cm/s LVOT VTI:    0.160 m  AORTA Ao Root diam: 2.30 cm Ao Asc diam:  2.90 cm MITRAL VALVE MV Area (PHT): 3.89 cm    SHUNTS MV Decel Time: 195 msec    Systemic VTI:  0.16 m MV E velocity: 73.10 cm/s  Systemic Diam: 2.00 cm MV A velocity: 59.10 cm/s MV E/A ratio:  1.24 Photographer signed by Carolan Clines Signature Date/Time: 08/03/2022/11:43:29 AM    Final     Assessment and Plan:  Pregnancy: G2P1001 at [redacted]w[redacted]d 1. Iron deficiency anemia of pregnancy Sample of Accrufer given to her to try, Rx also sent in. She can fill it if no  concerning side effects.  If unable to tolerate, may have to consider IV iron infusion. - Ferric Maltol 30 MG CAPS; Take 1 capsule (30 mg total) by mouth 2 (two) times daily. Please take one hour before breakfast and dinner  Dispense: 60 capsule; Refill: 2  2. Previous cesarean delivery, antepartum Desires RCS.  3. Maternal morbid obesity, antepartum (HCC) 4. H/O macrosomia in infant in prior pregnancy, currently pregnant TWG 12 lbs. Continue growth scans as per MFM and antenatal testing later this pregnancy.  5. Vaginal discharge during pregnancy in second trimester - Cervicovaginal ancillary only( [place]) done, will follow up results and manage accordingly.  Patient reassured by negative evaluation for ruptured membranes.  6 [redacted] weeks gestation of pregnancy 7. Supervision of high risk pregnancy in second trimester Follow up scans as per MFM. Answered concerns about upcoming GTT test at 28 weeks, she wants to avoid any colored drinks, she was told the solution was clear.  She is not enthusiastic about the GTT, is considering refusing this test.   Preterm labor symptoms and general obstetric precautions including but not limited to vaginal bleeding, contractions, leaking of fluid and fetal movement were reviewed in detail with the patient. Please refer to After Visit Summary for other counseling recommendations.   Return in about 4 weeks (around 09/27/2022) for OFFICE OB VISIT (MD only).  Future Appointments  Date Time Provider Department Center  09/12/2022  1:30 PM [facility] US2 [facility]  09/28/2022  8:15 AM [facility] LAB [facility]  09/28/2022  8:35 AM Reva Bores,  MD [facility]  10/10/2022  4:10 PM Girtie Wiersma, Jethro Bastos, MD [facility]  10/28/2022  9:40 AM Tobb, Lavona Mound, DO [facility] None    Jaynie Collins, MD

## 2022-08-30 NOTE — Progress Notes (Deleted)
ROB   CC:   

## 2022-09-01 ENCOUNTER — Encounter: Payer: Self-pay | Admitting: Obstetrics & Gynecology

## 2022-09-02 ENCOUNTER — Encounter: Payer: Self-pay | Admitting: Family Medicine

## 2022-09-12 ENCOUNTER — Ambulatory Visit: Payer: Medicaid Other | Attending: Maternal & Fetal Medicine

## 2022-09-12 ENCOUNTER — Other Ambulatory Visit: Payer: Self-pay | Admitting: *Deleted

## 2022-09-12 DIAGNOSIS — O09292 Supervision of pregnancy with other poor reproductive or obstetric history, second trimester: Secondary | ICD-10-CM | POA: Diagnosis not present

## 2022-09-12 DIAGNOSIS — Z3689 Encounter for other specified antenatal screening: Secondary | ICD-10-CM

## 2022-09-12 DIAGNOSIS — Z3A24 24 weeks gestation of pregnancy: Secondary | ICD-10-CM

## 2022-09-12 DIAGNOSIS — O09293 Supervision of pregnancy with other poor reproductive or obstetric history, third trimester: Secondary | ICD-10-CM

## 2022-09-12 DIAGNOSIS — O09522 Supervision of elderly multigravida, second trimester: Secondary | ICD-10-CM | POA: Insufficient documentation

## 2022-09-12 DIAGNOSIS — E669 Obesity, unspecified: Secondary | ICD-10-CM

## 2022-09-12 DIAGNOSIS — O09523 Supervision of elderly multigravida, third trimester: Secondary | ICD-10-CM

## 2022-09-12 DIAGNOSIS — O99210 Obesity complicating pregnancy, unspecified trimester: Secondary | ICD-10-CM | POA: Insufficient documentation

## 2022-09-12 DIAGNOSIS — O99212 Obesity complicating pregnancy, second trimester: Secondary | ICD-10-CM | POA: Diagnosis not present

## 2022-09-12 DIAGNOSIS — O3662X0 Maternal care for excessive fetal growth, second trimester, not applicable or unspecified: Secondary | ICD-10-CM | POA: Diagnosis not present

## 2022-09-12 DIAGNOSIS — O34219 Maternal care for unspecified type scar from previous cesarean delivery: Secondary | ICD-10-CM | POA: Diagnosis not present

## 2022-09-22 ENCOUNTER — Encounter: Payer: Self-pay | Admitting: Family Medicine

## 2022-09-28 ENCOUNTER — Other Ambulatory Visit: Payer: Medicaid Other

## 2022-09-28 ENCOUNTER — Encounter: Payer: Medicaid Other | Admitting: Family Medicine

## 2022-09-29 ENCOUNTER — Ambulatory Visit (INDEPENDENT_AMBULATORY_CARE_PROVIDER_SITE_OTHER): Payer: Medicaid Other | Admitting: Family Medicine

## 2022-09-29 ENCOUNTER — Other Ambulatory Visit: Payer: Self-pay

## 2022-09-29 VITALS — BP 118/79 | HR 105 | Wt 247.4 lb

## 2022-09-29 DIAGNOSIS — O0993 Supervision of high risk pregnancy, unspecified, third trimester: Secondary | ICD-10-CM

## 2022-09-29 DIAGNOSIS — O34219 Maternal care for unspecified type scar from previous cesarean delivery: Secondary | ICD-10-CM

## 2022-09-29 DIAGNOSIS — Z3A26 26 weeks gestation of pregnancy: Secondary | ICD-10-CM

## 2022-09-29 DIAGNOSIS — O09522 Supervision of elderly multigravida, second trimester: Secondary | ICD-10-CM

## 2022-09-29 NOTE — Progress Notes (Signed)
   PRENATAL VISIT NOTE  Subjective:  Jessica Barron is a 42 y.o. G2P1001 at [redacted]w[redacted]d being seen today for ongoing prenatal care.  She is currently monitored for the following issues for this low-risk pregnancy and has Supervision of high-risk pregnancy; Previous cesarean delivery, antepartum; Maternal morbid obesity, antepartum (HCC); Iron deficiency anemia of pregnancy; Advanced maternal age in multigravida; History of postpartum severe preeclampsia; and H/O macrosomia in infant in prior pregnancy, currently pregnant on their problem list.  Patient reports  RUQ pain, radiates to back and is tender to touch. Denies F/C/N/V/vaginal bleeding. Is improving slightly.  Contractions: Not present. Vag. Bleeding: None.  Movement: Present. Denies leaking of fluid.   The following portions of the patient's history were reviewed and updated as appropriate: allergies, current medications, past family history, past medical history, past social history, past surgical history and problem list.   Objective:   Vitals:   09/29/22 1149 09/29/22 1230  BP: (!) 143/91 118/79  Pulse: 94 (!) 105  Weight: 247 lb 6.4 oz (112.2 kg)     Fetal Status: Fetal Heart Rate (bpm): 150   Movement: Present     General:  Alert, oriented and cooperative. Patient is in no acute distress.  Skin: Skin is warm and dry. No rash noted.   Cardiovascular: Normal heart rate noted  Respiratory: Normal respiratory effort, no problems with respiration noted  Abdomen: Soft, gravid, appropriate for gestational age. Non-tender, no rebound, no guarding  Pain/Pressure: Absent     Pelvic: Cervical exam deferred        Extremities: Normal range of motion.  Edema: Trace  Mental Status: Normal mood and affect. Normal behavior. Normal judgment and thought content.   Assessment and Plan:  Pregnancy: G2P1001 at [redacted]w[redacted]d 1. Supervision of high risk pregnancy in third trimester 28 week labs, declines 2 hour Taking CBGs at home, to send log,  reports FBG 93-95 2 hour 106,119,120 TDaP next vsiit. - HIV Antibody (routine testing w rflx) - Anemia Profile B - RPR  2. Multigravida of advanced maternal age in second trimester Has f/u u/s for growth with MFM, last @ 95%  3. Previous cesarean delivery, antepartum Desires RCS - will book for 39 weeks next visit  4. [redacted] weeks gestation of pregnancy   Preterm labor symptoms and general obstetric precautions including but not limited to vaginal bleeding, contractions, leaking of fluid and fetal movement were reviewed in detail with the patient. Please refer to After Visit Summary for other counseling recommendations.   Return in 2 weeks (on 10/13/2022) for [facility], 28 wk labs - please put with me!.  Future Appointments  Date Time Provider Department Center  10/11/2022  1:30 PM [facility] US1 [facility]  10/28/2022  9:40 AM Tobb, Lavona Mound, DO [facility] None  11/07/2022  1:30 PM [facility] US1 [facility]    Reva Bores, MD

## 2022-09-30 ENCOUNTER — Encounter: Payer: Self-pay | Admitting: Family Medicine

## 2022-09-30 LAB — ANEMIA PROFILE B
Basophils Absolute: 0 10*3/uL (ref 0.0–0.2)
Basos: 0 %
EOS (ABSOLUTE): 0 10*3/uL (ref 0.0–0.4)
Eos: 1 %
Ferritin: 12 ng/mL — ABNORMAL LOW (ref 15–150)
Folate: >20 ng/mL (ref >3.0)
Hematocrit: 34 % (ref 34.0–46.6)
Hemoglobin: 11.1 g/dL (ref 11.1–15.9)
Immature Grans (Abs): 0 10*3/uL (ref 0.0–0.1)
Immature Granulocytes: 1 %
Iron Saturation: 15 % (ref 15–55)
Iron: 87 ug/dL (ref 27–159)
Lymphocytes Absolute: 1.2 10*3/uL (ref 0.7–3.1)
Lymphs: 14 %
MCH: 28.7 pg (ref 26.6–33.0)
MCHC: 32.6 g/dL (ref 31.5–35.7)
MCV: 88 fL (ref 79–97)
Monocytes Absolute: 0.5 10*3/uL (ref 0.1–0.9)
Monocytes: 6 %
Neutrophils Absolute: 6.8 10*3/uL (ref 1.4–7.0)
Neutrophils: 78 %
Platelets: 322 10*3/uL (ref 150–450)
RBC: 3.87 x10E6/uL (ref 3.77–5.28)
RDW: 15.5 % — ABNORMAL HIGH (ref 11.7–15.4)
Retic Ct Pct: 2.8 % — ABNORMAL HIGH (ref 0.6–2.6)
Total Iron Binding Capacity: 569 ug/dL (ref 250–450)
UIBC: 482 ug/dL — ABNORMAL HIGH (ref 131–425)
Vitamin B-12: 200 pg/mL — ABNORMAL LOW (ref 232–1245)
WBC: 8.6 10*3/uL (ref 3.4–10.8)

## 2022-09-30 LAB — HIV ANTIBODY (ROUTINE TESTING W REFLEX): HIV Screen 4th Generation wRfx: NONREACTIVE

## 2022-09-30 LAB — RPR: RPR Ser Ql: NONREACTIVE

## 2022-10-03 ENCOUNTER — Encounter: Payer: Self-pay | Admitting: Family Medicine

## 2022-10-03 MED ORDER — ACCU-CHEK GUIDE W/DEVICE KIT
1.0000 | PACK | Freq: Once | 0 refills | Status: DC
Start: 2022-10-03 — End: 2022-10-28

## 2022-10-03 MED ORDER — ACCU-CHEK SOFTCLIX LANCETS MISC
11 refills | Status: DC
Start: 2022-10-03 — End: 2022-11-26

## 2022-10-03 MED ORDER — ACCU-CHEK GUIDE VI STRP
ORAL_STRIP | 11 refills | Status: DC
Start: 2022-10-03 — End: 2022-11-26

## 2022-10-03 NOTE — Addendum Note (Signed)
Addended by: Maxwell Marion E on: 10/03/2022 10:22 AM   Modules accepted: Orders

## 2022-10-05 ENCOUNTER — Encounter: Payer: Self-pay | Admitting: Family Medicine

## 2022-10-10 ENCOUNTER — Encounter: Payer: Medicaid Other | Admitting: Obstetrics & Gynecology

## 2022-10-11 ENCOUNTER — Ambulatory Visit: Payer: Medicaid Other | Attending: Obstetrics and Gynecology

## 2022-10-11 DIAGNOSIS — O09523 Supervision of elderly multigravida, third trimester: Secondary | ICD-10-CM | POA: Insufficient documentation

## 2022-10-11 DIAGNOSIS — O3663X0 Maternal care for excessive fetal growth, third trimester, not applicable or unspecified: Secondary | ICD-10-CM

## 2022-10-11 DIAGNOSIS — O09293 Supervision of pregnancy with other poor reproductive or obstetric history, third trimester: Secondary | ICD-10-CM | POA: Diagnosis not present

## 2022-10-11 DIAGNOSIS — O34219 Maternal care for unspecified type scar from previous cesarean delivery: Secondary | ICD-10-CM | POA: Diagnosis not present

## 2022-10-11 DIAGNOSIS — Z3A28 28 weeks gestation of pregnancy: Secondary | ICD-10-CM

## 2022-10-11 DIAGNOSIS — E669 Obesity, unspecified: Secondary | ICD-10-CM | POA: Diagnosis not present

## 2022-10-11 DIAGNOSIS — O99213 Obesity complicating pregnancy, third trimester: Secondary | ICD-10-CM | POA: Diagnosis not present

## 2022-10-11 DIAGNOSIS — Z3689 Encounter for other specified antenatal screening: Secondary | ICD-10-CM | POA: Diagnosis not present

## 2022-10-13 ENCOUNTER — Encounter: Payer: Self-pay | Admitting: Family Medicine

## 2022-10-13 ENCOUNTER — Encounter: Payer: Medicaid Other | Admitting: Family Medicine

## 2022-10-13 NOTE — Progress Notes (Signed)
Patient did not keep appointment today. She will be called to reschedule.  

## 2022-10-14 ENCOUNTER — Encounter: Payer: Self-pay | Admitting: Family Medicine

## 2022-10-26 ENCOUNTER — Ambulatory Visit (INDEPENDENT_AMBULATORY_CARE_PROVIDER_SITE_OTHER): Payer: Medicaid Other | Admitting: Obstetrics and Gynecology

## 2022-10-26 ENCOUNTER — Encounter: Payer: Medicaid Other | Admitting: Obstetrics and Gynecology

## 2022-10-26 VITALS — BP 129/78 | HR 91 | Wt 249.9 lb

## 2022-10-26 DIAGNOSIS — Z23 Encounter for immunization: Secondary | ICD-10-CM | POA: Diagnosis not present

## 2022-10-26 DIAGNOSIS — O0993 Supervision of high risk pregnancy, unspecified, third trimester: Secondary | ICD-10-CM

## 2022-10-26 DIAGNOSIS — O99213 Obesity complicating pregnancy, third trimester: Secondary | ICD-10-CM

## 2022-10-26 DIAGNOSIS — O09293 Supervision of pregnancy with other poor reproductive or obstetric history, third trimester: Secondary | ICD-10-CM

## 2022-10-26 DIAGNOSIS — Z3A30 30 weeks gestation of pregnancy: Secondary | ICD-10-CM

## 2022-10-26 DIAGNOSIS — O09299 Supervision of pregnancy with other poor reproductive or obstetric history, unspecified trimester: Secondary | ICD-10-CM

## 2022-10-26 DIAGNOSIS — O34219 Maternal care for unspecified type scar from previous cesarean delivery: Secondary | ICD-10-CM

## 2022-10-26 DIAGNOSIS — O09523 Supervision of elderly multigravida, third trimester: Secondary | ICD-10-CM

## 2022-10-26 NOTE — Progress Notes (Signed)
   PRENATAL VISIT NOTE  Subjective:  Jessica Barron is a 42 y.o. G2P1001 at [redacted]w[redacted]d being seen today for ongoing prenatal care.  She is currently monitored for the following issues for this high-risk pregnancy and has Supervision of high-risk pregnancy; Previous cesarean delivery, antepartum; Maternal morbid obesity, antepartum (HCC); Iron deficiency anemia of pregnancy; Advanced maternal age in multigravida; History of postpartum severe preeclampsia; and H/O macrosomia in infant in prior pregnancy, currently pregnant on their problem list.  Patient doing well with no acute concerns today. She reports  shortness of breath intermittently .  Contractions: Irritability. Vag. Bleeding: None.  Movement: Present. Denies leaking of fluid.   The following portions of the patient's history were reviewed and updated as appropriate: allergies, current medications, past family history, past medical history, past social history, past surgical history and problem list. Problem list updated.  Objective:   Vitals:   10/26/22 1542  BP: 129/78  Pulse: 91  Weight: 249 lb 14.4 oz (113.4 kg)    Fetal Status: Fetal Heart Rate (bpm): 136 Fundal Height: 34 cm Movement: Present     General:  Alert, oriented and cooperative. Patient is in no acute distress.  Skin: Skin is warm and dry. No rash noted.   Cardiovascular: Normal heart rate noted  Respiratory: Normal respiratory effort, no problems with respiration noted, no rhochi, rales or abnormal breath sounds noted  Abdomen: Soft, gravid, appropriate for gestational age.  Pain/Pressure: Present     Pelvic: Cervical exam deferred        Extremities: Normal range of motion.  Edema: Trace  Mental Status:  Normal mood and affect. Normal behavior. Normal judgment and thought content.   Assessment and Plan:  Pregnancy: G2P1001 at [redacted]w[redacted]d  1. [redacted] weeks gestation of pregnancy   2. Multigravida of advanced maternal age in third trimester   3. H/O macrosomia in  infant in prior pregnancy, currently pregnant Fundal height feels 3-4 weeks ahead of expected, last EFW> 99% , pt has repeat growth scan 11/07/22.  Pt has not performed 2 hour GTT, but states she is checking blood sugars 4 times/day, will monitor  4. History of postpartum severe preeclampsia Current blood pressure WNL  5. Maternal morbid obesity, antepartum (HCC)   6. Previous cesarean delivery, antepartum Pt desires repeat c section, sign consent and schedule at next visit  7. Supervision of high risk pregnancy in third trimester Continue routine prenatal care - Tdap vaccine greater than or equal to 7yo IM  Preterm labor symptoms and general obstetric precautions including but not limited to vaginal bleeding, contractions, leaking of fluid and fetal movement were reviewed in detail with the patient.  Please refer to After Visit Summary for other counseling recommendations.   Return in about 2 weeks (around 11/09/2022) for [facility], in person.   Mariel Aloe, MD Faculty Attending Center for [facility]

## 2022-10-26 NOTE — Progress Notes (Signed)
Pt states rather do Self stick instead of doing Glucose Drink.

## 2022-10-28 ENCOUNTER — Ambulatory Visit: Payer: Medicaid Other | Attending: Cardiology | Admitting: Cardiology

## 2022-10-28 ENCOUNTER — Other Ambulatory Visit: Payer: Self-pay | Admitting: Family Medicine

## 2022-10-28 VITALS — BP 138/80 | HR 97 | Ht 62.0 in | Wt 251.2 lb

## 2022-10-28 DIAGNOSIS — E782 Mixed hyperlipidemia: Secondary | ICD-10-CM | POA: Diagnosis not present

## 2022-10-28 DIAGNOSIS — O0993 Supervision of high risk pregnancy, unspecified, third trimester: Secondary | ICD-10-CM

## 2022-10-28 DIAGNOSIS — O24410 Gestational diabetes mellitus in pregnancy, diet controlled: Secondary | ICD-10-CM

## 2022-10-28 DIAGNOSIS — O133 Gestational [pregnancy-induced] hypertension without significant proteinuria, third trimester: Secondary | ICD-10-CM

## 2022-10-28 DIAGNOSIS — Z3A31 31 weeks gestation of pregnancy: Secondary | ICD-10-CM | POA: Diagnosis not present

## 2022-10-28 NOTE — Patient Instructions (Addendum)
Medication Instructions:  Your physician recommends that you continue on your current medications as directed. Please refer to the Current Medication list given to you today.  *If you need a refill on your cardiac medications before your next appointment, please call your pharmacy*     Follow-Up: At [facility], you and your health needs are our priority.  As part of our continuing mission to provide you with exceptional heart care, we have created designated Provider Care Teams.  These Care Teams include your primary Cardiologist (physician) and Advanced Practice Providers (APPs -  Physician Assistants and Nurse Practitioners) who all work together to provide you with the care you need, when you need it.  We recommend signing up for the patient portal called "MyChart".  Sign up information is provided on this After Visit Summary.  MyChart is used to connect with patients for Virtual Visits (Telemedicine).  Patients are able to view lab/test results, encounter notes, upcoming appointments, etc.  Non-urgent messages can be sent to your provider as well.   To learn more about what you can do with MyChart, go to [email or web address].    Your next appointment:   16 week(s)  Provider:   Dr. Servando Salina

## 2022-10-31 ENCOUNTER — Encounter: Payer: Medicaid Other | Admitting: Family Medicine

## 2022-11-01 NOTE — Progress Notes (Signed)
Cardio-Obstetrics Clinic  Follow Up Note   Date:  11/01/2022   ID:  Jessica Barron, DOB 02/18/1980, MRN [id number]  PCP:  Joaquim Nam, MD   [facility] Providers Cardiologist:  Thomasene Ripple, DO  Electrophysiologist:  None        Referring MD: Joaquim Nam, MD   Chief Complaint: " I am doing well  History of Present Illness:    Jessica Barron is a 42 y.o. female [G2P1001] who returns for follow up of cardiovascular care in pregnancy.   The patient also reports shortness of breath, which she attributes to the physical changes of pregnancy, including reduced lung capacity due to the growing baby. She has been experiencing acid reflux since the start of her second trimester, a symptom she also experienced during her previous pregnancy. The patient is also dealing with pelvic girdle pain, which is causing significant discomfort, particularly when changing positions in bed.  No chest pain.   Prior CV Studies Reviewed: The following studies were reviewed today: TTE 20-Aug-2022 IMPRESSIONS     1. Left ventricular ejection fraction, by estimation, is 60 to 65%. The  left ventricle has normal function. The left ventricle has no regional  wall motion abnormalities. Left ventricular diastolic parameters were  normal.   2. Right ventricular systolic function is normal. The right ventricular  size is normal.   3. The mitral valve is normal in structure. No evidence of mitral valve  regurgitation.   4. The aortic valve is grossly normal. Aortic valve regurgitation is not  visualized.   5. The inferior vena cava is normal in size with <50% respiratory  variability, suggesting right atrial pressure of 8 mmHg.   FINDINGS   Left Ventricle: Left ventricular ejection fraction, by estimation, is 60  to 65%. The left ventricle has normal function. The left ventricle has no  regional wall motion abnormalities. The left ventricular internal cavity  size was normal in  size. There is   no left ventricular hypertrophy. Left ventricular diastolic parameters  were normal.   Right Ventricle: The right ventricular size is normal. Right ventricular  systolic function is normal.   Left Atrium: Left atrial size was normal in size.   Right Atrium: Right atrial size was normal in size.   Pericardium: There is no evidence of pericardial effusion.   Mitral Valve: The mitral valve is normal in structure. No evidence of  mitral valve regurgitation.   Tricuspid Valve: Tricuspid valve regurgitation is not demonstrated.   Aortic Valve: The aortic valve is grossly normal. Aortic valve  regurgitation is not visualized.   Pulmonic Valve: The pulmonic valve was not well visualized. Pulmonic valve  regurgitation is not visualized.   Aorta: The aortic root and ascending aorta are structurally normal, with  no evidence of dilitation.   Venous: The inferior vena cava is normal in size with less than 50%  respiratory variability, suggesting right atrial pressure of 8 mmHg.   IAS/Shunts: No atrial level shunt detected by color flow Doppler.       Past Medical History:  Diagnosis Date   Abnormal Pap smear    repeat WNL   Constipation 02/18/2018   Depression    FH: bladder cancer 02/18/2018   GERD (gastroesophageal reflux disease)    Hip pain 01/03/2021   History of PCOS    Hyperlipidemia 02/11/2016   Menorrhagia with regular cycle 01/26/2022   PCOS (polycystic ovarian syndrome) 09/11/2013   Perirectal abscess 07/19/2022   Raynaud's phenomenon 01/03/2021  Rosacea 01/01/2021   Supervision of high risk pregnancy, antepartum 05/18/2022          NURSING   PROVIDER  Office Location  [facility]  Dating by  LMP c/w U/S at 7.3 wks  [facility] Model  Traditional  Anatomy U/S     Initiated care at   [employer]   English                 LAB RESULTS   Support Person  FOB-[facility]ryan  Genetics  NIPS: LR M            AFP:    wnl                NT/IT (FT only)            Carrier Screen  Horizon: neg x 4  Rhogam   O/Positive/-- (05/28    Past Surgical History:  Procedure Laterality Date   CESAREAN SECTION  02/25/2011   Procedure: CESAREAN SECTION;  Surgeon: Leslie Andrea, MD;  Location: [facility];  Service: Gynecology;  Laterality: N/A;   CHOLECYSTECTOMY        OB History     Gravida  2   Para  1   Term  1   Preterm  0   AB  0   Living  1      SAB  0   IAB  0   Ectopic  0   Multiple  0   Live Births  1               Current Medications: No outpatient medications have been marked as taking for the 10/28/22 encounter (Office Visit) with Thomasene Ripple, DO.     Allergies:   Lexapro [escitalopram oxalate] and Wellbutrin [bupropion]   Social History   Socioeconomic History   Marital status: Significant Other    Spouse name: Not on file   Number of children: Not on file   Years of education: Not on file   Highest education level: Not on file  Occupational History   Not on file  Tobacco Use   Smoking status: Never    Passive exposure: Past   Smokeless tobacco: Never  Vaping Use   Vaping status: Never Used  Substance and Sexual Activity   Alcohol use: Yes    Comment: rare   Drug use: No   Sexual activity: Yes    Birth control/protection: None  Other Topics Concern   Not on file  Social History Narrative   Married 2010- separated from husband 2020.     Social Determinants of Health   Financial Resource Strain: Not on file  Food Insecurity: Not on file  Transportation Needs: Not on file  Physical Activity: Not on file  Stress: Not on file  Social Connections: Not on file      Family History  Problem Relation Age of Onset   Bladder Cancer Mother    COPD Mother    Cancer Mother        bladder   Bladder Cancer Father    Cancer Father        bladder   Breast cancer Paternal Grandmother    Anesthesia problems Neg Hx    Colon cancer Neg Hx       ROS:   Please see the history of present illness.     Shortness  of breath All other systems reviewed and are negative.   Labs/EKG Reviewed:    EKG:   EKG was ordered today.    Recent Labs: 06/21/2022: ALT 25; BUN 9; Creatinine, Ser 0.61; Potassium 4.3; Sodium 138; TSH 1.200 09/29/2022: Hemoglobin 11.1; Platelets 322   Recent Lipid Panel Lab Results  Component Value Date/Time   CHOL 207 (H) 05/11/2022 02:39 PM   TRIG 110.0 05/11/2022 02:39 PM   HDL 54.80 05/11/2022 02:39 PM   CHOLHDL 4 05/11/2022 02:39 PM   LDLCALC 130 (H) 05/11/2022 02:39 PM   LDLCALC 140 (H) 02/16/2018 03:22 PM    Physical Exam:    VS:  BP 138/80   Pulse 97   Ht 5\' 2"  (1.575 m)   Wt 251 lb 3.2 oz (113.9 kg)   LMP 03/25/2022   SpO2 98%   BMI 45.95 kg/m     Wt Readings from Last 3 Encounters:  10/28/22 251 lb 3.2 oz (113.9 kg)  10/26/22 249 lb 14.4 oz (113.4 kg)  09/29/22 247 lb 6.4 oz (112.2 kg)     GEN:  Well nourished, well developed in no acute distress HEENT: Normal NECK: No JVD; No carotid bruits LYMPHATICS: No lymphadenopathy CARDIAC: RRR, no murmurs, rubs, gallops RESPIRATORY:  Clear to auscultation without rales, wheezing or rhonchi  ABDOMEN: Soft, non-tender, non-distended MUSCULOSKELETAL:  No edema; No deformity  SKIN: Warm and dry NEUROLOGIC:  Alert and oriented x 3 PSYCHIATRIC:  Normal affect    Risk Assessment/Risk Calculators:     CARPREG II Risk Prediction Index Score:  1.  The patient's risk for a primary cardiac event is 5%.            ASSESSMENT & PLAN:    Gestational Diabetes Recent increase in fasting blood glucose levels, currently borderline. Patient is monitoring diet to manage blood glucose levels. Family history of poor tolerance to Metformin. -Continue monitoring blood glucose levels and managing with diet.  Pregnancy [redacted] weeks gestation with large for gestational age fetus. Patient experiencing shortness of breath, acid reflux, and pelvic girdle pain due to pregnancy. -Continue prenatal care and  monitoring.  Hypertension Blood pressure readings at home have been within normal range. -Continue monitoring blood pressure at home daily postpartum. Notify doctor if readings are consistently 140/90 or higher.  Hypercholesterolemia Elevated cholesterol levels noted on labs from April 2024. Family history of hypercholesterolemia. -Plan to recheck cholesterol levels 6 months postpartum.  Cardiac Monitoring Patient has a [employer] monitor at home but the box was discarded. -Contact [employer] to request a new box for the monitor.  General Health Maintenance -Continue healthy diet and lifestyle habits. -Follow-up appointment in 15 weeks.  Patient Instructions  Medication Instructions:  Your physician recommends that you continue on your current medications as directed. Please refer to the Current Medication list given to you today.  *If you need a refill on your cardiac medications before your next appointment, please call your pharmacy*     Follow-Up: At [facility], you and your health needs are our priority.  As part of our continuing mission to provide you with exceptional heart care, we have created designated Provider Care Teams.  These Care Teams include your primary Cardiologist (physician) and Advanced Practice Providers (APPs -  Physician Assistants and Nurse Practitioners) who all work together to provide you with the care you need, when you need it.  We recommend signing up for the patient portal called "MyChart".  Sign up information is provided on this After Visit Summary.  MyChart is used to connect with patients for Virtual Visits (Telemedicine).  Patients are able to view lab/test results, encounter notes, upcoming appointments, etc.  Non-urgent messages can be sent to your provider as well.   To learn more about what you can do with MyChart, go to [email or web address].    Your next appointment:   16 week(s)  Provider:   Dr. Servando Salina   Dispo:  No follow-ups on file.    Medication Adjustments/Labs and Tests Ordered: Current medicines are reviewed at length with the patient today.  Concerns regarding medicines are outlined above.  Tests Ordered: No orders of the defined types were placed in this encounter.  Medication Changes: No orders of the defined types were placed in this encounter.

## 2022-11-07 ENCOUNTER — Ambulatory Visit (INDEPENDENT_AMBULATORY_CARE_PROVIDER_SITE_OTHER): Payer: Medicaid Other | Admitting: Obstetrics and Gynecology

## 2022-11-07 ENCOUNTER — Other Ambulatory Visit: Payer: Self-pay | Admitting: Obstetrics and Gynecology

## 2022-11-07 ENCOUNTER — Ambulatory Visit: Payer: Medicaid Other | Attending: Obstetrics and Gynecology

## 2022-11-07 VITALS — BP 136/82 | HR 83 | Wt 252.0 lb

## 2022-11-07 DIAGNOSIS — O99213 Obesity complicating pregnancy, third trimester: Secondary | ICD-10-CM

## 2022-11-07 DIAGNOSIS — Z3689 Encounter for other specified antenatal screening: Secondary | ICD-10-CM | POA: Insufficient documentation

## 2022-11-07 DIAGNOSIS — O09299 Supervision of pregnancy with other poor reproductive or obstetric history, unspecified trimester: Secondary | ICD-10-CM

## 2022-11-07 DIAGNOSIS — O09293 Supervision of pregnancy with other poor reproductive or obstetric history, third trimester: Secondary | ICD-10-CM | POA: Insufficient documentation

## 2022-11-07 DIAGNOSIS — O24419 Gestational diabetes mellitus in pregnancy, unspecified control: Secondary | ICD-10-CM | POA: Insufficient documentation

## 2022-11-07 DIAGNOSIS — O09523 Supervision of elderly multigravida, third trimester: Secondary | ICD-10-CM | POA: Diagnosis not present

## 2022-11-07 DIAGNOSIS — O0993 Supervision of high risk pregnancy, unspecified, third trimester: Secondary | ICD-10-CM

## 2022-11-07 DIAGNOSIS — O34219 Maternal care for unspecified type scar from previous cesarean delivery: Secondary | ICD-10-CM

## 2022-11-07 DIAGNOSIS — Z3A32 32 weeks gestation of pregnancy: Secondary | ICD-10-CM | POA: Diagnosis not present

## 2022-11-07 DIAGNOSIS — E669 Obesity, unspecified: Secondary | ICD-10-CM

## 2022-11-07 DIAGNOSIS — O3663X0 Maternal care for excessive fetal growth, third trimester, not applicable or unspecified: Secondary | ICD-10-CM | POA: Diagnosis not present

## 2022-11-07 DIAGNOSIS — O24415 Gestational diabetes mellitus in pregnancy, controlled by oral hypoglycemic drugs: Secondary | ICD-10-CM | POA: Diagnosis not present

## 2022-11-07 DIAGNOSIS — Z8632 Personal history of gestational diabetes: Secondary | ICD-10-CM | POA: Insufficient documentation

## 2022-11-07 MED ORDER — METFORMIN HCL 500 MG PO TABS
500.0000 mg | ORAL_TABLET | Freq: Every day | ORAL | 6 refills | Status: DC
Start: 2022-11-07 — End: 2022-11-26

## 2022-11-07 NOTE — Progress Notes (Signed)
   PRENATAL VISIT NOTE  Subjective:  Jessica Barron is a 42 y.o. G2P1001 at [redacted]w[redacted]d being seen today for ongoing prenatal care.  She is currently monitored for the following issues for this high-risk pregnancy and has Supervision of high-risk pregnancy; Previous cesarean delivery, antepartum; Maternal morbid obesity, antepartum (HCC); Iron deficiency anemia of pregnancy; Advanced maternal age in multigravida; History of postpartum severe preeclampsia; H/O macrosomia in infant in prior pregnancy, currently pregnant; and Gestational diabetes mellitus (GDM) controlled on oral hypoglycemic drug on their problem list.  Patient doing well with no acute concerns today. She reports  lower abdominal pain c/w round ligament pain .  Contractions: Irritability. Vag. Bleeding: None.  Movement: Present. Denies leaking of fluid.   The following portions of the patient's history were reviewed and updated as appropriate: allergies, current medications, past family history, past medical history, past social history, past surgical history and problem list. Problem list updated.  Objective:   Vitals:   11/07/22 1557  BP: 136/82  Pulse: 83  Weight: 252 lb (114.3 kg)    Fetal Status: Fetal Heart Rate (bpm): 130 Fundal Height: 38 cm Movement: Present     General:  Alert, oriented and cooperative. Patient is in no acute distress.  Skin: Skin is warm and dry. No rash noted.   Cardiovascular: Normal heart rate noted  Respiratory: Normal respiratory effort, no problems with respiration noted  Abdomen: Soft, gravid, appropriate for gestational age.  Pain/Pressure: Present     Pelvic: Cervical exam deferred        Extremities: Normal range of motion.  Edema: Trace  Mental Status:  Normal mood and affect. Normal behavior. Normal judgment and thought content.   Assessment and Plan:  Pregnancy: G2P1001 at [redacted]w[redacted]d  1. [redacted] weeks gestation of pregnancy   2. Multigravida of advanced maternal age in third  trimester   3. H/O macrosomia in infant in prior pregnancy, currently pregnant Current fetus appears macrosomic with fundal exam  4. History of postpartum severe preeclampsia No s/sx of preeclampsia  5. Maternal morbid obesity, antepartum (HCC)   6. Previous cesarean delivery, antepartum Pt desires repeat c section possible BTL Both consents signed today  7. Supervision of high risk pregnancy in third trimester Continue routine prenatal care  8. Gestational diabetes mellitus (GDM) in third trimester controlled on oral hypoglycemic drug FBS have been consistently elevated per pt.  MFM recommends adding insulin or metformin to address the blood sugars.  Pt desires trial of metformin  q hs before insulin - metFORMIN (GLUCOPHAGE) 500 MG tablet; Take 1 tablet (500 mg total) by mouth at bedtime.  Dispense: 30 tablet; Refill: 6  Preterm labor symptoms and general obstetric precautions including but not limited to vaginal bleeding, contractions, leaking of fluid and fetal movement were reviewed in detail with the patient.  Please refer to After Visit Summary for other counseling recommendations.   Return in about 1 week (around 11/14/2022) for [facility], in person.   Mariel Aloe, MD Faculty Attending Center for [facility]

## 2022-11-07 NOTE — Progress Notes (Signed)
Pt did not bring glucose log

## 2022-11-08 ENCOUNTER — Other Ambulatory Visit: Payer: Self-pay | Admitting: *Deleted

## 2022-11-08 DIAGNOSIS — O3660X0 Maternal care for excessive fetal growth, unspecified trimester, not applicable or unspecified: Secondary | ICD-10-CM

## 2022-11-08 DIAGNOSIS — O99213 Obesity complicating pregnancy, third trimester: Secondary | ICD-10-CM

## 2022-11-08 DIAGNOSIS — O09523 Supervision of elderly multigravida, third trimester: Secondary | ICD-10-CM

## 2022-11-08 DIAGNOSIS — O09299 Supervision of pregnancy with other poor reproductive or obstetric history, unspecified trimester: Secondary | ICD-10-CM

## 2022-11-08 DIAGNOSIS — O34219 Maternal care for unspecified type scar from previous cesarean delivery: Secondary | ICD-10-CM

## 2022-11-09 ENCOUNTER — Encounter: Payer: Self-pay | Admitting: *Deleted

## 2022-11-14 ENCOUNTER — Ambulatory Visit: Payer: Medicaid Other | Attending: Obstetrics and Gynecology | Admitting: *Deleted

## 2022-11-14 ENCOUNTER — Other Ambulatory Visit: Payer: Self-pay

## 2022-11-14 DIAGNOSIS — Z3A38 38 weeks gestation of pregnancy: Secondary | ICD-10-CM | POA: Diagnosis not present

## 2022-11-14 DIAGNOSIS — O24415 Gestational diabetes mellitus in pregnancy, controlled by oral hypoglycemic drugs: Secondary | ICD-10-CM | POA: Insufficient documentation

## 2022-11-14 DIAGNOSIS — Z3A33 33 weeks gestation of pregnancy: Secondary | ICD-10-CM | POA: Insufficient documentation

## 2022-11-14 DIAGNOSIS — O09523 Supervision of elderly multigravida, third trimester: Secondary | ICD-10-CM | POA: Diagnosis not present

## 2022-11-14 NOTE — Procedures (Signed)
Jessica Barron 1980/07/20 [redacted]w[redacted]d  Fetus A Non-Stress Test Interpretation for 11/14/22  Indication: Gestational Diabetes medication controlled, AMA  Fetal Heart Rate A Mode: External Baseline Rate (A): 135 bpm Variability: Moderate Accelerations: 15 x 15 Decelerations: None Multiple birth?: No  Uterine Activity Mode: Palpation, Toco Contraction Frequency (min): 3 uc's Contraction Duration (sec): 60-90 Contraction Quality: Mild Resting Tone Palpated: Relaxed Resting Time: Adequate  Interpretation (Fetal Testing) Nonstress Test Interpretation: Reactive Overall Impression: Reassuring for gestational age Comments: Dr. Judeth Cornfield reviewed tracing

## 2022-11-15 ENCOUNTER — Encounter: Payer: Self-pay | Admitting: Family Medicine

## 2022-11-15 ENCOUNTER — Ambulatory Visit (INDEPENDENT_AMBULATORY_CARE_PROVIDER_SITE_OTHER): Payer: Medicaid Other | Admitting: Family Medicine

## 2022-11-15 VITALS — BP 127/85 | HR 89 | Wt 253.2 lb

## 2022-11-15 DIAGNOSIS — O99210 Obesity complicating pregnancy, unspecified trimester: Secondary | ICD-10-CM

## 2022-11-15 DIAGNOSIS — O09299 Supervision of pregnancy with other poor reproductive or obstetric history, unspecified trimester: Secondary | ICD-10-CM

## 2022-11-15 DIAGNOSIS — O34219 Maternal care for unspecified type scar from previous cesarean delivery: Secondary | ICD-10-CM

## 2022-11-15 DIAGNOSIS — O09523 Supervision of elderly multigravida, third trimester: Secondary | ICD-10-CM

## 2022-11-15 DIAGNOSIS — O99213 Obesity complicating pregnancy, third trimester: Secondary | ICD-10-CM

## 2022-11-15 DIAGNOSIS — O24419 Gestational diabetes mellitus in pregnancy, unspecified control: Secondary | ICD-10-CM

## 2022-11-15 DIAGNOSIS — O99019 Anemia complicating pregnancy, unspecified trimester: Secondary | ICD-10-CM

## 2022-11-15 DIAGNOSIS — O99013 Anemia complicating pregnancy, third trimester: Secondary | ICD-10-CM

## 2022-11-15 DIAGNOSIS — O09293 Supervision of pregnancy with other poor reproductive or obstetric history, third trimester: Secondary | ICD-10-CM

## 2022-11-15 DIAGNOSIS — Z3A33 33 weeks gestation of pregnancy: Secondary | ICD-10-CM

## 2022-11-15 DIAGNOSIS — O0993 Supervision of high risk pregnancy, unspecified, third trimester: Secondary | ICD-10-CM

## 2022-11-15 DIAGNOSIS — D509 Iron deficiency anemia, unspecified: Secondary | ICD-10-CM

## 2022-11-15 MED ORDER — DEXCOM G7 SENSOR MISC: 6 refills | Status: DC

## 2022-11-15 NOTE — Progress Notes (Signed)
   Subjective:  Jessica Barron is a 42 y.o. G2P1001 at 102w4d being seen today for ongoing prenatal care.  She is currently monitored for the following issues for this high-risk pregnancy and has Supervision of high-risk pregnancy; Previous cesarean delivery, antepartum; Maternal morbid obesity, antepartum (HCC); Iron deficiency anemia of pregnancy; Advanced maternal age in multigravida; History of postpartum severe preeclampsia; H/O macrosomia in infant in prior pregnancy, currently pregnant; and Gestational diabetes mellitus (GDM) controlled on oral hypoglycemic drug on their problem list.  Patient reports  pelvic pain .  Contractions: Not present. Vag. Bleeding: None.  Movement: Present. Denies leaking of fluid.   The following portions of the patient's history were reviewed and updated as appropriate: allergies, current medications, past family history, past medical history, past social history, past surgical history and problem list. Problem list updated.  Objective:   Vitals:   11/15/22 1545 11/15/22 1613  BP: (!) 144/88 127/85  Pulse: 97 89  Weight: 253 lb 3.2 oz (114.9 kg)     Fetal Status: Fetal Heart Rate (bpm): 148   Movement: Present     General:  Alert, oriented and cooperative. Patient is in no acute distress.  Skin: Skin is warm and dry. No rash noted.   Cardiovascular: Normal heart rate noted  Respiratory: Normal respiratory effort, no problems with respiration noted  Abdomen: Soft, gravid, appropriate for gestational age. Pain/Pressure: Present     Pelvic: Vag. Bleeding: None     Cervical exam deferred        Extremities: Normal range of motion.  Edema: Trace  Mental Status: Normal mood and affect. Normal behavior. Normal judgment and thought content.   Urinalysis:      Assessment and Plan:  Pregnancy: G2P1001 at [redacted]w[redacted]d  1. Supervision of high risk pregnancy in third trimester BP initially elevated, repeat normal FHR normal  2. Gestational diabetes mellitus (GDM)  in third trimester controlled on oral hypoglycemic drug Log reviewed on her phone, still generally mildly uncontrolled Currently rx metformin 500 at bedtime, but had intolerable GI side effects, stopped taking it On ASA Reports she does not feel that her diagnosis of GDM is accurate, she is getting good values at home, checking a range of fastings/postprandials with generally good control in her opinion Discussed we can get a CGM to better assess her overall trends and she is in agreement with this idea Last growth [place] 11/07/2022, EFW >99%, AC >99%, AFI 16 May need to move cesarean up to 37 weeks pending glucose control and next growth [place]  3. Previous cesarean delivery, antepartum Desires RCS+BTL Scheduled for    4. Maternal morbid obesity, antepartum (HCC)   5. Iron deficiency anemia of pregnancy Lab Results  Component Value Date   HGB 11.1 09/29/2022  resolved  6. History of postpartum severe preeclampsia Initial BP elevated, reports normal at home, repeat was normal On ASA  7. H/O macrosomia in infant in prior pregnancy, currently pregnant 4325g at delivery, 38 wks  8. Multigravida of advanced maternal age in third trimester On ASA  Preterm labor symptoms and general obstetric precautions including but not limited to vaginal bleeding, contractions, leaking of fluid and fetal movement were reviewed in detail with the patient. Please refer to After Visit Summary for other counseling recommendations.  Return in 2 weeks (on 11/29/2022) for [facility], ob visit.   Venora Maples, MD

## 2022-11-15 NOTE — Patient Instructions (Signed)

## 2022-11-16 ENCOUNTER — Telehealth: Payer: Self-pay | Admitting: Lactation Services

## 2022-11-16 NOTE — Telephone Encounter (Signed)
Jessica Barron (Jessica Barron) Rx #: [id number] Need Help? Call us at [phone] Outcome Additional Information Required Prior Authorization Not Required Drug Dexcom G7 Sensor ePA cloud logo Form [employer] [place] Electronic Prior Authorization Form Original Claim Info 75  Called Pharmacy to let then know PA not required. Prescription went through.   Called patient to inform her Rx has been approved. Patient did not answer. Mailbox full and cannot leave a message. Will send Mychart message.

## 2022-11-16 NOTE — Telephone Encounter (Signed)
Dexcom G7 PA sent through Cover My Meds. Awaiting determination.    Drucie Opitz (KeyHulen Skains) Rx #: [id number] Need Help? Call us at [phone] Status Sent to Plan today Drug Dexcom G7 Sensor Form [employer] Medicaid Electronic Prior Authorization Form Original Claim Info 75

## 2022-11-21 ENCOUNTER — Ambulatory Visit: Payer: Medicaid Other

## 2022-11-21 ENCOUNTER — Encounter: Payer: Medicaid Other | Admitting: Obstetrics & Gynecology

## 2022-11-22 ENCOUNTER — Inpatient Hospital Stay (HOSPITAL_COMMUNITY)
Admission: AD | Admit: 2022-11-22 | Discharge: 2022-11-26 | DRG: 785 | Disposition: A | Discharge status: Home / Self Care | Payer: Medicaid Other | Attending: Obstetrics and Gynecology | Admitting: Obstetrics and Gynecology

## 2022-11-22 ENCOUNTER — Encounter (HOSPITAL_COMMUNITY): Payer: Self-pay | Admitting: Obstetrics and Gynecology

## 2022-11-22 DIAGNOSIS — Z8551 Personal history of malignant neoplasm of bladder: Secondary | ICD-10-CM

## 2022-11-22 DIAGNOSIS — O99210 Obesity complicating pregnancy, unspecified trimester: Secondary | ICD-10-CM | POA: Diagnosis not present

## 2022-11-22 DIAGNOSIS — O24415 Gestational diabetes mellitus in pregnancy, controlled by oral hypoglycemic drugs: Secondary | ICD-10-CM | POA: Diagnosis not present

## 2022-11-22 DIAGNOSIS — Z98891 History of uterine scar from previous surgery: Secondary | ICD-10-CM

## 2022-11-22 DIAGNOSIS — O34211 Maternal care for low transverse scar from previous cesarean delivery: Secondary | ICD-10-CM | POA: Diagnosis present

## 2022-11-22 DIAGNOSIS — O42013 Preterm premature rupture of membranes, onset of labor within 24 hours of rupture, third trimester: Principal | ICD-10-CM

## 2022-11-22 DIAGNOSIS — Z3A34 34 weeks gestation of pregnancy: Secondary | ICD-10-CM | POA: Diagnosis not present

## 2022-11-22 DIAGNOSIS — O34219 Maternal care for unspecified type scar from previous cesarean delivery: Secondary | ICD-10-CM | POA: Diagnosis not present

## 2022-11-22 DIAGNOSIS — K219 Gastro-esophageal reflux disease without esophagitis: Secondary | ICD-10-CM | POA: Diagnosis present

## 2022-11-22 DIAGNOSIS — O165 Unspecified maternal hypertension, complicating the puerperium: Secondary | ICD-10-CM | POA: Diagnosis not present

## 2022-11-22 DIAGNOSIS — Z9049 Acquired absence of other specified parts of digestive tract: Secondary | ICD-10-CM

## 2022-11-22 DIAGNOSIS — O24425 Gestational diabetes mellitus in childbirth, controlled by oral hypoglycemic drugs: Secondary | ICD-10-CM | POA: Diagnosis present

## 2022-11-22 DIAGNOSIS — Z3490 Encounter for supervision of normal pregnancy, unspecified, unspecified trimester: Secondary | ICD-10-CM

## 2022-11-22 DIAGNOSIS — O3663X0 Maternal care for excessive fetal growth, third trimester, not applicable or unspecified: Secondary | ICD-10-CM | POA: Diagnosis present

## 2022-11-22 DIAGNOSIS — O99214 Obesity complicating childbirth: Secondary | ICD-10-CM | POA: Diagnosis present

## 2022-11-22 DIAGNOSIS — O9962 Diseases of the digestive system complicating childbirth: Secondary | ICD-10-CM | POA: Diagnosis present

## 2022-11-22 DIAGNOSIS — Z888 Allergy status to other drugs, medicaments and biological substances status: Secondary | ICD-10-CM

## 2022-11-22 DIAGNOSIS — O135 Gestational [pregnancy-induced] hypertension without significant proteinuria, complicating the puerperium: Secondary | ICD-10-CM | POA: Diagnosis not present

## 2022-11-22 DIAGNOSIS — Z803 Family history of malignant neoplasm of breast: Secondary | ICD-10-CM

## 2022-11-22 DIAGNOSIS — Z302 Encounter for sterilization: Secondary | ICD-10-CM

## 2022-11-22 DIAGNOSIS — O321XX0 Maternal care for breech presentation, not applicable or unspecified: Secondary | ICD-10-CM | POA: Diagnosis present

## 2022-11-22 DIAGNOSIS — O42913 Preterm premature rupture of membranes, unspecified as to length of time between rupture and onset of labor, third trimester: Principal | ICD-10-CM | POA: Diagnosis present

## 2022-11-22 DIAGNOSIS — Z8052 Family history of malignant neoplasm of bladder: Secondary | ICD-10-CM

## 2022-11-22 DIAGNOSIS — Z825 Family history of asthma and other chronic lower respiratory diseases: Secondary | ICD-10-CM

## 2022-11-22 LAB — POCT FERN TEST: POCT Fern Test: POSITIVE

## 2022-11-22 MED ORDER — LACTATED RINGERS IV BOLUS
1000.0000 mL | Freq: Once | INTRAVENOUS | Status: AC
  Administered 2022-11-22: 1000 mL via INTRAVENOUS

## 2022-11-22 MED ORDER — LACTATED RINGERS IV SOLN: INTRAVENOUS | Status: DC

## 2022-11-22 NOTE — MAU Note (Addendum)
.  Jessica Barron is a 42 y.o. at [redacted]w[redacted]d here in MAU reporting rupture of membranes at 2115. Fld is clear. Pt called EMS who brought her to MAU. Reports good FM. Baby was breech at last check. No pain. PT has DexCom on L arm  Onset of complaint: 2115 Pain score: 0 Vitals:   11/22/22 2225 11/22/22 2226  BP:  (!) 149/94  Pulse:    Resp: 17   Temp: 98.1 F (36.7 C)   SpO2:       FHT:134 Lab orders placed from triage:  none

## 2022-11-22 NOTE — MAU Provider Note (Signed)
Chief Complaint:  No chief complaint on file.   Event Date/Time   First Provider Initiated Contact with Patient 11/22/22 2217     HPI: Jessica Barron is a 42 y.o. G2P1001 at 17w4dwho presents to maternity admissions reporting leaking water at about 2115.  Has had some mild contractions. Known breech presentation.  Last ate at 1830  Patient is "sort of diabetic"   Has had normal BS until recently when she had some elevated fasting BS levels.  Was planning on a repeat C/S.  Last baby was also breech. . She reports good fetal movement, denies vaginal bleeding, n/v, or fever/chills.    Vaginal Discharge The patient's primary symptoms include pelvic pain (mild) and vaginal discharge. The patient's pertinent negatives include no genital odor or vaginal bleeding. This is a new problem. The current episode started today. The problem occurs constantly. She is pregnant. Pertinent negatives include no chills, fever or frequency. The vaginal discharge was clear, watery and copious. There has been no bleeding. She has not been passing clots. She has not been passing tissue.   Past Medical History: Past Medical History:  Diagnosis Date   Abnormal Pap smear    repeat WNL   Constipation 02/18/2018   Depression    FH: bladder cancer 02/18/2018   GERD (gastroesophageal reflux disease)    Hip pain 01/03/2021   History of PCOS    Hyperlipidemia 02/11/2016   Menorrhagia with regular cycle 01/26/2022   PCOS (polycystic ovarian syndrome) 09/11/2013   Perirectal abscess 07/19/2022   Raynaud's phenomenon 01/03/2021   Rosacea 01/01/2021   Supervision of high risk pregnancy, antepartum 05/18/2022          NURSING   PROVIDER  Office Location  [place]  Dating by  LMP c/w U/S at 7.3 wks  [facility] Model  Traditional  Anatomy U/S     Initiated care at   [employer]   English                 LAB RESULTS   Support Person  FOB-Bryan  Genetics  NIPS: LR M            AFP:    wnl                NT/IT (FT  only)           Carrier Screen  Horizon: neg x 4  Rhogam   O/Positive/-- (05/28    Past obstetric history: OB History  Gravida Para Term Preterm AB Living  2 1 1  0 0 1  SAB IAB Ectopic Multiple Live Births  0 0 0 0 1    # Outcome Date GA Lbr Len/2nd Weight Sex Type Anes PTL Lv  2 Current           1 Term 02/25/11 [redacted]w[redacted]d  4325 g F CS-LTranv Spinal  LIV     Birth Comments: NICU stay for persistent hypoglycemia Passed hearing screen     Complications: Breech presentation of fetus    Past Surgical History: Past Surgical History:  Procedure Laterality Date   CESAREAN SECTION  02/25/2011   Procedure: CESAREAN SECTION;  Surgeon: Leslie Andrea, MD;  Location: [facility];  Service: Gynecology;  Laterality: N/A;   CHOLECYSTECTOMY      Family History: Family History  Problem Relation Age of Onset   Bladder Cancer Mother  COPD Mother    Cancer Mother        bladder   Bladder Cancer Father    Cancer Father        bladder   Breast cancer Paternal Grandmother    Anesthesia problems Neg Hx    Colon cancer Neg Hx     Social History: Social History   Tobacco Use   Smoking status: Never    Passive exposure: Past   Smokeless tobacco: Never  Vaping Use   Vaping status: Never Used  Substance Use Topics   Alcohol use: Yes    Comment: rare   Drug use: No    Allergies:  Allergies  Allergen Reactions   Lexapro [Escitalopram Oxalate] Other (See Comments)    Blunted affect   Wellbutrin [Bupropion] Other (See Comments)    intolerant    Meds:  Medications Prior to Admission  Medication Sig Dispense Refill Last Dose   Accu-Chek Softclix Lancets lancets Use as instructed; check blood glucose 4 times daily 100 each 11    aspirin EC 81 MG tablet Take 1 tablet (81 mg total) by mouth at bedtime. Start taking when you are [redacted] weeks pregnant for rest of pregnancy for prevention of preeclampsia 300 tablet 2    Blood Glucose Monitoring Suppl (ACCU-CHEK GUIDE) w/Device KIT USE AS DIRECTED  1 kit 0    Calcium Carbonate Antacid (TUMS PO) Take by mouth as needed.      Cholecalciferol (VITAMIN D3) 50 MCG (2000 UT) capsule Take 1 capsule (2,000 Units total) by mouth daily.      Continuous Glucose Sensor (DEXCOM G7 SENSOR) MISC Apply 1 Dexcom sensor every 10 days. 3 each 6    Cyanocobalamin (VITAMIN B-12 PO) Take by mouth daily.      Ferrous Sulfate (IRON PO) Take 27 mg by mouth.      glucose blood (ACCU-CHEK GUIDE) test strip Use as instructed; check blood glucose 4 times daily 100 each 11    metFORMIN (GLUCOPHAGE) 500 MG tablet Take 1 tablet (500 mg total) by mouth at bedtime. (Patient not taking: Reported on 11/15/2022) 30 tablet 6    Prenatal Vit-Fe Fumarate-FA (PRENATAL VITAMIN PLUS LOW IRON) 27-1 MG TABS Take 1 tablet by mouth daily. 100 tablet 2     I have reviewed patient's Past Medical Hx, Surgical Hx, Family Hx, Social Hx, medications and allergies.   ROS:  Review of Systems  Constitutional:  Negative for chills and fever.  Genitourinary:  Positive for pelvic pain (mild) and vaginal discharge. Negative for frequency.   Other systems negative  Physical Exam  No data found. Constitutional: Well-developed, well-nourished female in no acute distress.  Cardiovascular: normal rate Respiratory: normal effort GI: Abd soft, non-tender, gravid appropriate for gestational age.   No rebound or guarding. MS: Extremities nontender, no edema, normal ROM Neurologic: Alert and oriented x 4.  GU: Neg CVAT.  PELVIC EXAM: Deferred due to PPROM                           Gross rupture of membranes, clear fluid, copious   FHT:  Baseline 140 , moderate variability, accelerations present, no decelerations Contractions: q 6 mins Irregular  and mild   Labs: No results found for this or any previous visit (from the past 24 hour(s)). O/Positive/-- (05/28 1126)  Imaging:  Pt informed that the ultrasound is considered a limited OB ultrasound and is not intended to be a complete  ultrasound exam.  Patient also informed that the ultrasound is not being completed with the intent of assessing for fetal or placental anomalies or any pelvic abnormalities.  Explained that the purpose of today's ultrasound is to assess for presentation.  Patient acknowledges the purpose of the exam and the limitations of the study.    Fetus is breech with head in maternal fundus  MAU Course/MDM: I have reviewed the triage vital signs and the nursing notes.   Pertinent labs & imaging results that were available during my care of the patient were reviewed by me and considered in my medical decision making (see chart for details).      I have reviewed her medical records including past results, notes and treatments.   I have ordered labs and reviewed results.  NST reviewed Consult Dr Para March with presentation, exam findings and test results.  Treatments in MAU included EFM, [place].    Assessment: Single IUP at [redacted]w[redacted]d Preterm Premature Rupture of Membranes Breech presentation Prior Cesarean delivery, desires repeat GDM, oral meds Macrosomia History of severe preeclampsia   Plan: Admit to OR Plan for repeat C/S MD to follow   Wynelle Bourgeois CNM, MSN Certified Nurse-Midwife 11/22/2022 10:17 PM

## 2022-11-23 ENCOUNTER — Ambulatory Visit: Payer: Medicaid Other

## 2022-11-23 ENCOUNTER — Encounter (HOSPITAL_COMMUNITY): Payer: Self-pay | Admitting: Obstetrics and Gynecology

## 2022-11-23 ENCOUNTER — Encounter (HOSPITAL_COMMUNITY)
Admission: AD | Disposition: A | Discharge status: Home / Self Care | Payer: Self-pay | Source: Home / Self Care | Attending: Obstetrics and Gynecology

## 2022-11-23 ENCOUNTER — Inpatient Hospital Stay (HOSPITAL_COMMUNITY): Payer: Medicaid Other | Admitting: Anesthesiology

## 2022-11-23 ENCOUNTER — Other Ambulatory Visit: Payer: Self-pay

## 2022-11-23 DIAGNOSIS — O3663X0 Maternal care for excessive fetal growth, third trimester, not applicable or unspecified: Secondary | ICD-10-CM | POA: Diagnosis not present

## 2022-11-23 DIAGNOSIS — O26893 Other specified pregnancy related conditions, third trimester: Secondary | ICD-10-CM | POA: Diagnosis not present

## 2022-11-23 DIAGNOSIS — Z3490 Encounter for supervision of normal pregnancy, unspecified, unspecified trimester: Secondary | ICD-10-CM

## 2022-11-23 DIAGNOSIS — O34211 Maternal care for low transverse scar from previous cesarean delivery: Secondary | ICD-10-CM | POA: Diagnosis not present

## 2022-11-23 DIAGNOSIS — O42913 Preterm premature rupture of membranes, unspecified as to length of time between rupture and onset of labor, third trimester: Secondary | ICD-10-CM | POA: Diagnosis not present

## 2022-11-23 DIAGNOSIS — Z3A34 34 weeks gestation of pregnancy: Secondary | ICD-10-CM

## 2022-11-23 DIAGNOSIS — K219 Gastro-esophageal reflux disease without esophagitis: Secondary | ICD-10-CM | POA: Diagnosis not present

## 2022-11-23 DIAGNOSIS — Z825 Family history of asthma and other chronic lower respiratory diseases: Secondary | ICD-10-CM | POA: Diagnosis not present

## 2022-11-23 DIAGNOSIS — Z8052 Family history of malignant neoplasm of bladder: Secondary | ICD-10-CM | POA: Diagnosis not present

## 2022-11-23 DIAGNOSIS — Z803 Family history of malignant neoplasm of breast: Secondary | ICD-10-CM | POA: Diagnosis not present

## 2022-11-23 DIAGNOSIS — Z3A00 Weeks of gestation of pregnancy not specified: Secondary | ICD-10-CM | POA: Diagnosis not present

## 2022-11-23 DIAGNOSIS — O99214 Obesity complicating childbirth: Secondary | ICD-10-CM | POA: Diagnosis not present

## 2022-11-23 DIAGNOSIS — Z302 Encounter for sterilization: Secondary | ICD-10-CM

## 2022-11-23 DIAGNOSIS — O24425 Gestational diabetes mellitus in childbirth, controlled by oral hypoglycemic drugs: Secondary | ICD-10-CM | POA: Diagnosis not present

## 2022-11-23 DIAGNOSIS — O24424 Gestational diabetes mellitus in childbirth, insulin controlled: Secondary | ICD-10-CM | POA: Diagnosis not present

## 2022-11-23 DIAGNOSIS — Z888 Allergy status to other drugs, medicaments and biological substances status: Secondary | ICD-10-CM | POA: Diagnosis not present

## 2022-11-23 DIAGNOSIS — O321XX0 Maternal care for breech presentation, not applicable or unspecified: Secondary | ICD-10-CM | POA: Diagnosis not present

## 2022-11-23 DIAGNOSIS — Z9049 Acquired absence of other specified parts of digestive tract: Secondary | ICD-10-CM | POA: Diagnosis not present

## 2022-11-23 DIAGNOSIS — O9962 Diseases of the digestive system complicating childbirth: Secondary | ICD-10-CM | POA: Diagnosis not present

## 2022-11-23 DIAGNOSIS — N838 Other noninflammatory disorders of ovary, fallopian tube and broad ligament: Secondary | ICD-10-CM | POA: Diagnosis not present

## 2022-11-23 DIAGNOSIS — O135 Gestational [pregnancy-induced] hypertension without significant proteinuria, complicating the puerperium: Secondary | ICD-10-CM | POA: Diagnosis not present

## 2022-11-23 DIAGNOSIS — O09523 Supervision of elderly multigravida, third trimester: Secondary | ICD-10-CM | POA: Diagnosis not present

## 2022-11-23 DIAGNOSIS — Z8551 Personal history of malignant neoplasm of bladder: Secondary | ICD-10-CM | POA: Diagnosis not present

## 2022-11-23 LAB — COMPREHENSIVE METABOLIC PANEL
ALT: 39 U/L (ref 0–44)
AST: 33 U/L (ref 15–41)
Albumin: 2.6 g/dL — ABNORMAL LOW (ref 3.5–5.0)
Alkaline Phosphatase: 69 U/L (ref 38–126)
Anion gap: 8 (ref 5–15)
BUN: 17 mg/dL (ref 6–20)
CO2: 22 mmol/L (ref 22–32)
Calcium: 9.5 mg/dL (ref 8.9–10.3)
Chloride: 105 mmol/L (ref 98–111)
Creatinine, Ser: 0.63 mg/dL (ref 0.44–1.00)
GFR, Estimated: >60 mL/min (ref >60)
Glucose, Bld: 93 mg/dL (ref 70–99)
Potassium: 3.8 mmol/L (ref 3.5–5.1)
Sodium: 135 mmol/L (ref 135–145)
Total Bilirubin: 0.5 mg/dL (ref 0.3–1.2)
Total Protein: 5.9 g/dL — ABNORMAL LOW (ref 6.5–8.1)

## 2022-11-23 LAB — CBC
HCT: 35.9 % — ABNORMAL LOW (ref 36.0–46.0)
Hemoglobin: 11.7 g/dL — ABNORMAL LOW (ref 12.0–15.0)
MCH: 29.5 pg (ref 26.0–34.0)
MCHC: 32.6 g/dL (ref 30.0–36.0)
MCV: 90.7 fL (ref 80.0–100.0)
Platelets: 283 10*3/uL (ref 150–400)
RBC: 3.96 MIL/uL (ref 3.87–5.11)
RDW: 14.5 % (ref 11.5–15.5)
WBC: 9.3 10*3/uL (ref 4.0–10.5)
nRBC: 0 % (ref 0.0–0.2)

## 2022-11-23 LAB — TYPE AND SCREEN
ABO/RH(D): O POS
Antibody Screen: NEGATIVE

## 2022-11-23 LAB — RPR: RPR Ser Ql: NONREACTIVE

## 2022-11-23 LAB — GLUCOSE, CAPILLARY: Glucose-Capillary: 112 mg/dL — ABNORMAL HIGH (ref 70–99)

## 2022-11-23 SURGERY — Surgical Case
Anesthesia: Spinal

## 2022-11-23 MED ORDER — ONDANSETRON HCL 4 MG/2ML IJ SOLN
INTRAMUSCULAR | Status: AC
  Filled 2022-11-23: qty 2

## 2022-11-23 MED ORDER — MORPHINE SULFATE (PF) 0.5 MG/ML IJ SOLN
INTRAMUSCULAR | Status: AC
  Filled 2022-11-23: qty 10

## 2022-11-23 MED ORDER — OXYTOCIN-SODIUM CHLORIDE 30-0.9 UT/500ML-% IV SOLN
INTRAVENOUS | Status: DC | PRN
  Administered 2022-11-23: 30 [IU] via INTRAVENOUS

## 2022-11-23 MED ORDER — FUROSEMIDE 20 MG PO TABS
20.0000 mg | ORAL_TABLET | Freq: Every day | ORAL | Status: DC
  Administered 2022-11-23 – 2022-11-26 (×4): 20 mg via ORAL
  Filled 2022-11-23 (×4): qty 1

## 2022-11-23 MED ORDER — DEXAMETHASONE SODIUM PHOSPHATE 4 MG/ML IJ SOLN
INTRAMUSCULAR | Status: AC
  Filled 2022-11-23: qty 1

## 2022-11-23 MED ORDER — ACETAMINOPHEN 500 MG PO TABS: 1000.0000 mg | ORAL_TABLET | Freq: Four times a day (QID) | ORAL | Status: DC

## 2022-11-23 MED ORDER — OXYTOCIN-SODIUM CHLORIDE 30-0.9 UT/500ML-% IV SOLN
INTRAVENOUS | Status: AC
  Filled 2022-11-23: qty 500

## 2022-11-23 MED ORDER — ZOLPIDEM TARTRATE 5 MG PO TABS: 5.0000 mg | ORAL_TABLET | Freq: Every evening | ORAL | Status: DC | PRN

## 2022-11-23 MED ORDER — SENNOSIDES-DOCUSATE SODIUM 8.6-50 MG PO TABS
2.0000 | ORAL_TABLET | Freq: Every day | ORAL | Status: DC
  Administered 2022-11-24 – 2022-11-26 (×3): 2 via ORAL
  Filled 2022-11-23 (×3): qty 2

## 2022-11-23 MED ORDER — DIPHENHYDRAMINE HCL 25 MG PO CAPS
25.0000 mg | ORAL_CAPSULE | Freq: Four times a day (QID) | ORAL | Status: DC | PRN
  Administered 2022-11-23: 25 mg via ORAL
  Filled 2022-11-23: qty 1

## 2022-11-23 MED ORDER — ONDANSETRON HCL 4 MG/2ML IJ SOLN
INTRAMUSCULAR | Status: DC | PRN
  Administered 2022-11-23: 4 mg via INTRAVENOUS

## 2022-11-23 MED ORDER — OXYTOCIN-SODIUM CHLORIDE 30-0.9 UT/500ML-% IV SOLN
2.5000 [IU]/h | INTRAVENOUS | Status: AC
Start: 2022-11-23 — End: 2022-11-23
  Administered 2022-11-23: 2.5 [IU]/h via INTRAVENOUS

## 2022-11-23 MED ORDER — DEXAMETHASONE SODIUM PHOSPHATE 4 MG/ML IJ SOLN
INTRAMUSCULAR | Status: AC
Start: 2022-11-23 — End: ?
  Filled 2022-11-23: qty 1

## 2022-11-23 MED ORDER — PHENYLEPHRINE 80 MCG/ML (10ML) SYRINGE FOR IV PUSH (FOR BLOOD PRESSURE SUPPORT)
PREFILLED_SYRINGE | INTRAVENOUS | Status: AC
  Filled 2022-11-23: qty 10

## 2022-11-23 MED ORDER — ACETAMINOPHEN 10 MG/ML IV SOLN
INTRAVENOUS | Status: AC
  Filled 2022-11-23: qty 100

## 2022-11-23 MED ORDER — MENTHOL 3 MG MT LOZG: 1.0000 | LOZENGE | OROMUCOSAL | Status: DC | PRN

## 2022-11-23 MED ORDER — KETOROLAC TROMETHAMINE 30 MG/ML IJ SOLN
30.0000 mg | Freq: Once | INTRAMUSCULAR | Status: AC | PRN
  Administered 2022-11-23: 30 mg via INTRAVENOUS

## 2022-11-23 MED ORDER — KETOROLAC TROMETHAMINE 30 MG/ML IJ SOLN
INTRAMUSCULAR | Status: AC
Start: 2022-11-23 — End: ?
  Filled 2022-11-23: qty 1

## 2022-11-23 MED ORDER — OXYCODONE HCL 5 MG PO TABS
5.0000 mg | ORAL_TABLET | Freq: Once | ORAL | Status: DC | PRN
Start: 2022-11-23 — End: 2022-11-23

## 2022-11-23 MED ORDER — COCONUT OIL OIL
1.0000 | TOPICAL_OIL | Status: DC | PRN
  Administered 2022-11-25: 1 via TOPICAL

## 2022-11-23 MED ORDER — FENTANYL CITRATE (PF) 100 MCG/2ML IJ SOLN: 25.0000 ug | INTRAMUSCULAR | Status: DC | PRN

## 2022-11-23 MED ORDER — FENTANYL CITRATE (PF) 100 MCG/2ML IJ SOLN
INTRAMUSCULAR | Status: DC | PRN
  Administered 2022-11-23: 15 ug via INTRAVENOUS

## 2022-11-23 MED ORDER — MORPHINE SULFATE (PF) 0.5 MG/ML IJ SOLN
INTRAMUSCULAR | Status: DC | PRN
  Administered 2022-11-23: 150 ug via EPIDURAL

## 2022-11-23 MED ORDER — ACETAMINOPHEN 10 MG/ML IV SOLN
INTRAVENOUS | Status: DC | PRN
  Administered 2022-11-23: 1000 mg via INTRAVENOUS

## 2022-11-23 MED ORDER — PHENYLEPHRINE HCL-NACL 20-0.9 MG/250ML-% IV SOLN
INTRAVENOUS | Status: AC
  Filled 2022-11-23: qty 250

## 2022-11-23 MED ORDER — CEFAZOLIN SODIUM-DEXTROSE 2-4 GM/100ML-% IV SOLN
2.0000 g | INTRAVENOUS | Status: AC
  Administered 2022-11-23: 2 g via INTRAVENOUS

## 2022-11-23 MED ORDER — SIMETHICONE 80 MG PO CHEW
80.0000 mg | CHEWABLE_TABLET | Freq: Three times a day (TID) | ORAL | Status: DC
  Administered 2022-11-23 – 2022-11-26 (×10): 80 mg via ORAL
  Filled 2022-11-23 (×10): qty 1

## 2022-11-23 MED ORDER — GABAPENTIN 100 MG PO CAPS
100.0000 mg | ORAL_CAPSULE | Freq: Two times a day (BID) | ORAL | Status: DC
  Administered 2022-11-23 – 2022-11-26 (×7): 100 mg via ORAL
  Filled 2022-11-23 (×7): qty 1

## 2022-11-23 MED ORDER — PHENYLEPHRINE HCL (PRESSORS) 10 MG/ML IV SOLN
INTRAVENOUS | Status: DC | PRN
  Administered 2022-11-23 (×2): 160 ug via INTRAVENOUS

## 2022-11-23 MED ORDER — SODIUM CHLORIDE 0.9 % IV SOLN: INTRAVENOUS | Status: DC | PRN

## 2022-11-23 MED ORDER — SOD CITRATE-CITRIC ACID 500-334 MG/5ML PO SOLN
30.0000 mL | Freq: Once | ORAL | Status: AC
  Administered 2022-11-23: 30 mL via ORAL
  Filled 2022-11-23: qty 30

## 2022-11-23 MED ORDER — ENOXAPARIN SODIUM 40 MG/0.4ML IJ SOSY
40.0000 mg | PREFILLED_SYRINGE | INTRAMUSCULAR | Status: DC
  Administered 2022-11-23 – 2022-11-25 (×3): 40 mg via SUBCUTANEOUS
  Filled 2022-11-23 (×3): qty 0.4

## 2022-11-23 MED ORDER — KETOROLAC TROMETHAMINE 30 MG/ML IJ SOLN
30.0000 mg | Freq: Four times a day (QID) | INTRAMUSCULAR | Status: AC
  Administered 2022-11-23 – 2022-11-24 (×4): 30 mg via INTRAVENOUS
  Filled 2022-11-23 (×5): qty 1

## 2022-11-23 MED ORDER — OXYCODONE HCL 5 MG/5ML PO SOLN: 5.0000 mg | Freq: Once | ORAL | Status: DC | PRN

## 2022-11-23 MED ORDER — OXYCODONE HCL 5 MG PO TABS
5.0000 mg | ORAL_TABLET | ORAL | Status: DC | PRN
  Administered 2022-11-24 – 2022-11-25 (×4): 10 mg via ORAL
  Filled 2022-11-23 (×4): qty 2

## 2022-11-23 MED ORDER — NIFEDIPINE ER OSMOTIC RELEASE 30 MG PO TB24
30.0000 mg | ORAL_TABLET | Freq: Every day | ORAL | Status: DC
  Administered 2022-11-23 – 2022-11-24 (×2): 30 mg via ORAL
  Filled 2022-11-23 (×2): qty 1

## 2022-11-23 MED ORDER — DIPHENHYDRAMINE HCL 50 MG/ML IJ SOLN
INTRAMUSCULAR | Status: AC
  Filled 2022-11-23: qty 1

## 2022-11-23 MED ORDER — BUPIVACAINE IN DEXTROSE 0.75-8.25 % IT SOLN
INTRATHECAL | Status: DC | PRN
  Administered 2022-11-23: 1.6 mL via INTRATHECAL

## 2022-11-23 MED ORDER — HYDROMORPHONE HCL 1 MG/ML IJ SOLN: 0.2000 mg | INTRAMUSCULAR | Status: DC | PRN

## 2022-11-23 MED ORDER — SODIUM CHLORIDE 0.9 % IV SOLN
500.0000 mg | INTRAVENOUS | Status: AC
  Administered 2022-11-23: 500 mg via INTRAVENOUS

## 2022-11-23 MED ORDER — SODIUM CHLORIDE 0.9 % IR SOLN
Status: DC | PRN
  Administered 2022-11-23: 1

## 2022-11-23 MED ORDER — FENTANYL CITRATE (PF) 100 MCG/2ML IJ SOLN
INTRAMUSCULAR | Status: AC
  Filled 2022-11-23: qty 2

## 2022-11-23 MED ORDER — SIMETHICONE 80 MG PO CHEW: 80.0000 mg | CHEWABLE_TABLET | ORAL | Status: DC | PRN

## 2022-11-23 MED ORDER — SODIUM CHLORIDE 0.9 % IV SOLN: 12.5000 mg | INTRAVENOUS | Status: DC | PRN

## 2022-11-23 MED ORDER — SOD CITRATE-CITRIC ACID 500-334 MG/5ML PO SOLN: 30.0000 mL | ORAL | Status: DC

## 2022-11-23 MED ORDER — DIPHENHYDRAMINE HCL 50 MG/ML IJ SOLN
12.5000 mg | Freq: Once | INTRAMUSCULAR | Status: AC
  Administered 2022-11-23: 12.5 mg via INTRAVENOUS

## 2022-11-23 MED ORDER — DEXAMETHASONE SODIUM PHOSPHATE 4 MG/ML IJ SOLN
INTRAMUSCULAR | Status: DC | PRN
  Administered 2022-11-23: 4 mg via INTRAVENOUS

## 2022-11-23 MED ORDER — WITCH HAZEL-GLYCERIN EX PADS: 1.0000 | MEDICATED_PAD | CUTANEOUS | Status: DC | PRN

## 2022-11-23 MED ORDER — PHENYLEPHRINE HCL-NACL 20-0.9 MG/250ML-% IV SOLN
INTRAVENOUS | Status: DC | PRN
  Administered 2022-11-23: 30 ug/min via INTRAVENOUS

## 2022-11-23 MED ORDER — IBUPROFEN 600 MG PO TABS
600.0000 mg | ORAL_TABLET | Freq: Four times a day (QID) | ORAL | Status: DC
  Administered 2022-11-24 – 2022-11-26 (×7): 600 mg via ORAL
  Filled 2022-11-23 (×8): qty 1

## 2022-11-23 MED ORDER — PRENATAL MULTIVITAMIN CH
1.0000 | ORAL_TABLET | Freq: Every day | ORAL | Status: DC
  Administered 2022-11-23 – 2022-11-25 (×3): 1 via ORAL
  Filled 2022-11-23 (×3): qty 1

## 2022-11-23 MED ORDER — DIBUCAINE (PERIANAL) 1 % EX OINT: 1.0000 | TOPICAL_OINTMENT | CUTANEOUS | Status: DC | PRN

## 2022-11-23 MED ORDER — ACETAMINOPHEN 500 MG PO TABS
1000.0000 mg | ORAL_TABLET | Freq: Four times a day (QID) | ORAL | Status: DC
  Administered 2022-11-23 – 2022-11-26 (×11): 1000 mg via ORAL
  Filled 2022-11-23 (×13): qty 2

## 2022-11-23 SURGICAL SUPPLY — 38 items
APL PRP STRL LF DISP 70% ISPRP (MISCELLANEOUS) ×2
APL SKNCLS STERI-STRIP NONHPOA (GAUZE/BANDAGES/DRESSINGS) ×1
BENZOIN TINCTURE PRP APPL 2/3 (GAUZE/BANDAGES/DRESSINGS) IMPLANT
CHLORAPREP W/TINT 26 (MISCELLANEOUS) ×2 IMPLANT
CLAMP UMBILICAL CORD (MISCELLANEOUS) ×1 IMPLANT
CLOTH BEACON ORANGE TIMEOUT ST (SAFETY) ×1 IMPLANT
DRSG OPSITE POSTOP 4X10 (GAUZE/BANDAGES/DRESSINGS) ×1 IMPLANT
ELECT REM PT RETURN 9FT ADLT (ELECTROSURGICAL) ×1
ELECTRODE REM PT RTRN 9FT ADLT (ELECTROSURGICAL) ×1 IMPLANT
EXTRACTOR VACUUM KIWI (MISCELLANEOUS) IMPLANT
GAUZE SPONGE 4X4 12PLY STRL LF (GAUZE/BANDAGES/DRESSINGS) IMPLANT
GLOVE BIO SURGEON STRL SZ 6 (GLOVE) ×1 IMPLANT
GLOVE BIOGEL PI IND STRL 7.0 (GLOVE) ×2 IMPLANT
GOWN STRL REUS W/TWL LRG LVL3 (GOWN DISPOSABLE) ×2 IMPLANT
KIT ABG SYR 3ML LUER SLIP (SYRINGE) IMPLANT
MAT PREVALON FULL STRYKER (MISCELLANEOUS) IMPLANT
NDL HYPO 25X5/8 SAFETYGLIDE (NEEDLE) IMPLANT
NEEDLE HYPO 22GX1.5 SAFETY (NEEDLE) IMPLANT
NEEDLE HYPO 25X5/8 SAFETYGLIDE (NEEDLE) IMPLANT
NS IRRIG 1000ML POUR BTL (IV SOLUTION) ×1 IMPLANT
PACK C SECTION WH (CUSTOM PROCEDURE TRAY) ×1 IMPLANT
PAD OB MATERNITY 4.3X12.25 (PERSONAL CARE ITEMS) ×1 IMPLANT
RETRACTOR TRAXI PANNICULUS (MISCELLANEOUS) IMPLANT
RETRACTOR WND ALEXIS 25 LRG (MISCELLANEOUS) IMPLANT
RTRCTR WOUND ALEXIS 25CM LRG (MISCELLANEOUS) ×1
STRIP CLOSURE SKIN 1/2X4 (GAUZE/BANDAGES/DRESSINGS) IMPLANT
SUT MON AB 4-0 PS1 27 (SUTURE) ×1 IMPLANT
SUT PLAIN 0 NONE (SUTURE) ×1 IMPLANT
SUT VIC AB 0 CT1 36 (SUTURE) ×3 IMPLANT
SUT VIC AB 2-0 CT1 27 (SUTURE) ×1
SUT VIC AB 2-0 CT1 TAPERPNT 27 (SUTURE) ×1 IMPLANT
SUT VIC AB 3-0 SH 27 (SUTURE) ×1
SUT VIC AB 3-0 SH 27XBRD (SUTURE) IMPLANT
SUTURE PLAIN GUT 2.0 ETHICON (SUTURE) IMPLANT
SYR CONTROL 10ML LL (SYRINGE) IMPLANT
TOWEL OR 17X24 6PK STRL BLUE (TOWEL DISPOSABLE) ×1 IMPLANT
TRAY FOLEY W/BAG SLVR 14FR LF (SET/KITS/TRAYS/PACK) IMPLANT
WATER STERILE IRR 1000ML POUR (IV SOLUTION) ×1 IMPLANT

## 2022-11-23 NOTE — Anesthesia Preprocedure Evaluation (Addendum)
Anesthesia Evaluation  Patient identified by MRN, date of birth, ID band Patient awake    Reviewed: Allergy & Precautions, NPO status , Patient's Chart, lab work & pertinent test results  History of Anesthesia Complications Negative for: history of anesthetic complications  Airway Mallampati: II  TM Distance: >3 FB Neck ROM: Full    Dental  (+) Dental Advisory Given   Pulmonary neg shortness of breath, sleep apnea (no CPAP) , neg recent URI   Pulmonary exam normal breath sounds clear to auscultation       Cardiovascular (-) hypertension(-) angina (-) Past MI, (-) Cardiac Stents and (-) CABG (-) dysrhythmias  Rhythm:Regular Rate:Normal  HLD, Raynaud's   Neuro/Psych  PSYCHIATRIC DISORDERS  Depression    negative neurological ROS     GI/Hepatic Neg liver ROS,GERD  ,,  Endo/Other  diabetes (not on medications), Gestational  Morbid obesity  Renal/GU negative Renal ROS     Musculoskeletal   Abdominal  (+) + obese  Peds  Hematology negative hematology ROS (+) Lab Results      Component                Value               Date                      WBC                      9.3                 11/22/2022                HGB                      11.7 (L)            11/22/2022                HCT                      35.9 (L)            11/22/2022                MCV                      90.7                11/22/2022                PLT                      283                 11/22/2022              Anesthesia Other Findings Takes baby aspirin. H/o c-section x1 in 2013.  Reproductive/Obstetrics (+) Pregnancy PCOS                             Anesthesia Physical Anesthesia Plan  ASA: 3  Anesthesia Plan: Spinal   Post-op Pain Management:    Induction:   PONV Risk Score and Plan: 2 and Ondansetron, Dexamethasone and Treatment may vary due to age or medical condition  Airway Management Planned:  Natural Airway  Additional Equipment:   Intra-op Plan:  Post-operative Plan:   Informed Consent: I have reviewed the patients History and Physical, chart, labs and discussed the procedure including the risks, benefits and alternatives for the proposed anesthesia with the patient or authorized representative who has indicated his/her understanding and acceptance.       Plan Discussed with: Anesthesiologist and CRNA  Anesthesia Plan Comments: (I have discussed risks of neuraxial anesthesia including but not limited to infection, bleeding, nerve injury, back pain, headache, seizures, and failure of block. Patient denies bleeding disorders and is not currently anticoagulated. Labs have been reviewed. Risks and benefits discussed. All patient's questions answered.  )       Anesthesia Quick Evaluation

## 2022-11-23 NOTE — Lactation Note (Signed)
This note was copied from a baby's chart.  NICU Lactation Consultation Note  Patient Name: Jessica Barron [id number] Date: 11/23/2022 Age:43 hours  Reason for consult: Initial assessment; Late-preterm 34-36.6wks; Maternal endocrine disorder; Other (Comment); NICU baby (Raynaud's syndrome, LGA, AMA) Type of Endocrine Disorder?: Diabetes; PCOS (GDMA2 (mefformin for 2 days) and diet)  SUBJECTIVE Visited with family of 9 hours old LPI NICU female; [name] is a P2 and has some experience breastfeeding but that was 11 years ago, she struggled with her supply. Her plan is to do both, direct breastfeeding along with pumping and bottle feeding. Provided a pumping top in size XL for hands on pumping, Heather P her RN in [place] will be setting up a pump once Ms. Walther returns to her room, this consult took place in the NICU. Reviewed pumping schedule, pumping log, pump settings, lactogenesis II/III, CDC handout and anticipatory guidelines.  OBJECTIVE Infant data: Mother's Current Feeding Choice: -- (NPO)  O2 Device: Room Air  Infant feeding assessment Scale for Readiness: 3   Maternal data: G2P1102 C-Section, Low Transverse Has patient been taught Hand Expression?: Yes Hand Expression Comments: No colostrum noted at this time Significant Breast History:: minimal breast changes during the pregnancy (pigmentation only) Current breast feeding challenges:: NICU admission Previous breastfeeding challenges?: Low milk supply Does the patient have breastfeeding experience prior to this delivery?: Yes How long did the patient breastfeed?: 3 months Pumping frequency: Pump was set up at 9 hours post-partum Flange Size: 21 Hands-free pumping top sizes: X-Large Chilton Si) Risk factor for low/delayed milk supply:: prematurity, A2GDM, PCOS  WIC Program: Yes WIC Referral Sent?: Yes What county?: [place]  ASSESSMENT Infant: Feeding Status: NPO  Maternal: Breasts are soft, they are also spaced  out  INTERVENTIONS/PLAN Interventions: Interventions: Breast feeding basics reviewed; Breast massage; Hand express; Coconut oil; DEBP; Education; Infant Driven Feeding Algorithm education; CDC Guidelines for Breast Pump Cleaning; NICU Pumping Log Tools: Pump; Flanges; Coconut oil; Hands-free pumping top Pump Education: Setup, frequency, and cleaning; Milk Storage  Plan: Encouraged pumping every 3 hours, ideally 8 pumping sessions/24 hours Breast massage, hand expression and coconut oil were also encouraged prior pumping STS care whenever possible  FOB present. All questions and concerns answered, family to contact [facility] services PRN.  Consult Status: NICU follow-up NICU Follow-up type: New admission follow up   Marciano Mundt S Rajesh Wyss 11/23/2022, 11:29 AM

## 2022-11-23 NOTE — H&P (Signed)
OBSTETRIC ADMISSION HISTORY AND PHYSICAL  Jessica Barron is a 42 y.o. female G2P1001 with IUP at [redacted]w[redacted]d by LMP=7wk [place] presenting for LOF. She reports +FMs, No LOF, no VB, no blurry vision, headaches or peripheral edema, and RUQ pain.  She plans on breastfeeding if possible. She request sterilization for birth control.  She received her prenatal [facility]   Dating: By LMP and 7 wk [place] --->  Estimated Date of Delivery: 12/30/22  Sono:    @[redacted]w[redacted]d , CWD, normal anatomy, breech presentation, 2924g, >99% EFW. AFI 16   Prenatal History/Complications:  Prior c-section AMA Macrosomia BMI 40s GDMA2 - was started on MTF on 10/14.  H/o PP preeclampsia  Past Medical History: Past Medical History:  Diagnosis Date   Abnormal Pap smear    repeat WNL   Constipation 02/18/2018   Depression    FH: bladder cancer 02/18/2018   GERD (gastroesophageal reflux disease)    Hip pain 01/03/2021   History of PCOS    Hyperlipidemia 02/11/2016   Menorrhagia with regular cycle 01/26/2022   PCOS (polycystic ovarian syndrome) 09/11/2013   Perirectal abscess 07/19/2022   Raynaud's phenomenon 01/03/2021   Rosacea 01/01/2021   Supervision of high risk pregnancy, antepartum 05/18/2022          NURSING   PROVIDER  Office Location  [place]  Dating by  LMP c/w U/S at 7.3 wks  [facility] Model  Traditional  Anatomy U/S     Initiated care at   [employer]   English                 LAB RESULTS   Support Person  FOB-Bryan  Genetics  NIPS: LR M            AFP:    wnl                NT/IT (FT only)           Carrier Screen  Horizon: neg x 4  Rhogam   O/Positive/-- (05/28    Past Surgical History: Past Surgical History:  Procedure Laterality Date   CESAREAN SECTION  02/25/2011   Procedure: CESAREAN SECTION;  Surgeon: Leslie Andrea, MD;  Location: [facility];  Service: Gynecology;  Laterality: N/A;   CHOLECYSTECTOMY      Obstetrical History: OB History     Gravida  2   Para  1   Term  1    Preterm  0   AB  0   Living  1      SAB  0   IAB  0   Ectopic  0   Multiple  0   Live Births  1           Social History Social History   Socioeconomic History   Marital status: Significant Other    Spouse name: Not on file   Number of children: Not on file   Years of education: Not on file   Highest education level: Not on file  Occupational History   Not on file  Tobacco Use   Smoking status: Never    Passive exposure: Past   Smokeless tobacco: Never  Vaping Use   Vaping status: Never Used  Substance and Sexual Activity   Alcohol use: Not Currently    Comment: rare   Drug use: No   Sexual activity: Yes  Birth control/protection: None  Other Topics Concern   Not on file  Social History Narrative   Married 2010- separated from husband 2020.     Social Determinants of Health   Financial Resource Strain: Not on file  Food Insecurity: Not on file  Transportation Needs: Not on file  Physical Activity: Not on file  Stress: Not on file  Social Connections: Not on file    Family History: Family History  Problem Relation Age of Onset   Bladder Cancer Mother    COPD Mother    Cancer Mother        bladder   Bladder Cancer Father    Cancer Father        bladder   Breast cancer Paternal Grandmother    Anesthesia problems Neg Hx    Colon cancer Neg Hx     Allergies: Allergies  Allergen Reactions   Lexapro [Escitalopram Oxalate] Other (See Comments)    Blunted affect   Wellbutrin [Bupropion] Other (See Comments)    intolerant    Medications Prior to Admission  Medication Sig Dispense Refill Last Dose   aspirin EC 81 MG tablet Take 1 tablet (81 mg total) by mouth at bedtime. Start taking when you are [redacted] weeks pregnant for rest of pregnancy for prevention of preeclampsia 300 tablet 2 11/21/2022   Calcium Carbonate Antacid (TUMS PO) Take by mouth as needed.   11/21/2022   Cholecalciferol (VITAMIN D3) 50 MCG (2000 UT) capsule Take 1 capsule  (2,000 Units total) by mouth daily.   11/21/2022   Cyanocobalamin (VITAMIN B-12 PO) Take by mouth daily.   11/21/2022   Ferrous Sulfate (IRON PO) Take 27 mg by mouth.   11/21/2022   Prenatal Vit-Fe Fumarate-FA (PRENATAL VITAMIN PLUS LOW IRON) 27-1 MG TABS Take 1 tablet by mouth daily. 100 tablet 2 11/21/2022   Accu-Chek Softclix Lancets lancets Use as instructed; check blood glucose 4 times daily 100 each 11    Blood Glucose Monitoring Suppl (ACCU-CHEK GUIDE) w/Device KIT USE AS DIRECTED 1 kit 0    Continuous Glucose Sensor (DEXCOM G7 SENSOR) MISC Apply 1 Dexcom sensor every 10 days. 3 each 6    glucose blood (ACCU-CHEK GUIDE) test strip Use as instructed; check blood glucose 4 times daily 100 each 11    metFORMIN (GLUCOPHAGE) 500 MG tablet Take 1 tablet (500 mg total) by mouth at bedtime. (Patient not taking: Reported on 11/15/2022) 30 tablet 6      Review of Systems   All systems reviewed and negative except as stated in HPI  Blood pressure (!) 150/70, pulse 89, temperature 98.1 F (36.7 C), resp. rate 17, height 5\' 2"  (1.575 m), weight 116.6 kg, last menstrual period 03/25/2022, SpO2 99%. General appearance: alert, cooperative, and no distress Lungs: clear to auscultation bilaterally Heart: regular rate and rhythm Abdomen: soft, non-tender; bowel sounds normal Extremities: Homans sign is negative, no sign of DVT Presentation: breech Fetal monitoringBaseline: 130 bpm, Variability: Good {> 6 bpm), Accelerations: Reactive, and Decelerations: Absent Uterine activity: Q50min     Prenatal labs: ABO, Rh: --/--/O POS (10/29 2342) Antibody: NEG (10/29 2342) Rubella: 2.49 (05/28 1126) RPR: Non Reactive (09/05 1225)  HBsAg: Negative (05/28 1126)  HIV: Non Reactive (09/05 1225)  GBS:    2 hr Glucola - not done - doing self checks Genetic screening NIPT wnl- LR female Anatomy US normal  Prenatal Transfer Tool  Maternal Diabetes: Yes:  Diabetes Type:  Insulin/Medication  controlled Genetic Screening: Normal Maternal Ultrasounds/Referrals: Normal Fetal  Ultrasounds or other Referrals:  None Maternal Substance Abuse:  No Significant Maternal Medications:  None Significant Maternal Lab Results:  None Number of Prenatal Visits:greater than 3 verified prenatal visits Other Comments:  None  Results for orders placed or performed during the hospital encounter of 11/22/22 (from the past 24 hour(s))  Fern Test   Collection Time: 11/22/22 11:20 PM  Result Value Ref Range   POCT Fern Test Positive = ruptured amniotic membanes   CBC   Collection Time: 11/22/22 11:42 PM  Result Value Ref Range   WBC 9.3 4.0 - 10.5 K/uL   RBC 3.96 3.87 - 5.11 MIL/uL   Hemoglobin 11.7 (L) 12.0 - 15.0 g/dL   HCT 40.9 (L) 81.1 - 91.4 %   MCV 90.7 80.0 - 100.0 fL   MCH 29.5 26.0 - 34.0 pg   MCHC 32.6 30.0 - 36.0 g/dL   RDW 78.2 95.6 - 21.3 %   Platelets 283 150 - 400 K/uL   nRBC 0.0 0.0 - 0.2 %  Type and screen [facility]   Collection Time: 11/22/22 11:42 PM  Result Value Ref Range   ABO/RH(D) O POS    Antibody Screen NEG    Sample Expiration      11/25/2022,2359 Performed at [facility], [address] [place], [place] [address]   Comprehensive metabolic panel   Collection Time: 11/22/22 11:51 PM  Result Value Ref Range   Sodium 135 135 - 145 mmol/L   Potassium 3.8 3.5 - 5.1 mmol/L   Chloride 105 98 - 111 mmol/L   CO2 22 22 - 32 mmol/L   Glucose, Bld 93 70 - 99 mg/dL   BUN 17 6 - 20 mg/dL   Creatinine, Ser 8.46 0.44 - 1.00 mg/dL   Calcium 9.5 8.9 - 96.2 mg/dL   Total Protein 5.9 (L) 6.5 - 8.1 g/dL   Albumin 2.6 (L) 3.5 - 5.0 g/dL   AST 33 15 - 41 U/L   ALT 39 0 - 44 U/L   Alkaline Phosphatase 69 38 - 126 U/L   Total Bilirubin 0.5 0.3 - 1.2 mg/dL   GFR, Estimated >95 >28 mL/min   Anion gap 8 5 - 15    Patient Active Problem List   Diagnosis Date Noted   Gestational diabetes 11/07/2022   Advanced maternal age in multigravida  06/21/2022   History of postpartum severe preeclampsia 06/21/2022   H/O macrosomia in infant in prior pregnancy, currently pregnant 06/21/2022   Supervision of high-risk pregnancy 05/18/2022   Previous cesarean delivery, antepartum 05/18/2022   Maternal morbid obesity, antepartum (HCC) 05/18/2022    Assessment/Plan:  Lluliana Ferrell is a 43 y.o. G2P1001 at [redacted]w[redacted]d here for confirmed ROM and malpresentation as well as history of c-section and macrosomia.   #FWB:  Reactive/Category 1  #ID:  Ancef/Azithro in the OR #MOF:  Wishes to try to breastfeed #MOC: BTL/BTS #Circ:  Yes  #BP elevated - preeclampsia labs wnl. She is asymptomatic.   The risks of surgery were discussed with the patient including but were not limited to: bleeding which may require transfusion or reoperation; infection which may require antibiotics; injury to bowel, bladder, ureters or other surrounding organs; injury to the fetus; need for additional procedures including hysterectomy in the event of a life-threatening hemorrhage; formation of adhesions; placental abnormalities with subsequent pregnancies; incisional problems; thromboembolic phenomenon and other postoperative/anesthesia complications.  She confirms desire from permanent sterilization.   The patient concurred with the proposed plan, giving  informed written consent for the procedure.   Patient has been NPO since 630 pm and she will remain NPO for procedure. Anesthesia and OR aware. Preoperative prophylactic antibiotics and SCDs ordered on call to the OR.  To OR when ready.   Milas Hock, MD  11/23/2022, 12:56 AM

## 2022-11-23 NOTE — Anesthesia Procedure Notes (Signed)
Spinal  Patient location during procedure: OR Start time: 11/23/2022 1:23 AM End time: 11/23/2022 1:26 AM Reason for block: surgical anesthesia Staffing Performed: anesthesiologist  Anesthesiologist: Linton Rump, MD Performed by: Linton Rump, MD Authorized by: Linton Rump, MD   Preanesthetic Checklist Completed: patient identified, IV checked, site marked, risks and benefits discussed, surgical consent, monitors and equipment checked, pre-op evaluation and timeout performed Spinal Block Patient position: sitting Prep: DuraPrep Patient monitoring: blood pressure and continuous pulse ox Approach: midline Location: L3-4 Injection technique: single-shot Needle Needle type: Pencan  Needle gauge: 24 G Needle length: 9 cm Assessment Sensory level: T4 Additional Notes Risks and benefits of neuraxial anesthesia including, but not limited to, infection, bleeding, local anesthetic toxicity, headache, hypotension, back pain, block failure, etc. were discussed with the patient. The patient expressed understanding and consented to the procedure. I confirmed that the patient has no bleeding disorders and is not taking blood thinners. I confirmed the patient's last platelet count with the nurse. Monitors were applied. A time-out was performed immediately prior to the procedure. Sterile technique was used throughout the whole procedure.   1 attempt(s)

## 2022-11-23 NOTE — Op Note (Addendum)
Jessica Barron PROCEDURE DATE: 11/23/2022  PREOPERATIVE DIAGNOSES: Intrauterine pregnancy at [redacted]w[redacted]d weeks gestation; macrosomia and malpresentation: breech , SROM  POSTOPERATIVE DIAGNOSES: The same  PROCEDURE: Repeat Low Transverse Cesarean Section  SURGEON:  Dr. Milas Hock  ASSISTANT:  Dr. Wyn Forster An experienced assistant was required given the standard of surgical care given the complexity of the case.  This assistant was needed for exposure, dissection, suctioning, retraction, instrument exchange, and for overall help during the procedure.  ANESTHESIOLOGY TEAM: Anesthesiologist: Linton Rump, MD CRNA: Armanda Heritage, CRNA  INDICATIONS: Jessica Barron is a 42 y.o. [phone] at [redacted]w[redacted]d here for cesarean section secondary to the indications listed under preoperative diagnoses; please see preoperative note for further details.  The risks of surgery were discussed with the patient including but were not limited to: bleeding which may require transfusion or reoperation; infection which may require antibiotics; injury to bowel, bladder, ureters or other surrounding organs; injury to the fetus; need for additional procedures including hysterectomy in the event of a life-threatening hemorrhage; formation of adhesions; placental abnormalities wth subsequent pregnancies; incisional problems; thromboembolic phenomenon and other postoperative/anesthesia complications.  The patient concurred with the proposed plan, giving informed written consent for the procedure.    FINDINGS:  Viable female infant in breech presentation.  Apgars 8 and 9.  Clear amniotic fluid.  Intact placenta, three vessel cord.  Normal uterus, fallopian tubes and ovaries bilaterally.  ANESTHESIA: Spinal INTRAVENOUS FLUIDS: 1100 ml   ESTIMATED BLOOD LOSS: 287 ml URINE OUTPUT:  400 ml SPECIMENS: Placenta sent to L&D COMPLICATIONS: None immediate  PROCEDURE IN DETAIL:  The patient preoperatively received  intravenous antibiotics and had sequential compression devices applied to her lower extremities.  She was then taken to the operating room where spinal anesthesia was administered was administed. She was then placed in a dorsal supine position with a leftward tilt, and prepped and draped in a sterile manner.  A foley catheter was placed into her bladder and attached to constant gravity.    After an adequate timeout was performed, a Pfannenstiel skin incision was made with scalpel on her preexisting scar and carried through to the underlying layer of fascia. The fascia was incised in the midline, and this incision was extended bilaterally using the Mayo scissors.  Kocher clamps were applied to the superior aspect of the fascial incision and the underlying rectus muscles were dissected off bluntly and sharply.  A similar process was carried out on the inferior aspect of the fascial incision. The rectus muscles were separated in the midline and the peritoneum was entered bluntly. The Alexis self-retaining retractor was introduced into the abdominal cavity.  Attention was turned to the lower uterine segment where a low transverse hysterotomy was made with a scalpel and extended bilaterally bluntly.  The infant was successfully delivered, the cord was clamped and cut after one minute, and the infant was handed over to the awaiting neonatology team. Uterine massage was then administered, and the placenta delivered intact with a three-vessel cord. The uterus was then cleared of clots and debris.    The hysterotomy was closed with 0 Vicryl in a running locked fashion in a single layer. Incision inspected and hemostatic. Attention was then turned to the patient's uterus, the for bilateral salpingectomy. The distal 5 cm portion of the right tube including fimbria and mesosalpinx was doubly clamped using Kelly clamps and the distal portion of the tube removed using Metzenbaum scissors. The remaining pedicle was tied off  using a 2-0 plain  gut free tie and a subsequent suture ligation with 2-0 plain gut. The pedicle was inspected and found to be bleeding.  A Haney stitch was placed with 3.0 Vicryl suture with great hemostasis.  A similar process was carried out on the left side allowing for bilateral tubal sterilization.  Good hemostasis was noted overall.  The pelvis was cleared of all clot and debris. The retractor was removed.  The peritoneum was closed with a 0 Vicryl running stitch. The fascia was then closed using 0 Vicryl in a running fashion.  The subcutaneous layer was irrigated and everything was hemostatic  and due to depth of tissue, it was re-approximated with 2-0 plain gut in a running fashion. The skin was closed with a 4-0 monocryl subcuticular stitch. The patient tolerated the procedure well. Sponge, instrument and needle counts were correct x 3.  She was taken to the recovery room in stable condition.    Wyn Forster, MD FMOB Fellow, Faculty practice [facility], Center for [facility]

## 2022-11-23 NOTE — Transfer of Care (Signed)
Immediate Anesthesia Transfer of Care Note  Patient: Jessica Barron  Procedure(s) Performed: CESAREAN SECTION WITH BILATERAL TUBAL LIGATION  Patient Location: PACU  Anesthesia Type:Spinal  Level of Consciousness: awake, alert , and oriented  Airway & Oxygen Therapy: Patient Spontanous Breathing  Post-op Assessment: Report given to RN and Post -op Vital signs reviewed and stable  Post vital signs: Reviewed and stable  Last Vitals:  Vitals Value Taken Time  BP 123/67 11/23/22 0251  Temp    Pulse 75 11/23/22 0253  Resp 11 11/23/22 0253  SpO2 99 % 11/23/22 0253  Vitals shown include unfiled device data.  Last Pain:  Vitals:   11/23/22 0030  PainSc: 5          Complications: No notable events documented.

## 2022-11-24 ENCOUNTER — Other Ambulatory Visit: Payer: Self-pay

## 2022-11-24 LAB — GLUCOSE, CAPILLARY: Glucose-Capillary: 85 mg/dL (ref 70–99)

## 2022-11-24 LAB — CBC
HCT: 36.2 % (ref 36.0–46.0)
Hemoglobin: 11.6 g/dL — ABNORMAL LOW (ref 12.0–15.0)
MCH: 29.1 pg (ref 26.0–34.0)
MCHC: 32 g/dL (ref 30.0–36.0)
MCV: 91 fL (ref 80.0–100.0)
Platelets: 302 10*3/uL (ref 150–400)
RBC: 3.98 MIL/uL (ref 3.87–5.11)
RDW: 14.8 % (ref 11.5–15.5)
WBC: 10.1 10*3/uL (ref 4.0–10.5)
nRBC: 0 % (ref 0.0–0.2)

## 2022-11-24 LAB — SURGICAL PATHOLOGY

## 2022-11-24 MED ORDER — NIFEDIPINE ER OSMOTIC RELEASE 30 MG PO TB24
30.0000 mg | ORAL_TABLET | Freq: Two times a day (BID) | ORAL | Status: DC
  Administered 2022-11-24 – 2022-11-26 (×4): 30 mg via ORAL
  Filled 2022-11-24 (×4): qty 1

## 2022-11-24 NOTE — Lactation Note (Signed)
This note was copied from a baby's chart.  NICU Lactation Consultation Note  Patient Name: Jessica Barron [id number] Date: 11/24/2022 Age:43 hours  Reason for consult: Follow-up assessment; NICU baby; Late-preterm 34-36.6wks; Maternal endocrine disorder; Other (Comment) (AMA, LGA, Raynaud's syndrome) Type of Endocrine Disorder?: Diabetes; PCOS (GDMA2 (mefformin and diet))  SUBJECTIVE Visited with family of 1 hours old LPI NICU female; Jessica Barron is a P2 and reported that she's been pumping and only getting droplets from one side, praised her for her efforts. Noticed that pumping hasn't been consistent. She voiced that she's been stressed out because of some recent developments (personal) but that she'll try to pump following the 3 hours schedule as close as she can; she's aware she should be pumping more often. Showed empathy and listened to what she wanted to share, and I wished her that she could find her lost rings and her mama bear necklace.   OBJECTIVE Infant data: Mother's Current Feeding Choice: Breast Milk and Donor Milk  O2 Device: Room Air  Infant feeding assessment Scale for Readiness: 3   Maternal data: G2P1102 C-Section, Low Transverse Has patient been taught Hand Expression?: Yes Hand Expression Comments: No colostrum noted at this time Significant Breast History:: minimal breast changes during the pregnancy (pigmentation only) Current breast feeding challenges:: NICU admission Previous breastfeeding challenges?: Low milk supply Does the patient have breastfeeding experience prior to this delivery?: Yes How long did the patient breastfeed?: 3 months Pumping frequency: 2 times/24 hours Pumped volume: 0 mL (drops from R side) Flange Size: 21 Hands-free pumping top sizes: X-Large Chilton Si) Risk factor for low/delayed milk supply:: prematurity, A2GDM, PCOS  WIC Program: Yes WIC Referral Sent?: Yes What county?: [place]  ASSESSMENT Infant: Feeding Status:  Scheduled 9-12-3-6 Feeding method: Tube/Gavage (Bolus)  Maternal: Milk volume: Normal  INTERVENTIONS/PLAN Interventions: Interventions: Breast feeding basics reviewed; DEBP; Education; Coconut oil Tools: Pump; Flanges; Coconut oil; Hands-free pumping top Pump Education: Setup, frequency, and cleaning; Milk Storage  Plan: Encouraged pumping every 3 hours, ideally 8 pumping sessions/24 hours Breast massage, hand expression and coconut oil were also encouraged prior pumping STS care whenever possible   No other support person at this time. All questions and concerns answered, family to contact [facility] services PRN.  Consult Status: NICU follow-up NICU Follow-up type: Maternal D/C visit   Jessica Barron Jessica Barron 11/24/2022, 3:36 PM

## 2022-11-24 NOTE — Social Work (Signed)
Patient screened out for psychosocial assessment since none of the following apply:  Psychosocial stressors documented in mother or baby's chart Gestation less than 32 weeks Code at delivery  Infant with anomalies  Please contact the Clinical Social Worker if specific needs arise, by MOB's request, or if MOB scores greater than 9/yes to question 10 on Edinburgh Postpartum Depression Screen.  Vivi Barrack, MSW, LCSW Clinical Social Worker  [phone] 05/01/2022  11:13 AM

## 2022-11-24 NOTE — Progress Notes (Signed)
Subjective: Postpartum Day 1: Cesarean Delivery Patient reports incisional pain, tolerating PO, and no problems voiding.    Objective: Vital signs in last 24 hours: Temp:  [97.6 F (36.4 C)-98.1 F (36.7 C)] 97.8 F (36.6 C) (10/31 0508) Pulse Rate:  [68-88] 71 (10/31 0508) Resp:  [17-19] 17 (10/31 0508) BP: (117-152)/(62-89) 130/71 (10/31 0508) SpO2:  [95 %-100 %] 100 % (10/31 1610)  Physical Exam:  General: alert, cooperative, and no distress Lochia: appropriate Uterine Fundus: firm Incision: healing well, no significant drainage DVT Evaluation: No evidence of DVT seen on physical exam.  Recent Labs    11/22/22 2342  HGB 11.7*  HCT 35.9*    Assessment/Plan: Status post Cesarean section. Doing well postoperatively.  Continue current care.  Scheryl Darter, MD 11/24/2022, 7:45 AM

## 2022-11-24 NOTE — Anesthesia Postprocedure Evaluation (Signed)
Anesthesia Post Note  Patient: Debralee Winiarski  Procedure(s) Performed: CESAREAN SECTION WITH BILATERAL TUBAL LIGATION     Patient location during evaluation: PACU Anesthesia Type: Spinal Level of consciousness: awake Pain management: pain level controlled Vital Signs Assessment: post-procedure vital signs reviewed and stable Respiratory status: spontaneous breathing, respiratory function stable and nonlabored ventilation Cardiovascular status: blood pressure returned to baseline and stable Postop Assessment: no headache, no backache and no apparent nausea or vomiting Anesthetic complications: no   No notable events documented.  Last Vitals:  Vitals:   11/24/22 0508 11/24/22 0800  BP: 130/71 (!) 146/86  Pulse: 71 76  Resp: 17 18  Temp: 36.6 C (!) 35.9 C  SpO2: 100% 100%    Last Pain:  Vitals:   11/24/22 0800  TempSrc: Axillary  PainSc:                  Linton Rump

## 2022-11-25 NOTE — Lactation Note (Signed)
This note was copied from a baby's chart.  NICU Lactation Consultation Note  Patient Name: Jessica Barron IONGE'X Date: 11/25/2022 Age:43 hours  Reason for consult: Follow-up assessment; NICU baby; Late-preterm 34-36.6wks Type of Endocrine Disorder?: PCOS; Diabetes (GDM Metformin)  SUBJECTIVE  LC in to visit with P2 Mom of LPTI in the NICU.  Baby "Jessica Barron" is on room air and being gavage fed.  Encouraged Mom to do STS with baby, watching for feeding readiness.  Mom aware she can request LC prn.  Mom has been trying to pump consistently, last pumping she expressed a drop.  Mom very excited about this.  Mom aware of benefit of STS and pumping afterwards.  Mom to be discharged 11/2.  Mom aware of lactation support available and encouraged to call prn.  OBJECTIVE Infant data: Mother's Current Feeding Choice: Breast Milk and Donor Milk  O2 Device: Room Air  Infant feeding assessment Scale for Readiness: 2   Maternal data: G2P1102 C-Section, Low Transverse Pumping frequency: 6 times per 24 hrs Pumped volume: 0 mL (drops) Flange Size: 21 Hands-free pumping top sizes: [place] Guys Chilton Si)  WIC Program: Yes WIC Referral Sent?: Yes What county?: [place] (Encouraged to call WIC office) Pump:  (talked about getting a WIC loaner if DC'd on weekend)   Feeding Status: Scheduled 9-12-3-6 Feeding method: Tube/Gavage (Bolus)  Maternal: Milk volume: Normal  INTERVENTIONS/PLAN Interventions: Interventions: Skin to skin; Breast massage; Hand express; DEBP Tools: Pump; Flanges Pump Education: Setup, frequency, and cleaning; Milk Storage  Plan: Consult Status: NICU follow-up NICU Follow-up type: Maternal D/C visit; Verify onset of copious milk; Verify absence of engorgement   [name] 11/25/2022, 12:49 PM

## 2022-11-25 NOTE — Plan of Care (Signed)
  Problem: Education: Goal: Knowledge of General Education information will improve Description: Including pain rating scale, medication(s)/side effects and non-pharmacologic comfort measures Outcome: Progressing   Problem: Health Behavior/Discharge Planning: Goal: Ability to manage health-related needs will improve Outcome: Progressing   Problem: Clinical Measurements: Goal: Ability to maintain clinical measurements within normal limits will improve Outcome: Progressing Goal: Will remain free from infection Outcome: Progressing Goal: Diagnostic test results will improve Outcome: Progressing   Problem: Activity: Goal: Risk for activity intolerance will decrease Outcome: Progressing   Problem: Elimination: Goal: Will not experience complications related to bowel motility Outcome: Completed/Met Goal: Will not experience complications related to urinary retention Outcome: Progressing

## 2022-11-25 NOTE — Plan of Care (Signed)
  Problem: Education: Goal: Knowledge of General Education information will improve Description: Including pain rating scale, medication(s)/side effects and non-pharmacologic comfort measures Outcome: Progressing   Problem: Health Behavior/Discharge Planning: Goal: Ability to manage health-related needs will improve Outcome: Progressing   Problem: Clinical Measurements: Goal: Ability to maintain clinical measurements within normal limits will improve Outcome: Progressing Goal: Will remain free from infection Outcome: Progressing Goal: Diagnostic test results will improve Outcome: Progressing Goal: Respiratory complications will improve Outcome: Progressing Goal: Cardiovascular complication will be avoided Outcome: Progressing   Problem: Activity: Goal: Risk for activity intolerance will decrease Outcome: Progressing   Problem: Nutrition: Goal: Adequate nutrition will be maintained Outcome: Progressing   Problem: Coping: Goal: Level of anxiety will decrease Outcome: Progressing   Problem: Elimination: Goal: Will not experience complications related to urinary retention Outcome: Progressing   Problem: Pain Management: Goal: General experience of comfort will improve Outcome: Progressing   Problem: Safety: Goal: Ability to remain free from injury will improve Outcome: Progressing   Problem: Skin Integrity: Goal: Risk for impaired skin integrity will decrease Outcome: Progressing   Problem: Education: Goal: Knowledge of the prescribed therapeutic regimen will improve Outcome: Progressing Goal: Understanding of sexual limitations or changes related to disease process or condition will improve Outcome: Progressing Goal: Individualized Educational Video(s) Outcome: Progressing   Problem: Self-Concept: Goal: Communication of feelings regarding changes in body function or appearance will improve Outcome: Progressing   Problem: Skin Integrity: Goal: Demonstration  of wound healing without infection will improve Outcome: Progressing   Problem: Education: Goal: Knowledge of condition will improve Outcome: Progressing Goal: Individualized Educational Video(s) Outcome: Progressing Goal: Individualized Newborn Educational Video(s) Outcome: Progressing   Problem: Activity: Goal: Will verbalize the importance of balancing activity with adequate rest periods Outcome: Progressing Goal: Ability to tolerate increased activity will improve Outcome: Progressing   Problem: Coping: Goal: Ability to identify and utilize available resources and services will improve Outcome: Progressing   Problem: Life Cycle: Goal: Chance of risk for complications during the postpartum period will decrease Outcome: Progressing   Problem: Role Relationship: Goal: Ability to demonstrate positive interaction with newborn will improve Outcome: Progressing   Problem: Skin Integrity: Goal: Demonstration of wound healing without infection will improve Outcome: Progressing

## 2022-11-25 NOTE — Progress Notes (Signed)
Postpartum Day 2: Cesarean Delivery  Subjective: Patient reports incisional pain with some movements, tolerating PO, + flatus, and no problems voiding.  Denies any headaches, visual symptoms, RUQ/epigastric pain or other concerning symptoms. Baby boy doing well in NICU,   Objective: Vital signs in last 24 hours: Temp:  [96.7 F (35.9 C)-98.6 F (37 C)] 98.6 F (37 C) (11/01 0349) Pulse Rate:  [76-88] 78 (11/01 0349) Resp:  [18] 18 (11/01 0349) BP: (126-158)/(76-96) 134/76 (11/01 0349) SpO2:  [98 %-100 %] 99 % (11/01 0349) Patient Vitals for the past 24 hrs:  BP Temp Temp src Pulse Resp SpO2  11/25/22 0349 134/76 98.6 F (37 C) Oral 78 18 99 %  11/24/22 2315 (!) 148/79 97.7 F (36.5 C) Oral 84 18 99 %  11/24/22 1914 126/79 97.6 F (36.4 C) Oral 83 18 99 %  11/24/22 1543 (!) 147/85 -- -- 86 -- --  11/24/22 1537 (!) 158/96 97.6 F (36.4 C) Oral 87 18 98 %  11/24/22 1149 (!) 145/79 98.1 F (36.7 C) Oral 88 18 100 %  11/24/22 0800 (!) 146/86 (!) 96.7 F (35.9 C) Axillary 76 18 100 %   Physical Exam:  General: alert and no distress Lochia: appropriate Uterine Fundus: firm Incision: healing well, no significant drainage, dressing in place DVT Evaluation: No evidence of DVT seen on physical exam. Negative Homan's sign. No cords or calf tenderness. No significant calf/ankle edema.  Recent Labs    11/22/22 2342 11/24/22 1721  HGB 11.7* 11.6*  HCT 35.9* 36.2   CBG (last 3)  Recent Labs    11/23/22 0257 11/24/22 0511  GLUCAP 112* 85    Assessment/Plan: Status post Cesarean section and bilateral tubal sterilization. Doing well postoperatively.  Stable BP on Nifedipine XR 30 mg bid and on Lasix regimen, continue to monitor and titrate as needed. Stable CBGs Encourage pain medications as needed, OOB. On Lovenox for VTE prophylaxis. Breastfeeding and bottlefeeding. Continue current care. Discharge to home tomorrow if continues to remain stable.  Jaynie Collins,  MD 11/25/2022, 6:51 AM

## 2022-11-26 ENCOUNTER — Other Ambulatory Visit (HOSPITAL_COMMUNITY): Payer: Self-pay

## 2022-11-26 MED ORDER — FUROSEMIDE 20 MG PO TABS
20.0000 mg | ORAL_TABLET | Freq: Every day | ORAL | 0 refills | Status: DC
  Filled 2022-11-26: qty 3, 3d supply, fill #0

## 2022-11-26 MED ORDER — NIFEDIPINE ER 30 MG PO TB24
30.0000 mg | ORAL_TABLET | Freq: Two times a day (BID) | ORAL | 0 refills | Status: DC
  Filled 2022-11-26: qty 60, 30d supply, fill #0

## 2022-11-26 MED ORDER — SIMETHICONE 80 MG PO CHEW
80.0000 mg | CHEWABLE_TABLET | ORAL | 0 refills | Status: DC | PRN
  Filled 2022-11-26: qty 30, 5d supply, fill #0

## 2022-11-26 MED ORDER — IBUPROFEN 600 MG PO TABS
600.0000 mg | ORAL_TABLET | Freq: Four times a day (QID) | ORAL | 0 refills | Status: DC | PRN
  Filled 2022-11-26: qty 30, 8d supply, fill #0

## 2022-11-26 MED ORDER — DOCUSATE SODIUM 100 MG PO CAPS
100.0000 mg | ORAL_CAPSULE | Freq: Two times a day (BID) | ORAL | 1 refills | Status: AC
  Filled 2022-11-26: qty 28, 14d supply, fill #0

## 2022-11-26 MED ORDER — SENNOSIDES-DOCUSATE SODIUM 8.6-50 MG PO TABS
2.0000 | ORAL_TABLET | Freq: Every evening | ORAL | 0 refills | Status: DC | PRN
  Filled 2022-11-26: qty 7, 3d supply, fill #0

## 2022-11-26 MED ORDER — GABAPENTIN 100 MG PO CAPS
100.0000 mg | ORAL_CAPSULE | Freq: Two times a day (BID) | ORAL | 0 refills | Status: DC
  Filled 2022-11-26: qty 14, 7d supply, fill #0

## 2022-11-26 MED ORDER — OXYCODONE-ACETAMINOPHEN 5-325 MG PO TABS
1.0000 | ORAL_TABLET | Freq: Four times a day (QID) | ORAL | 0 refills | Status: DC | PRN
  Filled 2022-11-26: qty 20, 3d supply, fill #0

## 2022-11-26 NOTE — Discharge Summary (Signed)
Postpartum Discharge Summary  Date of Service updated***     Patient Name: Jessica Barron DOB: [birth year] MRN: [id number]  Date of admission: 11/22/2022 Delivery date:11/23/2022 Delivering provider: Milas Hock Date of discharge: 11/26/2022  Admitting diagnosis: Pregnancy [Z34.90] Intrauterine pregnancy: [redacted]w[redacted]d     Secondary diagnosis:  Principal Problem:   Pregnancy  Additional problems: ***    Discharge diagnosis: {DX.:23714}                                              Post partum procedures:{Postpartum procedures:23558} Augmentation: {Augmentation:20782} Complications: {OB Labor/Delivery Complications:20784}  Hospital course: {Courses:23701}  Magnesium Sulfate received: {Mag received:30440022} BMZ received: {BMZ received:30440023} Rhophylac:{Rhophylac received:30440032} OZD:{GUY:40347425} T-DaP:{Tdap:23962} Flu: {ZDG:38756} RSV Vaccine received: {RSV:31013} Transfusion:{Transfusion received:30440034} Immunizations administered: Immunization History  Administered Date(s) Administered   Influenza,inj,Quad PF,6+ Mos 02/09/2016, 01/01/2021   PFIZER(Purple Top)SARS-COV-2 Vaccination 04/19/2019, 05/10/2019   Tdap 02/26/2011, 10/26/2022    Physical exam  Vitals:   11/25/22 2024 11/26/22 0057 11/26/22 0417 11/26/22 0855  BP:  (!) 155/90 (!) 146/90 135/79  Pulse: 83 81 74 87  Resp: 18 16 16 17   Temp: 98 F (36.7 C) 98.6 F (37 C) 98.4 F (36.9 C) 97.8 F (36.6 C)  TempSrc: Oral Oral Oral Oral  SpO2:  99% 98% 97%  Weight:      Height:       General: {Exam; general:21111117} Lochia: {Desc; appropriate/inappropriate:30686::"appropriate"} Uterine Fundus: {Desc; firm/soft:30687} Incision: {Exam; incision:21111123} DVT Evaluation: {Exam; dvt:2111122} Labs: Lab Results  Component Value Date   WBC 10.1 11/24/2022   HGB 11.6 (L) 11/24/2022   HCT 36.2 11/24/2022   MCV 91.0 11/24/2022   PLT 302 11/24/2022      Latest Ref Rng & Units 11/22/2022   11:51 PM   CMP  Glucose 70 - 99 mg/dL 93   BUN 6 - 20 mg/dL 17   Creatinine 4.33 - 1.00 mg/dL 2.95   Sodium 188 - 416 mmol/L 135   Potassium 3.5 - 5.1 mmol/L 3.8   Chloride 98 - 111 mmol/L 105   CO2 22 - 32 mmol/L 22   Calcium 8.9 - 10.3 mg/dL 9.5   Total Protein 6.5 - 8.1 g/dL 5.9   Total Bilirubin 0.3 - 1.2 mg/dL 0.5   Alkaline Phos 38 - 126 U/L 69   AST 15 - 41 U/L 33   ALT 0 - 44 U/L 39    Edinburgh Score:    11/23/2022    6:54 AM  Edinburgh Postnatal Depression Scale Screening Tool  I have been able to laugh and see the funny side of things. 0  I have looked forward with enjoyment to things. 0  I have blamed myself unnecessarily when things went wrong. 1  I have been anxious or worried for no good reason. 1  I have felt scared or panicky for no good reason. 0  Things have been getting on top of me. 1  I have been so unhappy that I have had difficulty sleeping. 0  I have felt sad or miserable. 0  I have been so unhappy that I have been crying. 0  The thought of harming myself has occurred to me. 0  Edinburgh Postnatal Depression Scale Total 3      After visit meds:  Allergies as of 11/26/2022       Reactions   Lexapro [escitalopram Oxalate] Other (See  Comments)   Blunted affect   Wellbutrin [bupropion] Other (See Comments)   intolerant        Medication List     STOP taking these medications    Accu-Chek Guide test strip Generic drug: glucose blood   Accu-Chek Guide w/Device Kit   Accu-Chek Softclix Lancets lancets   aspirin EC 81 MG tablet   Dexcom G7 Sensor Misc   IRON PO   metFORMIN 500 MG tablet Commonly known as: GLUCOPHAGE       TAKE these medications    docusate sodium 100 MG capsule Commonly known as: Colace Take 1 capsule (100 mg total) by mouth 2 (two) times daily for 14 days.   furosemide 20 MG tablet Commonly known as: LASIX Take 1 tablet (20 mg total) by mouth daily for 3 days.   gabapentin 100 MG capsule Commonly known as:  NEURONTIN Take 1 capsule (100 mg total) by mouth 2 (two) times daily for 7 days.   ibuprofen 600 MG tablet Commonly known as: ADVIL Take 1 tablet (600 mg total) by mouth every 6 (six) hours as needed.   NIFEdipine 30 MG 24 hr tablet Commonly known as: ADALAT CC Take 1 tablet (30 mg total) by mouth every 12 (twelve) hours.   oxyCODONE-acetaminophen 5-325 MG tablet Commonly known as: PERCOCET/ROXICET Take 1-2 tablets by mouth every 6 (six) hours as needed.   Prenatal Vitamin Plus Low Iron 27-1 MG Tabs Take 1 tablet by mouth daily.   senna-docusate 8.6-50 MG tablet Commonly known as: Senokot-S Take 2 tablets by mouth at bedtime as needed for moderate constipation.   simethicone 80 MG chewable tablet Commonly known as: MYLICON Chew 1 tablet (80 mg total) by mouth as needed for flatulence.   TUMS PO Take by mouth as needed.   VITAMIN B-12 PO Take by mouth daily.   Vitamin D3 50 MCG (2000 UT) capsule Take 1 capsule (2,000 Units total) by mouth daily.         Discharge home in stable condition Infant Feeding: {Baby feeding:23562} Infant Disposition:{CHL IP OB HOME WITH YIRSWN:46270} Discharge instruction: per After Visit Summary and Postpartum booklet. Activity: Advance as tolerated. Pelvic rest for 6 weeks.  Diet: {OB diet:21111121} Anticipated Birth Control: {Birth Control:23956} Postpartum Appointment:{Outpatient follow up:23559} Additional Postpartum F/U: {PP Procedure:23957} Future Appointments: Future Appointments  Date Time Provider Department Center  02/08/2023 10:20 AM Tobb, [place], DO [facility] None   Follow up Visit:  Follow-up Information     [facility] for [facility]. Go on 11/30/2022.   Specialty: Obstetrics and Gynecology Why: incision and blood pressure check appointment call the office on tuesday, if you have not heard about this appointment Contact information: [address] [place]  [address] [phone]                    11/26/2022 [place] Bing, MD

## 2022-11-26 NOTE — Discharge Instructions (Signed)
Continue to check your blood pressures twice a day Call the office for blood pressures that are consistently above 150 for the top number or 100 for the bottom number   Hypertension During Pregnancy Hypertension is also called high blood pressure. High blood pressure means that the force of your blood moving in your body is too strong. It can cause problems for you and your baby. Different types of high blood pressure can happen during pregnancy. The types are: High blood pressure before you got pregnant. This is called chronic hypertension.  This can continue during your pregnancy. Your doctor will want to keep checking your blood pressure. You may need medicine to keep your blood pressure under control while you are pregnant. You will need follow-up visits after you have your baby. High blood pressure that goes up during pregnancy when it was normal before. This is called gestational hypertension. It will usually get better after you have your baby, but your doctor will need to watch your blood pressure to make sure that it is getting better. Very high blood pressure during pregnancy. This is called preeclampsia. Very high blood pressure is an emergency that needs to be checked and treated right away. You may develop very high blood pressure after giving birth. This is called postpartum preeclampsia. This usually occurs within 48 hours after childbirth but may occur up to 6 weeks after giving birth. This is rare. How does this affect me? If you have high blood pressure during pregnancy, you have a higher chance of developing high blood pressure: As you get older. If you get pregnant again. In some cases, high blood pressure during pregnancy can cause: Stroke. Heart attack. Damage to the kidneys, lungs, or liver. Preeclampsia. Jerky movements you cannot control (convulsions or seizures). Problems with the placenta.  What can I do to lower my risk?  Keep a healthy weight. Eat a healthy  diet. Follow what your doctor tells you about treating any medical problems that you had before becoming pregnant. It is very important to go to all of your doctor visits. Your doctor will check your blood pressure and make sure that your pregnancy is progressing as it should. Treatment should start early if a problem is found.  Follow these instructions at home:  Take your blood pressure 1-2 times per day. Call the office if your blood pressure is 155 or higher for the top number or 105 or higher for the bottom number.    Eating and drinking  Drink enough fluid to keep your pee (urine) pale yellow. Avoid caffeine. Lifestyle Do not use any products that contain nicotine or tobacco, such as cigarettes, e-cigarettes, and chewing tobacco. If you need help quitting, ask your doctor. Do not use alcohol or drugs. Avoid stress. Rest and get plenty of sleep. Regular exercise can help. Ask your doctor what kinds of exercise are best for you. General instructions Take over-the-counter and prescription medicines only as told by your doctor. Keep all prenatal and follow-up visits as told by your doctor. This is important. Contact a doctor if: You have symptoms that your doctor told you to watch for, such as: Headaches. Nausea. Vomiting. Belly (abdominal) pain. Dizziness. Light-headedness. Get help right away if: You have: Very bad belly pain that does not get better with treatment. A very bad headache that does not get better. Vomiting that does not get better. Sudden, fast weight gain. Sudden swelling in your hands, ankles, or face. Blood in your pee. Blurry vision. Double vision.   Shortness of breath. Chest pain. Weakness on one side of your body. Trouble talking. Summary High blood pressure is also called hypertension. High blood pressure means that the force of your blood moving in your body is too strong. High blood pressure can cause problems for you and your baby. Keep all  follow-up visits as told by your doctor. This is important. This information is not intended to replace advice given to you by your health care provider. Make sure you discuss any questions you have with your health care provider. Document Released: 02/12/2010 Document Revised: 05/03/2018 Document Reviewed: 02/06/2018 Elsevier Patient Education  2020 Elsevier Inc.  

## 2022-11-26 NOTE — Lactation Note (Signed)
This note was copied from a baby's chart.  NICU Lactation Consultation Note  Patient Name: Jessica Barron Date: 11/26/2022 Age:43 days  Reason for consult: Follow-up assessment; NICU baby; Late-preterm 34-36.6wks; Maternal endocrine disorder Type of Endocrine Disorder?: Diabetes; PCOS  SUBJECTIVE  LC in to visit with P2 Mom of baby "Jessica Barron" on day of Mom's discharge from [facility].   Mom states she hadn't pumped since last evening, but was excited to express drops.  Today, breasts are tender and lumpy.  Reassured Mom that this was a normal part of her breasts responding to hormonal changes and breast stimulation.  Discussed the importance of consistent pumping to support a full supply.  Mom hadn't pumped since yesterday, and offered to set up the pump in baby's room.    LC provided a pumping top and assisted her to pump on initiation setting.  Breasts feels normal with some developing milk ducts palpated.  Paperwork provided for [facility] loaner as Mom plans to return home this afternoon and return tonight.    OBJECTIVE Infant data: No data recorded O2 Device: Room Air  Infant feeding assessment Scale for Readiness: 2   Maternal data: G2P1102 C-Section, Low Transverse Pumping frequency: Not consistently, encouraged 8 times per 24 hrs Pumped volume: 0 mL (a few drops) Flange Size: 21 Hands-free pumping top sizes: Large Wallace Cullens)  WIC Program: Yes WIC Referral Sent?: Yes What county?: [place] (Encouraged to call WIC office) Pump: WIC Loaner (Paperwork provided for [employer] loaner)   Feeding Status: Scheduled 9-12-3-6 Feeding method: Tube/Gavage (Bolus)  Maternal: Milk volume: Normal  INTERVENTIONS/PLAN Interventions: Interventions: Breast feeding basics reviewed; Skin to skin; Breast massage; Hand express; DEBP; Education Discharge Education: Engorgement and breast care Tools: Flanges; Pump; Hands-free pumping top Pump Education: Setup, frequency, and cleaning; Milk  Storage  Plan: 1- STS with baby as much as possible 2- Massage breasts and hand express 3- Pump both breasts 8 times per 24 hrs 4- If Mom would like to breastfeed baby, to ask for [facility] assistance  Consult Status: NICU follow-up NICU Follow-up type: Verify absence of engorgement; Verify onset of copious milk   [name] 11/26/2022, 12:16 PM

## 2022-11-27 ENCOUNTER — Ambulatory Visit (HOSPITAL_COMMUNITY): Payer: Self-pay

## 2022-11-27 NOTE — Lactation Note (Signed)
This note was copied from a baby's chart.  NICU Lactation Consultation Note  Patient Name: Jessica Barron WUJWJ'X Date: 11/27/2022 Age:43 days  Reason for consult: Follow-up assessment; NICU baby; Late-preterm 34-36.6wks; Maternal endocrine disorder Type of Endocrine Disorder?: Diabetes; PCOS  SUBJECTIVE  FOB caught LC in the hallway to ask whether drinking coffee was ok with breastfeeding.  LC came into baby's room and educated on the importance of a good healthy diet and how a cup of coffee per day is fine, but drinking a lot of coffee throughout the day can have affect on baby through the breast milk. Caffiene will pass through to breast milk and came make baby jittery.  Mom states she is expressing more milk now, but still not a full supply.  Encouraged consistent pumping and lots of STS with baby.  Mom requested another pumping top in size large as XL was too large.  OBJECTIVE Infant data: No data recorded O2 Device: Room Air  Infant feeding assessment Scale for Readiness: 3 (minimal sucking on pacifier and then tongue thrusting)   Maternal data: G2P1102 C-Section, Low Transverse Pumping frequency: 8 times per 24 hrs Pumped volume: 5 mL Flange Size: 21 Hands-free pumping top sizes: Large Wallace Cullens)  WIC Program: Yes WIC Referral Sent?: Yes What county?: [place] (Encouraged to call WIC office) Pump: WIC Loaner (Paperwork provided for [employer] loaner)   Feeding Status: Scheduled 9-12-3-6 Feeding method: Tube/Gavage (Bolus)  Maternal: Milk volume: Low  INTERVENTIONS/PLAN Interventions: Interventions: Breast feeding basics reviewed; Skin to skin; Breast massage; Hand express; DEBP; Education Discharge Education: Engorgement and breast care Tools: Pump; Flanges; Hands-free pumping top Pump Education: Setup, frequency, and cleaning; Milk Storage  Plan: Consult Status: NICU follow-up NICU Follow-up type: Verify absence of engorgement; Verify onset of copious  milk   Jessica Barron 11/27/2022, 2:49 PM

## 2022-11-28 ENCOUNTER — Encounter: Payer: Medicaid Other | Admitting: Family Medicine

## 2022-11-28 ENCOUNTER — Ambulatory Visit: Payer: Medicaid Other

## 2022-11-29 DIAGNOSIS — O165 Unspecified maternal hypertension, complicating the puerperium: Secondary | ICD-10-CM | POA: Diagnosis not present

## 2022-11-30 ENCOUNTER — Ambulatory Visit: Payer: Medicaid Other

## 2022-12-01 ENCOUNTER — Ambulatory Visit: Payer: Medicaid Other | Admitting: *Deleted

## 2022-12-01 ENCOUNTER — Other Ambulatory Visit: Payer: Self-pay

## 2022-12-01 VITALS — BP 132/81 | HR 98 | Temp 99.1°F | Ht 62.0 in | Wt 243.7 lb

## 2022-12-01 DIAGNOSIS — R609 Edema, unspecified: Secondary | ICD-10-CM

## 2022-12-01 DIAGNOSIS — Z0130 Encounter for examination of blood pressure without abnormal findings: Secondary | ICD-10-CM

## 2022-12-01 DIAGNOSIS — Z5189 Encounter for other specified aftercare: Secondary | ICD-10-CM

## 2022-12-01 MED ORDER — FUROSEMIDE 20 MG PO TABS: 20.0000 mg | ORAL_TABLET | Freq: Every day | ORAL | 0 refills | Status: DC

## 2022-12-01 NOTE — Progress Notes (Signed)
Here for BP check and Incision check s/p repeat C/S and BTL 11/23/22 at 34.2 weeks with GDMA2. Diagnosed with postpartum HTN and d/c 11/29/22 on Nifedipine 30 XL BID. Today BP 132/81. C/o noticed a rash on her trunk on Monday , thinks it is the Gabapentin and stopped that and rash has lessened and is barely visible today.  C/o some nausea and lost of appetite. Has 1+ edema to legs/ feet. C/o headache . Incision CDI and rosy pink. Dr. Earlene Plater in to assess wound, and disussed symptoms and assessment. Advised continue Procardia and 1 week bp / incision check. Also advised RX for 3 more days of lasix. I also reviewed wound care and signs of pre-eclampsia. Patient voices understanding. Nancy Fetter

## 2022-12-02 ENCOUNTER — Ambulatory Visit (HOSPITAL_COMMUNITY): Payer: Self-pay

## 2022-12-02 NOTE — Lactation Note (Signed)
This note was copied from a baby's chart.  NICU Lactation Consultation Note  Patient Name: Jessica Barron [id number]'O Date: 12/02/2022 Age:43 days  Reason for consult: Follow-up assessment; NICU baby; Late-preterm 34-36.6wks; Maternal endocrine disorder Type of Endocrine Disorder?: Diabetes; PCOS  SUBJECTIVE  LC in to visit with P2 Mom of baby "Jessica Barron" in the NICU.  Baby is 36 wk PMA and is now taking bottles, up to 71% po   Mom has been working on her milk supply.  She had questions regarding her medication and breastfeeding.  She is now off Oxycodone and taking ibuprofen and is getting relief.  She was prescribed Lasix but hasn't taken it yet as she noticed her swelling has decreased.  She plans to call her MD.  She is also on Nifedipine and knows that is safe at L2.  LC asked Mom if she would like help breastfeeding "Jessica Barron" and she said maybe tomorrow.  Encouraged her to ask her baby's RN to call lactation.  Explained how STS and baby latching to the breast can boost her supply, especially if she pumps both her breasts after breastfeeding or attempts.  OBJECTIVE Infant data: No data recorded O2 Device: Room Air  Infant feeding assessment Scale for Readiness: 1 Scale for Quality: 2   Maternal data: G2P1102 C-Section, Low Transverse Pumping frequency: every 3 hrs, but not at night Pumped volume: 10 mL Flange Size: 21 Hands-free pumping top sizes: Large Wallace Cullens)  WIC Program: Yes WIC Referral Sent?: Yes What county?: [place] (Encouraged to call WIC office) Pump: WIC Loaner (Paperwork provided for [employer] loaner)   Feeding Status: Scheduled 8-11-2-5 Feeding method: Bottle Nipple Type: Dr. Levert Feinstein Preemie  Maternal: Milk volume: Low  INTERVENTIONS/PLAN Interventions: Interventions: Skin to skin; Breast massage; Hand express; DEBP; Education Tools: Flanges; Pump; Hands-free pumping top Pump Education: Setup, frequency, and cleaning; Milk Storage  Plan: Consult  Status: NICU follow-up NICU Follow-up type: Verify onset of copious milk; Verify absence of engorgement   Jessica Barron 12/02/2022, 6:56 PM

## 2022-12-04 ENCOUNTER — Ambulatory Visit (HOSPITAL_COMMUNITY): Payer: Self-pay

## 2022-12-04 NOTE — Lactation Note (Signed)
This note was copied from a baby's chart.  NICU Lactation Consultation Note  Patient Name: Jessica Barron [id number]'A Date: 12/04/2022 Age:43 days  Reason for consult: Weekly NICU follow-up; NICU baby; Maternal endocrine disorder; Other (Comment); Late-preterm 34-36.6wks (Raynaud's syndrome, AMA) Type of Endocrine Disorder?: Diabetes; PCOS (GDMA2 (metformin))  SUBJECTIVE Visited with family of 52 43/14 weeks old AGA NICU female; Ms. Dauphin is a P2 and reports she's been pumping consistently during the day but it's been challenging to keep up at night. She hasn't taken baby "Marcy Salvo" to breast while at the hospital, family has been focusing in pumping and bottle feeding to try to get baby home, he's getting discharged today. Reviewed discharge education and the importance of pumping after feedings/attempts at the breast to protect her supply. Remind her to pump whenever baby is getting a bottle to try to be in "synch" with baby's feedings. She politely declined a referral for LC OP as she already has a friend who works in Doctor, hospital and will be following up with her. FOB present and supportive. All questions and concerns answered, family to contact [facility] services PRN.  OBJECTIVE Infant data: Mother's Current Feeding Choice: Breast Milk and Formula  O2 Device: Room Air  Infant feeding assessment Scale for Readiness: 1 Scale for Quality: 1   Maternal data: G2P1102 C-Section, Low Transverse Pumping frequency: 4-6 times/24 hours Pumped volume: 10 mL Flange Size: 21 Hands-free pumping top sizes: Large Wallace Cullens)  WIC Program: Yes WIC Referral Sent?: Yes What county?: [place] (She has an appt with the [facility] office on 12/05/22 to pick up her pump) Pump: WIC Loaner  ASSESSMENT Infant: Feeding Status: Ad lib Feeding method: Bottle Nipple Type: Dr. Irving Burton Preemie  Maternal: Milk volume: Low  INTERVENTIONS/PLAN Interventions: Interventions: Breast feeding basics reviewed; Coconut oil; DEBP;  Education Discharge Education: Outpatient recommendation Tools: Pump; Flanges; Hands-free pumping top; Coconut oil Pump Education: Setup, frequency, and cleaning; Milk Storage  Plan: Consult Status: Complete   Jeury Mcnab S Yalda Herd 12/04/2022, 3:32 PM

## 2022-12-05 ENCOUNTER — Encounter: Payer: Medicaid Other | Admitting: Obstetrics and Gynecology

## 2022-12-05 ENCOUNTER — Ambulatory Visit: Payer: Medicaid Other

## 2022-12-08 ENCOUNTER — Ambulatory Visit: Payer: Medicaid Other

## 2022-12-13 ENCOUNTER — Ambulatory Visit: Payer: Medicaid Other

## 2022-12-13 VITALS — BP 121/73 | HR 73

## 2022-12-13 DIAGNOSIS — Z5189 Encounter for other specified aftercare: Secondary | ICD-10-CM

## 2022-12-13 NOTE — Progress Notes (Signed)
Pt here today with concerns with incision.  Pt husband reports noticing two red spots around the incision.  Pt states that she is having pain but when she moves in certain positions.  Pt endorses taking ibuprofen is effective pain management.  Incision observed to well approximated with some redness along the incision line.  Small area on left edge of incision open about 3-4 mm when palpated clear, odorless drainage is seen.  Pt denies fever reports 97.9 oral temp.  Cresenzo, MD at bedside- applied silver nitrate. Pt advised to continue to allow soapy water to run over and to keep area dry.  Pt encouraged to call the office with concerns.  Pt verbalized understanding with no further questions.   Leonette Nutting

## 2022-12-15 ENCOUNTER — Encounter: Payer: Self-pay | Admitting: Family Medicine

## 2022-12-27 ENCOUNTER — Ambulatory Visit: Payer: Medicaid Other

## 2022-12-27 ENCOUNTER — Other Ambulatory Visit: Payer: Self-pay

## 2022-12-27 VITALS — BP 134/82 | HR 89

## 2022-12-27 DIAGNOSIS — Z5189 Encounter for other specified aftercare: Secondary | ICD-10-CM

## 2022-12-27 NOTE — Progress Notes (Signed)
Incision Check Visit  Jessica Barron is here for incision check following repeat c-section on 11/23/22. Patient reports incision appears darker than previously and continues to drain; drainage varies from red/pink to yellow/clear. Reports mild pain at incision site that feels like pulling, not taking any PRN pain med. Cresenzo, MD to bedside to assess incision and apply silver nitrate.  History of pre-eclampsia with prior pregnancy and current diagnosis of postpartum hypertension. Started on nifedipine after birth but no longer taking. BP today is 134/82. Pt continues to monitor at home. Will follow up at [facility] visit on 01/02/23.  Marjo Bicker, RN 12/27/2022  4:17 PM

## 2022-12-29 ENCOUNTER — Ambulatory Visit: Payer: Medicaid Other | Admitting: Obstetrics and Gynecology

## 2022-12-30 ENCOUNTER — Inpatient Hospital Stay (HOSPITAL_COMMUNITY): Admit: 2022-12-30 | Payer: Medicaid Other

## 2023-01-02 ENCOUNTER — Ambulatory Visit: Payer: Medicaid Other | Admitting: Family Medicine

## 2023-01-19 ENCOUNTER — Ambulatory Visit: Payer: Medicaid Other | Admitting: Obstetrics and Gynecology

## 2023-02-01 ENCOUNTER — Encounter: Payer: Self-pay | Admitting: Obstetrics and Gynecology

## 2023-02-01 ENCOUNTER — Ambulatory Visit: Payer: Medicaid Other | Admitting: Obstetrics and Gynecology

## 2023-02-01 DIAGNOSIS — N920 Excessive and frequent menstruation with regular cycle: Secondary | ICD-10-CM | POA: Diagnosis not present

## 2023-02-01 DIAGNOSIS — Z1231 Encounter for screening mammogram for malignant neoplasm of breast: Secondary | ICD-10-CM | POA: Diagnosis not present

## 2023-02-01 DIAGNOSIS — E282 Polycystic ovarian syndrome: Secondary | ICD-10-CM | POA: Diagnosis not present

## 2023-02-01 MED ORDER — TRANEXAMIC ACID 650 MG PO TABS: 1300.0000 mg | ORAL_TABLET | Freq: Three times a day (TID) | ORAL | 6 refills | Status: DC

## 2023-02-01 NOTE — Progress Notes (Signed)
 Post Partum Visit Note  Jessica Barron is a 43 y.o. G46P1102 female who presents for a postpartum visit. She is 10 weeks postpartum following a repeat cesarean section.  I have fully reviewed the prenatal and intrapartum course. The delivery was at [redacted]w[redacted]d gestational weeks.  Anesthesia: spinal. Postpartum course has been uncomplicated. Baby is doing well. Baby is feeding by bottle - Enfamil Neuropro Gentleease . Bleeding staining only. Bowel function is normal. Bladder function is normal. Patient is sexually active. Contraception method is tubal ligation. Postpartum depression screening: negative.  Periods are regular but heavy. Has h/o PCOS.     Upstream - 02/01/23 1434       Pregnancy Intention Screening   Does the patient want to become pregnant in the next year? No    Does the patient's partner want to become pregnant in the next year? Yes    Would the patient like to discuss contraceptive options today? No      Contraception Wrap Up   Current Method Female Sterilization    End Method Female Sterilization    Contraception Counseling Provided No            The pregnancy intention screening data noted above was reviewed. Potential methods of contraception were discussed. The patient elected to proceed with Female Sterilization.   Edinburgh Postnatal Depression Scale - 02/01/23 1430       Edinburgh Postnatal Depression Scale:  In the Past 7 Days   I have been able to laugh and see the funny side of things. 0    I have looked forward with enjoyment to things. 0    I have blamed myself unnecessarily when things went wrong. 0    I have been anxious or worried for no good reason. 0    I have felt scared or panicky for no good reason. 1    Things have been getting on top of me. 1    I have been so unhappy that I have had difficulty sleeping. 0    I have felt sad or miserable. 0    I have been so unhappy that I have been crying. 0    The thought of harming myself has occurred  to me. 0    Edinburgh Postnatal Depression Scale Total 2             Health Maintenance Due  Topic Date Due   INFLUENZA VACCINE  08/25/2022    The following portions of the patient's history were reviewed and updated as appropriate: allergies, current medications, past family history, past medical history, past social history, past surgical history, and problem list.  Review of Systems Pertinent items are noted in HPI.  Objective:  BP 120/80   Pulse 85   Wt 244 lb (110.7 kg)   Breastfeeding No   BMI 44.63 kg/m    General:  alert, cooperative, and no distress  Lungs: clear to auscultation bilaterally  Heart:  regular rate and rhythm, S1, S2 normal, no murmur, click, rub or gallop  Abdomen: soft, non-tender; bowel sounds normal; no masses,  no organomegaly   Wound well approximated incision  GU exam:  not indicated       Assessment:  Postpartum exam.   Plan:   Essential components of care per ACOG recommendations:  1.  Mood and well being: Patient with negative depression screening today. Reviewed local resources for support.  - Patient tobacco use? No.   - hx of drug use? No.  2. Infant care and feeding:  -Patient currently breastmilk feeding? No.  -Social determinants of health (SDOH) reviewed in EPIC. No concerns  3. Sexuality, contraception and birth spacing - Patient does not want a pregnancy in the next year.  Desired family size is 2 children.  - Reviewed reproductive life planning. Reviewed contraceptive methods based on pt preferences and effectiveness.  Patient s/p BTS  4. Sleep and fatigue -Encouraged family/partner/community support of 4 hrs of uninterrupted sleep to help with mood and fatigue  5. Physical Recovery  - Discussed patients delivery and complications. She describes her labor as good. - Patient had a C-section.  - Patient has urinary incontinence? No. - Patient is safe to resume physical and sexual activity  6.  Health  Maintenance - HM due items addressed Yes - Last pap smear  Diagnosis  Date Value Ref Range Status  06/21/2022   Final   - Negative for intraepithelial lesion or malignancy (NILM)   Pap smear not done at today's visit.  -Breast Cancer screening indicated? Yes. Patient referred today for mammogram. Will schedule 6 months out from nursing.   7. Chronic Disease/Pregnancy Condition follow up: Gestational Diabetes - PCP follow up  Vina Solian, MD [facility] for [facility], [facility]

## 2023-02-08 ENCOUNTER — Ambulatory Visit: Payer: Medicaid Other | Attending: Cardiology | Admitting: Cardiology

## 2023-02-17 ENCOUNTER — Other Ambulatory Visit: Payer: Self-pay | Admitting: Family Medicine

## 2023-02-17 ENCOUNTER — Other Ambulatory Visit: Payer: Medicaid Other

## 2023-02-17 DIAGNOSIS — E785 Hyperlipidemia, unspecified: Secondary | ICD-10-CM

## 2023-02-17 DIAGNOSIS — R739 Hyperglycemia, unspecified: Secondary | ICD-10-CM

## 2023-02-17 DIAGNOSIS — Z8639 Personal history of other endocrine, nutritional and metabolic disease: Secondary | ICD-10-CM

## 2023-02-22 ENCOUNTER — Other Ambulatory Visit (INDEPENDENT_AMBULATORY_CARE_PROVIDER_SITE_OTHER): Payer: Medicaid Other

## 2023-02-22 DIAGNOSIS — E785 Hyperlipidemia, unspecified: Secondary | ICD-10-CM | POA: Diagnosis not present

## 2023-02-22 DIAGNOSIS — R739 Hyperglycemia, unspecified: Secondary | ICD-10-CM | POA: Diagnosis not present

## 2023-02-22 DIAGNOSIS — Z8639 Personal history of other endocrine, nutritional and metabolic disease: Secondary | ICD-10-CM | POA: Diagnosis not present

## 2023-02-22 LAB — LIPID PANEL
Cholesterol: 259 mg/dL — ABNORMAL HIGH (ref 0–200)
HDL: 55.5 mg/dL (ref >39.00)
LDL Cholesterol: 165 mg/dL — ABNORMAL HIGH (ref 0–99)
NonHDL: 203.53
Total CHOL/HDL Ratio: 5
Triglycerides: 194 mg/dL — ABNORMAL HIGH (ref 0.0–149.0)
VLDL: 38.8 mg/dL (ref 0.0–40.0)

## 2023-02-22 LAB — COMPREHENSIVE METABOLIC PANEL
ALT: 17 U/L (ref 0–35)
AST: 14 U/L (ref 0–37)
Albumin: 4.3 g/dL (ref 3.5–5.2)
Alkaline Phosphatase: 51 U/L (ref 39–117)
BUN: 13 mg/dL (ref 6–23)
CO2: 30 meq/L (ref 19–32)
Calcium: 9.9 mg/dL (ref 8.4–10.5)
Chloride: 102 meq/L (ref 96–112)
Creatinine, Ser: 0.71 mg/dL (ref 0.40–1.20)
GFR: 104.15 mL/min (ref >60.00)
Glucose, Bld: 99 mg/dL (ref 70–99)
Potassium: 4.5 meq/L (ref 3.5–5.1)
Sodium: 140 meq/L (ref 135–145)
Total Bilirubin: 0.3 mg/dL (ref 0.2–1.2)
Total Protein: 6.9 g/dL (ref 6.0–8.3)

## 2023-02-22 LAB — CBC WITH DIFFERENTIAL/PLATELET
Basophils Absolute: 0 10*3/uL (ref 0.0–0.1)
Basophils Relative: 0.4 % (ref 0.0–3.0)
Eosinophils Absolute: 0.1 10*3/uL (ref 0.0–0.7)
Eosinophils Relative: 1.4 % (ref 0.0–5.0)
HCT: 40.3 % (ref 36.0–46.0)
Hemoglobin: 13 g/dL (ref 12.0–15.0)
Lymphocytes Relative: 21.7 % (ref 12.0–46.0)
Lymphs Abs: 2 10*3/uL (ref 0.7–4.0)
MCHC: 32.3 g/dL (ref 30.0–36.0)
MCV: 88.6 fL (ref 78.0–100.0)
Monocytes Absolute: 0.5 10*3/uL (ref 0.1–1.0)
Monocytes Relative: 5.4 % (ref 3.0–12.0)
Neutro Abs: 6.5 10*3/uL (ref 1.4–7.7)
Neutrophils Relative %: 71.1 % (ref 43.0–77.0)
Platelets: 365 10*3/uL (ref 150.0–400.0)
RBC: 4.54 Mil/uL (ref 3.87–5.11)
RDW: 14.2 % (ref 11.5–15.5)
WBC: 9.2 10*3/uL (ref 4.0–10.5)

## 2023-02-22 LAB — TSH: TSH: 2.18 u[IU]/mL (ref 0.35–5.50)

## 2023-02-22 LAB — HEMOGLOBIN A1C: Hgb A1c MFr Bld: 5.6 % (ref 4.6–6.5)

## 2023-02-22 LAB — VITAMIN D 25 HYDROXY (VIT D DEFICIENCY, FRACTURES): VITD: 31.59 ng/mL (ref 30.00–100.00)

## 2023-02-24 ENCOUNTER — Ambulatory Visit (INDEPENDENT_AMBULATORY_CARE_PROVIDER_SITE_OTHER): Payer: Medicaid Other | Admitting: Family Medicine

## 2023-02-24 ENCOUNTER — Encounter: Payer: Self-pay | Admitting: Family Medicine

## 2023-02-24 VITALS — BP 128/64 | HR 91 | Temp 98.4°F | Ht 62.0 in | Wt 249.2 lb

## 2023-02-24 DIAGNOSIS — Z8632 Personal history of gestational diabetes: Secondary | ICD-10-CM | POA: Diagnosis not present

## 2023-02-24 DIAGNOSIS — E282 Polycystic ovarian syndrome: Secondary | ICD-10-CM | POA: Diagnosis not present

## 2023-02-24 DIAGNOSIS — E785 Hyperlipidemia, unspecified: Secondary | ICD-10-CM

## 2023-02-24 NOTE — Progress Notes (Unsigned)
Son Jessica Barron is 68 months old now.  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  She is getting some sleep.    F/u re: GDM.  A1c wnl.  HGB normalized.  TSH wnl.  Vit D wnl.  Lipids elevated- she had eaten prior to labs.  She isn't having abnormal low sugars now.  Prev noted on dexcom.  D/w pt.    She was back on spironolactone and felt better but then stopped med in the meantime. D/w pt about restart.    D/w pt about restarting vit D.   Meds, vitals, and allergies reviewed.   ROS: Per HPI unless specifically indicated in ROS section   The 10-year ASCVD risk score (Arnett DK, et al., 2019) is: 2.1%   Values used to calculate the score:     Age: 43 years     Sex: Female     Is Non-Hispanic African American: No     Diabetic: Yes     Tobacco smoker: No     Systolic Blood Pressure: 128 mmHg     Is BP treated: No     HDL Cholesterol: 55.5 mg/dL     Total Cholesterol: 259 mg/dL

## 2023-02-24 NOTE — Patient Instructions (Addendum)
Restart spironolactone 1/2 tab a day for 1 week then 1 tab daily.  Take care.  Glad to see you. Recheck lipids this summer.

## 2023-02-26 ENCOUNTER — Encounter: Payer: Self-pay | Admitting: Family Medicine

## 2023-02-26 NOTE — Assessment & Plan Note (Signed)
Restart spironolactone 1/2 tab a day for 1 week then 1 tab daily.  Update me as needed.

## 2023-02-26 NOTE — Assessment & Plan Note (Signed)
Resolved, not diabetic, d/w pt about diet and exercise.

## 2023-02-26 NOTE — Assessment & Plan Note (Signed)
No statin indication now.  Continue work on diet and exercise.  Recheck lipids this summer.

## 2023-03-03 ENCOUNTER — Ambulatory Visit: Payer: Self-pay | Admitting: Family Medicine

## 2023-03-03 NOTE — Telephone Encounter (Signed)
  Chief Complaint: L foot pain Symptoms: pain Frequency: weeks Pertinent Negatives: Patient denies injury, fever, DM, new footwear.   Disposition: [] ED /[] Urgent Care (no appt availability in office) / [x] Appointment(In office/virtual)/ []  [place] [facility]/ [] Home Care/ [] Refused Recommended Disposition /[] [place] [facility]/ []  Follow-up with PCP  Additional Notes: Pt states this has been an ongoing issues since atleast Jan, potentially earlier. Per pt she states that does not remember any injury that occurred. Pt states that the pain fluctuates from 2 at times to 7 at other times. Pt states that at times it impacts her ability to walk, but mostly she can put weight on the foot. Pt states that she has not tried any new footwear. Pt denies numbness/tingling. States she is not diabetic. Pt declined earlier appts with other providers in pcp clinic states that she would like to see her PCP if possible. Appt sched for 2/13. Pt given care advise per Epic.   Copied from CRM [id number]. Topic: Clinical - Red Word Triage >> Mar 03, 2023  9:24 AM Joanell NOVAK wrote: Red Word that prompted transfer to Nurse Triage: Pt husband Dorise called and stated that the pt foot could be broken or fractured. Certain movements causes it to hurt. Reason for Disposition  [1] MODERATE pain (e.g., interferes with normal activities, limping) AND [2] present > 3 days  Answer Assessment - Initial Assessment Questions 1. ONSET: When did the pain start?      weeks 2. LOCATION: Where is the pain located?      Top of foot, to sole of foot, L 3. PAIN: How bad is the pain?    (Scale 1-10; or mild, moderate, severe)  - MILD (1-3): doesn't interfere with normal activities.   - MODERATE (4-7): interferes with normal activities (e.g., work or school) or awakens from sleep, limping.   - SEVERE (8-10): excruciating pain, unable to do any normal activities, unable to walk.      2-7 4. WORK OR EXERCISE: Has there been any  recent work or exercise that involved this part of the body?      denies 5. CAUSE: What do you think is causing the foot pain?     No idea 6. OTHER SYMPTOMS: Do you have any other symptoms? (e.g., leg pain, rash, fever, numbness)     denies 7. PREGNANCY: Is there any chance you are pregnant? When was your last menstrual period?     denies  Protocols used: Foot Pain-A-AH

## 2023-03-05 NOTE — Telephone Encounter (Signed)
 Noted. Thanks.

## 2023-03-09 ENCOUNTER — Ambulatory Visit: Payer: Medicaid Other | Admitting: Family Medicine

## 2023-03-09 ENCOUNTER — Encounter: Payer: Self-pay | Admitting: Family Medicine

## 2023-03-09 VITALS — BP 132/84 | HR 91 | Temp 98.0°F | Ht 62.0 in | Wt 249.6 lb

## 2023-03-09 DIAGNOSIS — M79673 Pain in unspecified foot: Secondary | ICD-10-CM

## 2023-03-09 DIAGNOSIS — M79646 Pain in unspecified finger(s): Secondary | ICD-10-CM | POA: Diagnosis not present

## 2023-03-09 MED ORDER — DICLOFENAC SODIUM 1 % EX GEL: 2.0000 g | Freq: Three times a day (TID) | CUTANEOUS | 1 refills | Status: DC | PRN

## 2023-03-09 NOTE — Progress Notes (Signed)
She was getting in the [place] and her the door closed on her hand.  She yanked her hand out but the R5th is sore.  This was last month.  Iced it at the time.  Sore at the L 5th PIP.  ROM intact.  No bruising.    L foot pain.  Started a few weeks ago.  Pain dorsal and plantar midfoot, lateral side. No specific injury.  No bruising.  Noted when she is sitting on her foot on the floor playing with her children, with her foot in exaggerated plantarflexion.   Meds, vitals, and allergies reviewed.   ROS: Per HPI unless specifically indicated in ROS section   Nad Ncat Left foot exam-ankle stable.  Medial and lateral malleolus are nontender.  Intact dorsalis pedis pulse.  No tendon deficit for plantar or dorsal flexion but she does have tenderness along the lateral dorsal tendon sheath on the left foot. Right fifth finger with normal flexion and extension without bruising.  Neurovascularly intact but sore near the PIP.  Hand with normal range of motion and sensation otherwise.  Not tender to palpation otherwise.

## 2023-03-09 NOTE — Patient Instructions (Signed)
Try a finger splint and diclofenac gel as needed.    You could try a post op shoe or a thick soled shoe.  Ice as needed.  Try diclofenac gel as needed.    Take care.  Glad to see you.

## 2023-03-12 DIAGNOSIS — M79673 Pain in unspecified foot: Secondary | ICD-10-CM | POA: Insufficient documentation

## 2023-03-12 DIAGNOSIS — M79646 Pain in unspecified finger(s): Secondary | ICD-10-CM | POA: Insufficient documentation

## 2023-03-12 NOTE — Assessment & Plan Note (Addendum)
Given the duration, reasonable to try a finger splint and diclofenac gel as needed.  Removable finger splint applied at office visit with routine instructions.

## 2023-03-12 NOTE — Assessment & Plan Note (Signed)
Likely a tendon strain.  She could try a post op shoe or a thick soled shoe.  Ice as needed.  Try diclofenac gel as needed.

## 2023-06-09 ENCOUNTER — Encounter: Payer: Self-pay | Admitting: Family Medicine

## 2023-07-03 ENCOUNTER — Other Ambulatory Visit: Payer: Self-pay | Admitting: Family Medicine

## 2023-07-11 ENCOUNTER — Ambulatory Visit: Admitting: Cardiology

## 2023-07-24 ENCOUNTER — Other Ambulatory Visit: Payer: Medicaid Other

## 2023-08-11 ENCOUNTER — Encounter: Payer: Self-pay | Admitting: Emergency Medicine

## 2023-08-11 ENCOUNTER — Telehealth: Payer: Self-pay | Admitting: Family Medicine

## 2023-08-11 ENCOUNTER — Ambulatory Visit
Admission: EM | Admit: 2023-08-11 | Discharge: 2023-08-11 | Disposition: A | Discharge status: Home / Self Care | Attending: Emergency Medicine | Admitting: Emergency Medicine

## 2023-08-11 DIAGNOSIS — L0100 Impetigo, unspecified: Secondary | ICD-10-CM | POA: Diagnosis not present

## 2023-08-11 MED ORDER — CEPHALEXIN 500 MG PO CAPS: 500.0000 mg | ORAL_CAPSULE | Freq: Four times a day (QID) | ORAL | 0 refills | Status: DC

## 2023-08-11 NOTE — Telephone Encounter (Signed)
 Copied from CRM [id number]. Topic: Appointments - Appointment Scheduling >> Aug 11, 2023  2:19 PM Berneda FALCON wrote: Patient states that she believes her and son Gary Feller) both have impetigo based on their symptoms. She would like a VV today if at all possible. I did not see any options for her and offered her some appts for next week which she declined. Is there any way we could get her in today by chance? And possibly her son (8 months) for a VV?    Patient callback is [phone]

## 2023-08-11 NOTE — Discharge Instructions (Signed)
 Today you are being treated for skin rash  Take cephalexin every 6 hours for 5 days  Avoid long exposure to heat as this will cause further irritation to the skin  For itching May use allergy medicine such as Claritin or Zyrtec, may also apply topical medicine such as Benadryl  cream and calamine lotion  If symptoms continue to persist please follow-up for reevaluation  Clean all high surface areas within your home to prevent further spread

## 2023-08-11 NOTE — ED Provider Notes (Signed)
 [facility]    CSN: [id number] Arrival date & time: 08/11/23  1659      History   Chief Complaint Chief Complaint  Patient presents with   Rash    HPI Jessica Barron is a 43 y.o. female.   Presents for evaluation of a erythematous pruritic rash that is draining and oozing present to the left forearm beginning 5 days ago.  Unknown cause.  Denies changes in toiletries, diet or recent travel.  Infant child has rash that has been present for 2 weeks and older child has rash that started 1 day ago, older child did have exposure to impetigo.  Denies fever.  Has attempted use of over-the-counter poison ivy medication which has been ineffective.  Past Medical History:  Diagnosis Date   Abnormal Pap smear    repeat WNL   Constipation 02/18/2018   Depression    FH: bladder cancer 02/18/2018   GERD (gastroesophageal reflux disease)    Hip pain 01/03/2021   History of PCOS    Hyperlipidemia 02/11/2016   Menorrhagia with regular cycle 01/26/2022   PCOS (polycystic ovarian syndrome) 09/11/2013   Perirectal abscess 07/19/2022   Raynaud's phenomenon 01/03/2021   Rosacea 01/01/2021   Supervision of high risk pregnancy, antepartum 05/18/2022          NURSING   PROVIDER  Office Location  [place]  Dating by  LMP c/w U/S at 7.3 wks  [facility] Model  Traditional  Anatomy U/S     Initiated care at   [employer]   English                 LAB RESULTS   Support Person  FOB-[facility]ryan  Genetics  NIPS: LR M            AFP:    wnl                NT/IT (FT only)           Carrier Screen  Horizon: neg x 4  Rhogam   O/Positive/-- (05/28    Patient Active Problem List   Diagnosis Date Noted   Finger pain 03/12/2023   Foot pain 03/12/2023   Postpartum hypertension 11/29/2022   History of gestational diabetes 11/07/2022   HLD (hyperlipidemia) 02/11/2016   PCOS (polycystic ovarian syndrome) 09/11/2013    Past Surgical History:  Procedure Laterality Date   CESAREAN SECTION   02/25/2011   Procedure: CESAREAN SECTION;  Surgeon: Lynwood FORBES Curlene PONCE, MD;  Location: [facility] ORS;  Service: Gynecology;  Laterality: N/A;   CESAREAN SECTION WITH BILATERAL TUBAL LIGATION N/A 11/23/2022   Procedure: CESAREAN SECTION WITH BILATERAL TUBAL LIGATION;  Surgeon: Cleatus Moccasin, MD;  Location: [facility] LD ORS;  Service: Obstetrics;  Laterality: N/A;   CHOLECYSTECTOMY      OB History     Gravida  2   Para  2   Term  1   Preterm  1   AB  0   Living  2      SAB  0   IAB  0   Ectopic  0   Multiple  0   Live Births  2            Home Medications    Prior to Admission medications   Medication Sig Start Date End Date Taking? Authorizing Provider  cephALEXin (KEFLEX) 500 MG capsule  Take 1 capsule (500 mg total) by mouth 4 (four) times daily. 08/11/23  Yes Oreoluwa Gilmer R, NP  Cholecalciferol  (VITAMIN D ) 50 MCG (2000 UT) CAPS Take by mouth.    [provider]  diclofenac  Sodium (VOLTAREN ) 1 % GEL Apply 2 g topically 3 (three) times daily as needed. 03/09/23   Cleatus Arlyss RAMAN, MD  spironolactone  (ALDACTONE ) 50 MG tablet Take 50 mg by mouth daily. 01/21/23   [provider]    Family History Family History  Problem Relation Age of Onset   Bladder Cancer Mother    COPD Mother    Cancer Mother        bladder   Bladder Cancer Father    Cancer Father        bladder   Breast cancer Paternal Grandmother    Anesthesia problems Neg Hx    Colon cancer Neg Hx     Social History Social History   Tobacco Use   Smoking status: Never    Passive exposure: Past   Smokeless tobacco: Never  Vaping Use   Vaping status: Never Used  Substance Use Topics   Alcohol use: Not Currently    Comment: rare   Drug use: No     Allergies   Lexapro  [escitalopram  oxalate] and Wellbutrin  [bupropion ]   Review of Systems Review of Systems  Skin:  Positive for rash.     Physical Exam Triage Vital Signs ED Triage Vitals  Encounter Vitals Group     BP  08/11/23 1713 130/82     Girls Systolic BP Percentile --      Girls Diastolic BP Percentile --      Boys Systolic BP Percentile --      Boys Diastolic BP Percentile --      Pulse Rate 08/11/23 1713 (!) 101     Resp 08/11/23 1713 20     Temp 08/11/23 1713 98.7 F (37.1 C)     Temp Source 08/11/23 1713 Oral     SpO2 08/11/23 1713 98 %     Weight --      Height --      Head Circumference --      Peak Flow --      Pain Score 08/11/23 1718 0     Pain Loc --      Pain Education --      Exclude from Growth Chart --    No data found.  Updated Vital Signs BP 130/82 (BP Location: Left Arm)   Pulse (!) 101   Temp 98.7 F (37.1 C) (Oral)   Resp 20   LMP 07/25/2023 (Approximate)   SpO2 98%   Breastfeeding No   Visual Acuity Right Eye Distance:   Left Eye Distance:   Bilateral Distance:    Right Eye Near:   Left Eye Near:    Bilateral Near:     Physical Exam Constitutional:      Appearance: Normal appearance.  Eyes:     Extraocular Movements: Extraocular movements intact.  Pulmonary:     Effort: Pulmonary effort is normal.  Skin:    Comments: Erythematous blistering rash with scant yellow drainage present to the left forearm  Neurological:     Mental Status: She is alert and oriented to person, place, and time.      UC Treatments / Results  Labs (all labs ordered are listed, but only abnormal results are displayed) Labs Reviewed - No data to display  EKG   Radiology No results  found.  Procedures Procedures (including critical care time)  Medications Ordered in UC Medications - No data to display  Initial Impression / Assessment and Plan / UC Course  I have reviewed the triage vital signs and the nursing notes.  Pertinent labs & imaging results that were available during my care of the patient were reviewed by me and considered in my medical decision making (see chart for details).  Impetigo  Presentation concerning for infection as there is drainage  on exam, has scant yellow crusting, prescribed cephalexin and recommended supportive care with follow-up if symptoms persist Final Clinical Impressions(s) / UC Diagnoses   Final diagnoses:  Impetigo     Discharge Instructions      Today you are being treated for skin rash  Take cephalexin every 6 hours for 5 days  Avoid long exposure to heat as this will cause further irritation to the skin  For itching May use allergy medicine such as Claritin or Zyrtec, may also apply topical medicine such as Benadryl  cream and calamine lotion  If symptoms continue to persist please follow-up for reevaluation  Clean all high surface areas within your home to prevent further spread   ED Prescriptions     Medication Sig Dispense Auth. Provider   cephALEXin (KEFLEX) 500 MG capsule Take 1 capsule (500 mg total) by mouth 4 (four) times daily. 20 capsule Tuwanna Krausz R, NP      PDMP not reviewed this encounter.   Teresa Shelba SAUNDERS, NP 08/11/23 1800

## 2023-08-11 NOTE — ED Triage Notes (Signed)
 Patient complains of rash that started Saturday. Reports that her daughter stepsister was diagnosis with  Impetigo today. Reports daughter just return home yesterday.

## 2023-08-14 NOTE — Telephone Encounter (Signed)
 Please reach out to patient to schedule with someone else in the clinic or urgent care. Dr. Cleatus is not in office.

## 2023-09-20 ENCOUNTER — Ambulatory Visit: Payer: Self-pay

## 2023-09-20 NOTE — Telephone Encounter (Signed)
 FYI Only or Action Required?: FYI only for provider.  Patient was last seen in primary care on 03/09/2023 by Cleatus Arlyss RAMAN, MD.  Called Nurse Triage reporting Leg Pain and Leg Swelling.  Symptoms began several days ago.  Interventions attempted: Ice/heat application and Other: elevate extremity.  Symptoms are: right toes/foot/ankle/calf swelling and pain gradually worsening.  Triage Disposition: See Physician Within 24 Hours (overriding See HCP Within 4 Hours (Or PCP Triage))  Patient/caregiver understands and will follow disposition?: Yes           Copied from CRM #[id number]. Topic: Clinical - Red Word Triage >> Sep 20, 2023  3:26 PM Robinson H wrote: Red Word that prompted transfer to Nurse Triage: Unilateral swelling one ankle right swelling to toes now, same ankle that has been injured before 10 years ago, pain started in ankle in calf now, has been limping around on it, everything started Sunday morning nothing happened was on her feet a lot Saturday but usually is. Reason for Disposition  [1] Thigh, calf, or ankle swelling AND [2] only 1 side  Answer Assessment - Initial Assessment Questions 1. ONSET: When did the swelling start? (e.g., minutes, hours, days)     Sunday. She states after standing around or through out the day the swelling gets worse.  2. LOCATION: What part of the leg is swollen?  Are both legs swollen or just one leg?     Right side, toes/foot/ankle/calf.  3. SEVERITY: How bad is the swelling? (e.g., localized; mild, moderate, severe)     Having difficulty wearing shoes  4. REDNESS: Is there redness or signs of infection?     No.  5. PAIN: Is the swelling painful to touch? If Yes, ask: How painful is it?   (Scale 1-10; mild, moderate or severe)     Yes, discomfort and tightness theres not any real pain that I can feel, I'd say a 2/10. She states the pain seems worse in the morning and worse when walking.  6. FEVER: Do you have a  fever? If Yes, ask: What is it, how was it measured, and when did it start?      No.  7. CAUSE: What do you think is causing the leg swelling?     She states she had a previous severe sprain in her right foot before. She denies any recent overuse or exercise. She states for her job she is on her feet a lot but she states nothing out of the ordinary.  8. MEDICAL HISTORY: Do you have a history of blood clots (e.g., DVT), cancer, heart failure, kidney disease, or liver failure?     No.  9. RECURRENT SYMPTOM: Have you had leg swelling before? If Yes, ask: When was the last time? What happened that time?     Yes, she states that time it did not last this long. She states it was earlier in the Spring of this year.  10. OTHER SYMPTOMS: Do you have any other symptoms? (e.g., chest pain, difficulty breathing)       Denies chest pain, SOB, increased salt intake.  11. PREGNANCY: Is there any chance you are pregnant? When was your last menstrual period?       Currently on her menstrual cycle, started last week.  Protocols used: Leg Swelling and Edema-A-AH

## 2023-09-20 NOTE — Telephone Encounter (Signed)
 Noted. Thanks.

## 2023-09-21 ENCOUNTER — Ambulatory Visit: Admitting: Nurse Practitioner

## 2023-09-21 ENCOUNTER — Encounter: Payer: Self-pay | Admitting: Nurse Practitioner

## 2023-09-21 ENCOUNTER — Ambulatory Visit

## 2023-09-21 VITALS — BP 126/84 | HR 90 | Temp 97.9°F | Ht 62.0 in | Wt 252.8 lb

## 2023-09-21 DIAGNOSIS — M25471 Effusion, right ankle: Secondary | ICD-10-CM | POA: Insufficient documentation

## 2023-09-21 DIAGNOSIS — M25571 Pain in right ankle and joints of right foot: Secondary | ICD-10-CM | POA: Diagnosis not present

## 2023-09-21 DIAGNOSIS — M7989 Other specified soft tissue disorders: Secondary | ICD-10-CM | POA: Diagnosis not present

## 2023-09-21 NOTE — Progress Notes (Signed)
 Established Patient Office Visit  Subjective:  Patient ID: Jessica Barron, female    DOB: 1980/02/06  Age: 43 y.o. MRN: [id number]  CC:  Chief Complaint  Patient presents with   Acute Visit    Right ankle swelling since Sunday Patient has iced and elevated right ankle   Discussed the use of a AI scribe software for clinical note transcription with the patient, who gave verbal consent to proceed.  HPI  Jessica Barron presents for unilateral swelling of right foot and ankle complaint by her spouse and 36-month-old son. She had history of right ankle injury in 2015.   They went for shopping to [place] outlets on Saturday and she woke up on Sunday with swelling and pain in her right ankle, aggravates with walking. The pain is located on the medial side of the ankle and extends into the arch of her foot, with tenderness in the calf. There is no redness or warmth to the ankle, foot or calf.  She denies any recent injury,twist or  fall. She sustained a severe ankle injury ten years ago, requiring an air boot and an injection, with occasional discomfort since then.   Current symptoms are more severe and persistent. Ice and elevation have not improved the swelling and pain. Her activities include significant time on her feet, such as helping with household tasks and caring for dogs.  Denies any shortness of breath, chest pain.   HPI   Past Medical History:  Diagnosis Date   Abnormal Pap smear    repeat WNL   Constipation 02/18/2018   Depression    FH: bladder cancer 02/18/2018   GERD (gastroesophageal reflux disease)    Hip pain 01/03/2021   History of PCOS    Hyperlipidemia 02/11/2016   Menorrhagia with regular cycle 01/26/2022   PCOS (polycystic ovarian syndrome) 09/11/2013   Perirectal abscess 07/19/2022   Raynaud's phenomenon 01/03/2021   Rosacea 01/01/2021   Supervision of high risk pregnancy, antepartum 05/18/2022          NURSING   PROVIDER  Office Location  [place]   Dating by  LMP c/w U/S at 7.3 wks  [facility] Model  Traditional  Anatomy U/S     Initiated care at   [employer]   English                 LAB RESULTS   Support Person  FOB-Bryan  Genetics  NIPS: LR M            AFP:    wnl                NT/IT (FT only)           Carrier Screen  Horizon: neg x 4  Rhogam   O/Positive/-- (05/28    Past Surgical History:  Procedure Laterality Date   CESAREAN SECTION  02/25/2011   Procedure: CESAREAN SECTION;  Surgeon: Lynwood FORBES Curlene PONCE, MD;  Location: [facility];  Service: Gynecology;  Laterality: N/A;   CESAREAN SECTION WITH BILATERAL TUBAL LIGATION N/A 11/23/2022   Procedure: CESAREAN SECTION WITH BILATERAL TUBAL LIGATION;  Surgeon: Cleatus Moccasin, MD;  Location: [facility] LD ORS;  Service: Obstetrics;  Laterality: N/A;   CHOLECYSTECTOMY      Family History  Problem Relation Age of Onset   Bladder Cancer Mother    COPD Mother    Cancer Mother  bladder   Bladder Cancer Father    Cancer Father        bladder   Breast cancer Paternal Grandmother    Anesthesia problems Neg Hx    Colon cancer Neg Hx     Social History   Socioeconomic History   Marital status: Significant Other    Spouse name: Not on file   Number of children: Not on file   Years of education: Not on file   Highest education level: Not on file  Occupational History   Not on file  Tobacco Use   Smoking status: Never    Passive exposure: Past   Smokeless tobacco: Never  Vaping Use   Vaping status: Never Used  Substance and Sexual Activity   Alcohol use: Not Currently    Comment: rare   Drug use: No   Sexual activity: Yes    Birth control/protection: None  Other Topics Concern   Not on file  Social History Narrative   Married 2010- separated from husband 2020.     1 daughter Talbert   1 son Ray   Social Drivers of Corporate investment banker Strain: Not on [employer] Insecurity: Not on file  Transportation Needs: Not on file  Physical Activity: Not on file   Stress: Not on file  Social Connections: Not on file  Intimate Partner Violence: Not on file     Outpatient Medications Prior to Visit  Medication Sig Dispense Refill   Cholecalciferol  (VITAMIN D ) 50 MCG (2000 UT) CAPS Take by mouth.     spironolactone  (ALDACTONE ) 50 MG tablet Take 50 mg by mouth daily.     cephALEXin  (KEFLEX ) 500 MG capsule Take 1 capsule (500 mg total) by mouth 4 (four) times daily. 20 capsule 0   diclofenac  Sodium (VOLTAREN ) 1 % GEL Apply 2 g topically 3 (three) times daily as needed. 100 g 1   No facility-administered medications prior to visit.    Allergies  Allergen Reactions   Lexapro  [Escitalopram  Oxalate] Other (See Comments)    Blunted affect   Wellbutrin  [Bupropion ] Other (See Comments)    intolerant    ROS Review of Systems Negative unless indicated in HPI.    Objective:    Physical Exam Musculoskeletal:     Right foot: Decreased range of motion. Swelling present.     Comments: Swelling on anterior foot and ankle.  Tenderness with plantarflexion.  Tenderness to the medial ankle     BP 126/84   Pulse 90   Temp 97.9 F (36.6 C)   Ht 5' 2 (1.575 m)   Wt 252 lb 12.8 oz (114.7 kg)   SpO2 99%   BMI 46.24 kg/m  Wt Readings from Last 3 Encounters:  09/21/23 252 lb 12.8 oz (114.7 kg)  03/09/23 249 lb 9.6 oz (113.2 kg)  02/24/23 249 lb 3.2 oz (113 kg)     Health Maintenance  Topic Date Due   Pneumococcal Vaccine (1 of 2 - PCV) Never done   Hepatitis B Vaccines 19-59 Average Risk (1 of 3 - 19+ 3-dose series) Never done   HPV VACCINES (1 - 3-dose SCDM series) Never done   INFLUENZA VACCINE  04/23/2024 (Originally 08/25/2023)   Cervical Cancer Screening (HPV/Pap Cotest)  06/21/2027   DTaP/Tdap/Td (3 - Td or Tdap) 10/25/2032   Hepatitis C Screening  Completed   HIV Screening  Completed   Meningococcal B Vaccine  Aged Out   COVID-19 Vaccine  Discontinued  Topic Date Due   Hepatitis B Vaccines 19-59 Average Risk (1 of 3 - 19+  3-dose series) Never done   HPV VACCINES (1 - 3-dose SCDM series) Never done    Lab Results  Component Value Date   TSH 2.18 02/22/2023   Lab Results  Component Value Date   WBC 9.2 02/22/2023   HGB 13.0 02/22/2023   HCT 40.3 02/22/2023   MCV 88.6 02/22/2023   PLT 365.0 02/22/2023   Lab Results  Component Value Date   NA 140 02/22/2023   K 4.5 02/22/2023   CO2 30 02/22/2023   GLUCOSE 99 02/22/2023   BUN 13 02/22/2023   CREATININE 0.71 02/22/2023   BILITOT 0.3 02/22/2023   ALKPHOS 51 02/22/2023   AST 14 02/22/2023   ALT 17 02/22/2023   PROT 6.9 02/22/2023   ALBUMIN 4.3 02/22/2023   CALCIUM 9.9 02/22/2023   ANIONGAP 8 11/22/2022   EGFR 114 06/21/2022   GFR 104.15 02/22/2023   Lab Results  Component Value Date   CHOL 259 (H) 02/22/2023   Lab Results  Component Value Date   HDL 55.50 02/22/2023   Lab Results  Component Value Date   LDLCALC 165 (H) 02/22/2023   Lab Results  Component Value Date   TRIG 194.0 (H) 02/22/2023   Lab Results  Component Value Date   CHOLHDL 5 02/22/2023   Lab Results  Component Value Date   HGBA1C 5.6 02/22/2023      Assessment & Plan:  Swollen R ankle Assessment & Plan: Acute right ankle pain and swelling with tenderness on the medial aspect. - Order right ankle x-ray. - Advise ice and elevation. - Recommend wrapping ankle with a bandage. - Suggest air boot for support during prolonged standing and walking. - Consider podiatry referral if pain not improving.  Orders: -     DG Ankle Complete Right; Future    Follow-up: No follow-ups on file.   Shaun Runyon, NP

## 2023-09-21 NOTE — Assessment & Plan Note (Signed)
 Acute right ankle pain and swelling with tenderness on the medial aspect. - Order right ankle x-ray. - Advise ice and elevation. - Recommend wrapping ankle with a bandage. - Suggest air boot for support during prolonged standing and walking. - Consider podiatry referral if pain not improving.

## 2023-09-22 ENCOUNTER — Encounter: Payer: Self-pay | Admitting: Family Medicine

## 2023-10-03 ENCOUNTER — Ambulatory Visit: Payer: Self-pay | Admitting: Nurse Practitioner

## 2023-10-03 ENCOUNTER — Ambulatory Visit: Admitting: Family Medicine

## 2023-10-03 ENCOUNTER — Ambulatory Visit: Attending: Family Medicine

## 2023-10-03 VITALS — BP 132/82 | HR 93 | Temp 98.6°F | Ht 62.0 in | Wt 253.2 lb

## 2023-10-03 DIAGNOSIS — N921 Excessive and frequent menstruation with irregular cycle: Secondary | ICD-10-CM

## 2023-10-03 DIAGNOSIS — E785 Hyperlipidemia, unspecified: Secondary | ICD-10-CM | POA: Diagnosis not present

## 2023-10-03 DIAGNOSIS — R000 Tachycardia, unspecified: Secondary | ICD-10-CM | POA: Diagnosis not present

## 2023-10-03 DIAGNOSIS — M25471 Effusion, right ankle: Secondary | ICD-10-CM | POA: Diagnosis not present

## 2023-10-03 DIAGNOSIS — G4730 Sleep apnea, unspecified: Secondary | ICD-10-CM | POA: Diagnosis not present

## 2023-10-03 NOTE — Progress Notes (Signed)
 Xray of right ankle normal. No acute changes.

## 2023-10-03 NOTE — Patient Instructions (Addendum)
 Go to the lab on the way out.   If you have mychart we'll likely use that to update you.    Take care.  Glad to see you. Let me know if you can't get set up with the monitor or pulmonary.

## 2023-10-03 NOTE — Progress Notes (Unsigned)
 She noted elevated heart rate when up and active.  Max heart rate in the 110s. Started during pregnancy, persisted since then.  No pain with sx but she has an uncomfortable sensation.  She can feel anxious with episodes.  No syncope. No BLE edema.    She had R ankle pain and swelling prev.  No specific trauma.  Sx resolved in the meantime except for R arch can get tender.  Not limping.  Not puffy now.    She has diffuse hot sensation, episodic.  Can last for hours, worse at night.  Started in distant past but worse recently, since birth of her son.    She was having shoulder knee and hip pain.  No hand pain.  No rash.    She has h/o raynaud's changes in the hands.   H/o irregular periods at baseline.  History of heavy menses.  H/o snoring, d/w pt about possible OSA eval.    Meds, vitals, and allergies reviewed.   ROS: Per HPI unless specifically indicated in ROS section   GEN: nad, alert and oriented HEENT: mucous membranes moist NECK: supple w/o LA CV: rrr. PULM: ctab, no inc wob ABD: soft, +bs EXT: no edema SKIN: no acute rash

## 2023-10-04 DIAGNOSIS — N921 Excessive and frequent menstruation with irregular cycle: Secondary | ICD-10-CM | POA: Insufficient documentation

## 2023-10-04 DIAGNOSIS — G4730 Sleep apnea, unspecified: Secondary | ICD-10-CM | POA: Insufficient documentation

## 2023-10-04 DIAGNOSIS — R000 Tachycardia, unspecified: Secondary | ICD-10-CM | POA: Insufficient documentation

## 2023-10-04 LAB — CBC WITH DIFFERENTIAL/PLATELET
Basophils Absolute: 0.1 K/uL (ref 0.0–0.1)
Basophils Relative: 0.8 % (ref 0.0–3.0)
Eosinophils Absolute: 0.1 K/uL (ref 0.0–0.7)
Eosinophils Relative: 1.3 % (ref 0.0–5.0)
HCT: 31.6 % — ABNORMAL LOW (ref 36.0–46.0)
Hemoglobin: 9.7 g/dL — ABNORMAL LOW (ref 12.0–15.0)
Lymphocytes Relative: 20.3 % (ref 12.0–46.0)
Lymphs Abs: 1.5 K/uL (ref 0.7–4.0)
MCHC: 30.7 g/dL (ref 30.0–36.0)
MCV: 73.5 fl — ABNORMAL LOW (ref 78.0–100.0)
Monocytes Absolute: 0.4 K/uL (ref 0.1–1.0)
Monocytes Relative: 4.9 % (ref 3.0–12.0)
Neutro Abs: 5.4 K/uL (ref 1.4–7.7)
Neutrophils Relative %: 72.7 % (ref 43.0–77.0)
Platelets: 457 K/uL — ABNORMAL HIGH (ref 150.0–400.0)
RBC: 4.3 Mil/uL (ref 3.87–5.11)
RDW: 17.9 % — ABNORMAL HIGH (ref 11.5–15.5)
WBC: 7.4 K/uL (ref 4.0–10.5)

## 2023-10-04 LAB — COMPREHENSIVE METABOLIC PANEL WITH GFR
ALT: 13 U/L (ref 0–35)
AST: 14 U/L (ref 0–37)
Albumin: 4.5 g/dL (ref 3.5–5.2)
Alkaline Phosphatase: 43 U/L (ref 39–117)
BUN: 11 mg/dL (ref 6–23)
CO2: 28 meq/L (ref 19–32)
Calcium: 9.4 mg/dL (ref 8.4–10.5)
Chloride: 101 meq/L (ref 96–112)
Creatinine, Ser: 0.76 mg/dL (ref 0.40–1.20)
GFR: 95.57 mL/min (ref >60.00)
Glucose, Bld: 72 mg/dL (ref 70–99)
Potassium: 4.1 meq/L (ref 3.5–5.1)
Sodium: 138 meq/L (ref 135–145)
Total Bilirubin: 0.5 mg/dL (ref 0.2–1.2)
Total Protein: 7.1 g/dL (ref 6.0–8.3)

## 2023-10-04 LAB — FOLLICLE STIMULATING HORMONE: FSH: 4.2 m[IU]/mL

## 2023-10-04 LAB — LIPID PANEL
Cholesterol: 204 mg/dL — ABNORMAL HIGH (ref 0–200)
HDL: 47.8 mg/dL (ref >39.00)
LDL Cholesterol: 128 mg/dL — ABNORMAL HIGH (ref 0–99)
NonHDL: 156.25
Total CHOL/HDL Ratio: 4
Triglycerides: 143 mg/dL (ref 0.0–149.0)
VLDL: 28.6 mg/dL (ref 0.0–40.0)

## 2023-10-04 LAB — TSH: TSH: 1.39 u[IU]/mL (ref 0.35–5.50)

## 2023-10-04 LAB — FERRITIN: Ferritin: 7.8 ng/mL — ABNORMAL LOW (ref 10.0–291.0)

## 2023-10-04 NOTE — Assessment & Plan Note (Signed)
 Of unclear source.  Discussed that I would update cardiology.  Ordered ambulatory monitor.  See notes on labs.  Unclear if this is exacerbated by underlying sleep apnea.

## 2023-10-04 NOTE — Assessment & Plan Note (Signed)
 History of.  Refer to pulmonary.

## 2023-10-04 NOTE — Assessment & Plan Note (Signed)
 Fortunately this improved in the meantime.

## 2023-10-04 NOTE — Assessment & Plan Note (Signed)
 History of heavy menses.  See notes on labs.

## 2023-10-06 ENCOUNTER — Ambulatory Visit: Admitting: Cardiology

## 2023-10-08 ENCOUNTER — Ambulatory Visit: Payer: Self-pay | Admitting: Family Medicine

## 2023-10-08 DIAGNOSIS — D649 Anemia, unspecified: Secondary | ICD-10-CM

## 2023-10-08 MED ORDER — MULTI-VITAMIN/MINERALS PO TABS: 1.0000 | ORAL_TABLET | Freq: Every day | ORAL | Status: AC

## 2023-10-08 MED ORDER — IRON (FERROUS SULFATE) 325 (65 FE) MG PO TABS
325.0000 mg | ORAL_TABLET | Freq: Every day | ORAL | Status: AC
Start: 2023-10-08 — End: ?

## 2023-10-10 ENCOUNTER — Ambulatory Visit: Admitting: Sleep Medicine

## 2023-10-10 NOTE — Progress Notes (Deleted)
 Cardiology Office Note    Date:  10/10/2023  ID:  Augustine Brannick, DOB 1980-12-11, MRN [id number] PCP:  Cleatus Arlyss RAMAN, MD  Cardiologist:  None  Electrophysiologist:  None   Chief Complaint: ***  History of Present Illness: .    Jessica Barron is a 43 y.o. female with visit-pertinent history of Raynaud's syndrome, GERD, anxiety, hyperlipidemia and obesity.  Patient was first evaluated by Dr. Aloysius on 6//24 at the request of her PCP for evaluation of cardiovascular care and pregnancy.  Patient reported palpitations that were intermittent and short-lived.  Cardiac monitor was ordered and not completed.  Echocardiogram on 08/03/2022 indicated LVEF 60 to 65%, no RWMA, diastolic primaries were normal, RV systolic function and size were normal, no significant valvular abnormalities.  Cardiac monitor was not completed.  Today patient presents regarding  Palpitations:     Labwork independently reviewed:   ROS: .   *** denies chest pain, shortness of breath, lower extremity edema, fatigue, palpitations, melena, hematuria, hemoptysis, diaphoresis, weakness, presyncope, syncope, orthopnea, and PND.  All other systems are reviewed and otherwise negative.  Studies Reviewed: SABRA    EKG:  EKG is ordered today, personally reviewed, demonstrating ***     CV Studies: Cardiac studies reviewed are outlined and summarized above. Otherwise please see EMR for full report. Cardiac Studies & Procedures   ______________________________________________________________________________________________     ECHOCARDIOGRAM  ECHOCARDIOGRAM COMPLETE 08/03/2022  Narrative ECHOCARDIOGRAM REPORT    Patient Name:   Jessica Barron Date of Exam: 08/03/2022 Medical Rec #:  [id number]       Height:       62.0 in Accession #:    [id number]      Weight:       237.0 lb Date of Birth:  [birth year]        BSA:          2.055 m Patient Age:    41 years        BP:           130/83 mmHg Patient Gender: F                HR:           91 bpm. Exam Location:  [address]  Procedure: 2D Echo, Cardiac Doppler, Color Doppler and 3D Echo  Indications:    OB Patient; Raynaud's Syndrome I73  History:        Patient has no prior history of Echocardiogram examinations. Risk Factors:Dyslipidemia.  Sonographer:    Augustin Seals RDCS Referring Phys: [id number] KARDIE TOBB   Sonographer Comments: Global longitudinal strain was attempted. IMPRESSIONS   1. Left ventricular ejection fraction, by estimation, is 60 to 65%. The left ventricle has normal function. The left ventricle has no regional wall motion abnormalities. Left ventricular diastolic parameters were normal. 2. Right ventricular systolic function is normal. The right ventricular size is normal. 3. The mitral valve is normal in structure. No evidence of mitral valve regurgitation. 4. The aortic valve is grossly normal. Aortic valve regurgitation is not visualized. 5. The inferior vena cava is normal in size with <50% respiratory variability, suggesting right atrial pressure of 8 mmHg.  FINDINGS Left Ventricle: Left ventricular ejection fraction, by estimation, is 60 to 65%. The left ventricle has normal function. The left ventricle has no regional wall motion abnormalities. The left ventricular internal cavity size was normal in size. There is no left ventricular hypertrophy. Left ventricular diastolic parameters were normal.  Right Ventricle: The right ventricular  size is normal. Right ventricular systolic function is normal.  Left Atrium: Left atrial size was normal in size.  Right Atrium: Right atrial size was normal in size.  Pericardium: There is no evidence of pericardial effusion.  Mitral Valve: The mitral valve is normal in structure. No evidence of mitral valve regurgitation.  Tricuspid Valve: Tricuspid valve regurgitation is not demonstrated.  Aortic Valve: The aortic valve is grossly normal. Aortic valve regurgitation is not  visualized.  Pulmonic Valve: The pulmonic valve was not well visualized. Pulmonic valve regurgitation is not visualized.  Aorta: The aortic root and ascending aorta are structurally normal, with no evidence of dilitation.  Venous: The inferior vena cava is normal in size with less than 50% respiratory variability, suggesting right atrial pressure of 8 mmHg.  IAS/Shunts: No atrial level shunt detected by color flow Doppler.   LEFT VENTRICLE PLAX 2D LVIDd:         4.60 cm   Diastology LVIDs:         3.40 cm   LV e' medial:    8.92 cm/s LV PW:         1.00 cm   LV E/e' medial:  8.2 LV IVS:        1.00 cm   LV e' lateral:   12.80 cm/s LVOT diam:     2.00 cm   LV E/e' lateral: 5.7 LV SV:         50 LV SV Index:   24 LVOT Area:     3.14 cm  3D Volume EF: 3D EF:        60 % LV EDV:       170 ml LV ESV:       68 ml LV SV:        102 ml  RIGHT VENTRICLE RV Basal diam:  3.20 cm RV Mid diam:    3.10 cm RV S prime:     12.30 cm/s TAPSE (M-mode): 2.4 cm  LEFT ATRIUM             Index        RIGHT ATRIUM           Index LA diam:        3.90 cm 1.90 cm/m   RA Area:     12.30 cm LA Vol (A2C):   44.1 ml 21.46 ml/m  RA Volume:   27.30 ml  13.29 ml/m LA Vol (A4C):   68.7 ml 33.44 ml/m LA Biplane Vol: 56.3 ml 27.40 ml/m AORTIC VALVE LVOT Vmax:   92.40 cm/s LVOT Vmean:  59.100 cm/s LVOT VTI:    0.160 m  AORTA Ao Root diam: 2.30 cm Ao Asc diam:  2.90 cm  MITRAL VALVE MV Area (PHT): 3.89 cm    SHUNTS MV Decel Time: 195 msec    Systemic VTI:  0.16 m MV E velocity: 73.10 cm/s  Systemic Diam: 2.00 cm MV A velocity: 59.10 cm/s MV E/A ratio:  1.24  Photographer signed by Ronal Ross Signature Date/Time: 08/03/2022/11:43:29 AM    Final          ______________________________________________________________________________________________       Current Reported Medications:.    No outpatient medications have been marked as taking for the 10/12/23  encounter (Appointment) with Anjani Feuerborn D, NP.    Physical Exam:    VS:  There were no vitals taken for this visit.   Wt Readings from Last 3 Encounters:  10/03/23 253 lb 3.2  oz (114.9 kg)  09/21/23 252 lb 12.8 oz (114.7 kg)  03/09/23 249 lb 9.6 oz (113.2 kg)    GEN: Well nourished, well developed in no acute distress NECK: No JVD; No carotid bruits CARDIAC: ***RRR, no murmurs, rubs, gallops RESPIRATORY:  Clear to auscultation without rales, wheezing or rhonchi  ABDOMEN: Soft, non-tender, non-distended EXTREMITIES:  No edema; No acute deformity     Asessement and Plan:.     ***     Disposition: F/u with ***  Signed, Hagen Bohorquez D Cathyann Kilfoyle, NP

## 2023-10-12 ENCOUNTER — Ambulatory Visit: Admitting: Cardiology

## 2023-10-12 DIAGNOSIS — R002 Palpitations: Secondary | ICD-10-CM

## 2023-10-12 DIAGNOSIS — R000 Tachycardia, unspecified: Secondary | ICD-10-CM

## 2023-10-17 ENCOUNTER — Ambulatory Visit (INDEPENDENT_AMBULATORY_CARE_PROVIDER_SITE_OTHER): Admitting: Sleep Medicine

## 2023-10-17 ENCOUNTER — Encounter: Payer: Self-pay | Admitting: Sleep Medicine

## 2023-10-17 VITALS — BP 118/78 | HR 95 | Temp 97.5°F | Ht 62.0 in | Wt 248.8 lb

## 2023-10-17 DIAGNOSIS — Z6841 Body Mass Index (BMI) 40.0 and over, adult: Secondary | ICD-10-CM | POA: Diagnosis not present

## 2023-10-17 DIAGNOSIS — G4733 Obstructive sleep apnea (adult) (pediatric): Secondary | ICD-10-CM

## 2023-10-17 DIAGNOSIS — G4710 Hypersomnia, unspecified: Secondary | ICD-10-CM

## 2023-10-17 NOTE — Progress Notes (Signed)
 Name:Jessica Barron MRN: [id number] DOB: [birth year]   CHIEF COMPLAINT:  EXCESSIVE DAYTIME SLEEPINESS   HISTORY OF PRESENT ILLNESS: Jessica Barron is a 43 y.o. w/ a h/o morbid obesity and PCOS who presents for c/o loud snoring and excessive daytime sleepiness which has been present for several years. Reports nocturnal awakenings due to unclear reasons and has difficulty falling back to sleep. Reports a 70 lb weight gain. Denies morning headaches, RLS symptoms, dream enactment, cataplexy, hypnagogic or hypnapompic hallucinations. Reports a family history of sleep apnea. Denies drowsy driving. Drinks 1 cup of coffee and 1-2 glasses of tea daily, occasional alcohol use, denies tobacco or illicit drug use.   Bedtime 9-11 pm Sleep onset 15-30 mins Rise time 7:30-8 am   EPWORTH SLEEP SCORE 3    10/02/2017    3:00 PM  Results of the Epworth flowsheet  Sitting and reading 0  Watching TV 0  Sitting, inactive in a public place (e.g. a theatre or a meeting) 0  As a passenger in a car for an hour without a break 1  Lying down to rest in the afternoon when circumstances permit 2  Sitting and talking to someone 0  Sitting quietly after a lunch without alcohol 0  In a car, while stopped for a few minutes in traffic 0  Total score 3    PAST MEDICAL HISTORY :   has a past medical history of Abnormal Pap smear, Constipation (02/18/2018), Depression, FH: bladder cancer (02/18/2018), GERD (gastroesophageal reflux disease), Hip pain (01/03/2021), History of PCOS, Hyperlipidemia (02/11/2016), Menorrhagia with regular cycle (01/26/2022), PCOS (polycystic ovarian syndrome) (09/11/2013), Perirectal abscess (07/19/2022), Raynaud's phenomenon (01/03/2021), Rosacea (01/01/2021), and Supervision of high risk pregnancy, antepartum (05/18/2022).  has a past surgical history that includes Cholecystectomy; Cesarean section (02/25/2011); and Cesarean section with bilateral tubal ligation (N/A, 11/23/2022). Prior  to Admission medications   Medication Sig Start Date End Date Taking? Authorizing Provider  Cholecalciferol  (VITAMIN D ) 50 MCG (2000 UT) CAPS Take by mouth.    [provider]  Iron , Ferrous Sulfate , 325 (65 Fe) MG TABS Take 325 mg by mouth daily. 10/08/23   Jessica Arlyss RAMAN, MD  Multiple Vitamins-Minerals (MULTIVITAMIN WITH MINERALS) tablet Take 1 tablet by mouth daily. 10/08/23   Jessica Arlyss RAMAN, MD  spironolactone  (ALDACTONE ) 50 MG tablet Take 50 mg by mouth daily. 01/21/23   [provider]   Allergies  Allergen Reactions   Lexapro  [Escitalopram  Oxalate] Other (See Comments)    Blunted affect   Wellbutrin  [Bupropion ] Other (See Comments)    intolerant    FAMILY HISTORY:  family history includes Bladder Cancer in her father and mother; Breast cancer in her paternal grandmother; COPD in her mother; Cancer in her father and mother. SOCIAL HISTORY:  reports that she has never smoked. She has been exposed to tobacco smoke. She has never used smokeless tobacco. She reports that she does not currently use alcohol. She reports that she does not use drugs.   Review of Systems:  Gen:  Denies  fever, sweats, chills weight loss  HEENT: Denies blurred vision, double vision, ear pain, eye pain, hearing loss, nose bleeds, sore throat Cardiac:  No dizziness, chest pain or heaviness, chest tightness,edema, No JVD Resp:   No cough, -sputum production, -shortness of breath,-wheezing, -hemoptysis,  Gi: Denies swallowing difficulty, stomach pain, nausea or vomiting, diarrhea, constipation, bowel incontinence Gu:  Denies bladder incontinence, burning urine Ext:   Denies Joint pain, stiffness or swelling Skin: Denies  skin rash, easy bruising or bleeding or hives Endoc:  Denies polyuria, polydipsia , polyphagia or weight change Psych:   Denies depression, insomnia or hallucinations  Other:  All other systems negative  VITAL SIGNS: BP 118/78   Pulse 95   Temp (!) 97.5 F (36.4 C)    Ht 5' 2 (1.575 m)   Wt 248 lb 12.8 oz (112.9 kg)   SpO2 97%   BMI 45.51 kg/m     Physical Examination:   General Appearance: No distress  EYES PERRLA, EOM intact.   NECK Supple, No JVD Pulmonary: normal breath sounds, No wheezing.  CardiovascularNormal S1,S2.  No m/r/g.   Abdomen: Benign, Soft, non-tender. Skin:   warm, no rashes, no ecchymosis  Extremities: normal, no cyanosis, clubbing. Neuro:without focal findings,  speech normal  PSYCHIATRIC: Mood, affect within normal limits.   ASSESSMENT AND PLAN  OSA I suspect that OSA is likely present due to clinical presentation. Discussed the consequences of untreated sleep apnea. Advised not to drive drowsy for safety of patient and others. Will complete further evaluation with a home sleep study and follow up to review results.    Morbid obesity Counseled patient on diet and lifestyle modification.    MEDICATION ADJUSTMENTS/LABS AND TESTS ORDERED: Recommend Sleep Study   Patient  satisfied with Plan of action and management. All questions answered  Follow up to review HST results and treatment plan.   I spent a total of 50 minutes reviewing chart data, face-to-face evaluation with the patient, counseling and coordination of care as detailed above.    Jessica Barron, M.D.  Sleep Medicine [facility]

## 2023-10-17 NOTE — Patient Instructions (Signed)
 SABRA

## 2023-10-18 ENCOUNTER — Encounter: Payer: Self-pay | Admitting: Family Medicine

## 2023-10-26 DIAGNOSIS — R000 Tachycardia, unspecified: Secondary | ICD-10-CM | POA: Diagnosis not present

## 2023-11-02 ENCOUNTER — Other Ambulatory Visit

## 2023-12-02 ENCOUNTER — Other Ambulatory Visit: Payer: Self-pay | Admitting: Family Medicine

## 2023-12-02 DIAGNOSIS — O165 Unspecified maternal hypertension, complicating the puerperium: Secondary | ICD-10-CM

## 2023-12-04 NOTE — Telephone Encounter (Signed)
 Spironolactone  Last filled:  01/21/23 Last OV:  10/03/23, f/u Next OV:  none

## 2024-01-09 ENCOUNTER — Ambulatory Visit: Admitting: Cardiology

## 2024-02-01 ENCOUNTER — Encounter: Payer: Self-pay | Admitting: Obstetrics and Gynecology
